# Patient Record
Sex: Female | Born: 1955
Health system: Southern US, Community
[De-identification: ages and names within clinical notes are randomized; demographics above are authoritative.]

## PROBLEM LIST (undated history)

## (undated) DIAGNOSIS — I10 Essential (primary) hypertension: Secondary | ICD-10-CM

## (undated) DIAGNOSIS — F419 Anxiety disorder, unspecified: Secondary | ICD-10-CM

## (undated) DIAGNOSIS — E785 Hyperlipidemia, unspecified: Secondary | ICD-10-CM

## (undated) DIAGNOSIS — R1310 Dysphagia, unspecified: Secondary | ICD-10-CM

## (undated) DIAGNOSIS — E781 Pure hyperglyceridemia: Secondary | ICD-10-CM

## (undated) DIAGNOSIS — T7840XA Allergy, unspecified, initial encounter: Secondary | ICD-10-CM

## (undated) DIAGNOSIS — I2510 Atherosclerotic heart disease of native coronary artery without angina pectoris: Secondary | ICD-10-CM

## (undated) DIAGNOSIS — R490 Dysphonia: Secondary | ICD-10-CM

## (undated) DIAGNOSIS — R9439 Abnormal result of other cardiovascular function study: Secondary | ICD-10-CM

## (undated) DIAGNOSIS — C801 Malignant (primary) neoplasm, unspecified: Secondary | ICD-10-CM

## (undated) HISTORY — PX: OTHER SURGICAL HISTORY: SHX169

## (undated) HISTORY — DX: Malignant (primary) neoplasm, unspecified: C80.1

## (undated) HISTORY — DX: Atherosclerotic heart disease of native coronary artery without angina pectoris: I25.10

## (undated) HISTORY — DX: Hyperlipidemia, unspecified: E78.5

## (undated) HISTORY — DX: Pure hyperglyceridemia: E78.1

## (undated) HISTORY — PX: COLON RESECTION: SHX5231

## (undated) HISTORY — PX: TONSILLECTOMY: SUR1361

## (undated) HISTORY — PX: COLONOSCOPY: SHX174

## (undated) HISTORY — DX: Dysphagia, unspecified: R13.10

## (undated) HISTORY — PX: TOTAL ABDOMINAL HYSTERECTOMY: SHX209

## (undated) HISTORY — DX: Dysphonia: R49.0

## (undated) HISTORY — DX: Anxiety disorder, unspecified: F41.9

## (undated) HISTORY — PX: KNEE SURGERY: SHX244

## (undated) HISTORY — DX: Allergy, unspecified, initial encounter: T78.40XA

## (undated) HISTORY — DX: Abnormal result of other cardiovascular function study: R94.39

## (undated) HISTORY — PX: VAGINAL HYSTERECTOMY: SUR661

---

## 1997-09-20 ENCOUNTER — Other Ambulatory Visit: Admission: RE | Admit: 1997-09-20 | Discharge: 1997-09-20 | Payer: Self-pay | Admitting: *Deleted

## 1998-07-25 ENCOUNTER — Other Ambulatory Visit: Admission: RE | Admit: 1998-07-25 | Discharge: 1998-07-25 | Payer: Self-pay | Admitting: *Deleted

## 2001-05-17 ENCOUNTER — Encounter: Admission: RE | Admit: 2001-05-17 | Discharge: 2001-06-13 | Payer: Self-pay | Admitting: Orthopedic Surgery

## 2001-10-26 ENCOUNTER — Other Ambulatory Visit: Admission: RE | Admit: 2001-10-26 | Discharge: 2001-10-26 | Payer: Self-pay | Admitting: *Deleted

## 2002-04-07 ENCOUNTER — Encounter: Payer: Self-pay | Admitting: *Deleted

## 2002-04-13 ENCOUNTER — Encounter (INDEPENDENT_AMBULATORY_CARE_PROVIDER_SITE_OTHER): Payer: Self-pay | Admitting: Specialist

## 2002-04-13 ENCOUNTER — Observation Stay (HOSPITAL_COMMUNITY): Admission: RE | Admit: 2002-04-13 | Discharge: 2002-04-14 | Payer: Self-pay | Admitting: *Deleted

## 2002-11-06 ENCOUNTER — Other Ambulatory Visit: Admission: RE | Admit: 2002-11-06 | Discharge: 2002-11-06 | Payer: Self-pay | Admitting: *Deleted

## 2003-11-07 ENCOUNTER — Other Ambulatory Visit: Admission: RE | Admit: 2003-11-07 | Discharge: 2003-11-07 | Payer: Self-pay | Admitting: *Deleted

## 2004-12-10 ENCOUNTER — Other Ambulatory Visit: Admission: RE | Admit: 2004-12-10 | Discharge: 2004-12-10 | Payer: Self-pay | Admitting: *Deleted

## 2008-02-24 ENCOUNTER — Ambulatory Visit (HOSPITAL_COMMUNITY): Admission: RE | Admit: 2008-02-24 | Discharge: 2008-02-24 | Payer: Self-pay | Admitting: Family Medicine

## 2010-06-22 ENCOUNTER — Encounter: Payer: Self-pay | Admitting: Family Medicine

## 2010-10-17 NOTE — Discharge Summary (Signed)
   NAMEDEDRA, [name]                           ACCOUNT NO.:  [id number]   MEDICAL RECORD NO.:  [id number]                   PATIENT TYPE:  OBV   LOCATION:  0452                                 FACILITY:  [facility]   PHYSICIAN:  Almedia Balls. Fore, M.D.                DATE OF BIRTH:  1955-07-25   DATE OF ADMISSION:  04/13/2002  DATE OF DISCHARGE:  04/14/2002                                 DISCHARGE SUMMARY   HISTORY:  The patient is a 55 year old with abnormal uterine bleeding,  pelvic adhesions, history of endometriosis, left tubal cyst for hysterectomy  and indicated procedures on April 13, 2002.  The remainder of her history  and physical are as previously dictated.   LABORATORY DATA:  Include preoperative hemoglobin 14.1, 10,200 white blood  cells with normal differential.  Chest x-ray showed chronic bronchitis  changes.   HOSPITAL COURSE:  The patient was taken to the operating room on November 13  at which time abdominal supracervical hysterectomy, extensive lysis of  adhesions, left salpingectomy, and destruction of right ovarian cysts were  performed.  The patient did well postoperatively.  Diet and ambulation were  progressed over the evening of November 13 and early morning of November 14.  On the morning of November 14 she was afebrile, having had a slight  temperature elevation in the range of 100 degrees in the early a.m., and  experiencing no problems except for pain which was controlled with  analgesics and it was felt that she could be discharged at this time.   FINAL DIAGNOSES:  1. Abnormal uterine bleeding.  2. Uterine enlargement.  3. Pelvic adhesions.  4. Ovarian cysts.  5. Left hydrosalpinx.  6. History of endometriosis.   OPERATION:  1. Abdominal supracervical hysterectomy.  2. Left salpingectomy.  3. Extensive lysis of adhesions.  4. Destruction of right ovarian cysts.   PATHOLOGY:  Report unavailable at time of dictation.   DISPOSITION:  Discharged  home to return to the office in two weeks for  follow-up.  She was instructed to gradually progress her activities over  several weeks at home and to limit lifting and driving for two weeks.  She  was given prescriptions for [facility] number 30 to be taken one or two  q.4h. p.r.n. pain and doxycycline 100 mg number 12 to use one b.i.d.  She  will call for any problems.                                               Almedia Balls. Randell Patient, M.D.    SRF/MEDQ  D:  04/14/2002  T:  04/14/2002  Job:  [id number]

## 2010-10-17 NOTE — H&P (Signed)
[name], [name]                           ACCOUNT NO.:  [id number]   MEDICAL RECORD NO.:  [id number]                   PATIENT TYPE:  INP   LOCATION:  NA                                   FACILITY:  [facility]   PHYSICIAN:  Almedia Balls. Fore, M.D.                DATE OF BIRTH:  07/05/1955   DATE OF ADMISSION:  04/13/2002  DATE OF DISCHARGE:                                HISTORY & PHYSICAL   CHIEF COMPLAINT:  Abnormal bleeding, pain and uterine enlargement.   HISTORY OF PRESENT ILLNESS:  The patient is a 55 year old with the above-  noted problems who has been counseled as to the need for surgery to treat  these problems.  She has undergone hysteroscopy, D&C, laparoscopy in  September 2003, with ovarian cysts, uterine fibroids and endometriosis.  Endometrial curettings were benign.  Pap smear was normal in May 2003.  The  patient has been counseled fully as to the nature of the procedure and the  risks involved included risks of anesthesia, injury to bowel, bladder, blood  vessels, ureters, postoperative hemorrhage, infection, recuperation and  possible hormone therapies should her ovaries be removed.  She fully  understands all these considerations and wishes to proceed on April 13, 2002.   PAST MEDICAL HISTORY:  Includes the above-noted problems with tubal ligation  in the past.   MEDICATIONS:  She has taken only analgesics and occasional progestational  agents to improve her bleeding patterns.   ALLERGIES:  No known drug allergies.   SOCIAL HISTORY:  She smokes approximately one pack of cigarettes per day.  She drinks occasional alcohol.   FAMILY HISTORY:  Noncontributory.   REVIEW OF SYMPTOMS:  HEENT:  Wears glasses.  CARDIORESPIRATORY:  Negative.  GASTROINTESTINAL:  Negative.  GENITOURINARY:  As in history of present  illness.  NEUROMUSCULAR:  Negative.   PHYSICAL EXAMINATION:  VITAL SIGNS:  Height 5 feet 3 inches, weight 154  pounds.  Blood pressure 120/76, pulse  76, respirations 18.  GENERAL:  Well-developed, white female in no acute distress.  HEENT:  Within normal limits.  NECK:  Supple without masses, adenopathy or bruits.  HEART:  Regular rate and rhythm without murmurs.  LUNGS:  Slightly coarse, but no definite rales or rhonchi noted.  BREASTS:  Sitting and lying without mass.  Axilla negative.  ABDOMEN:  Soft with no mass or tenderness.  PELVIC:  EG/BUS within normal limits.  Cervix is slightly inflamed.  Uterus  is enlarged to approximately 10 weeks' gestational size and irregular.  There are no palpable adnexal masses.  Anterior and posterior cul-de-sac  exam was confirmatory.  EXTREMITIES:  Within normal limits.  NEUROLOGIC:  Central nervous system grossly intact.  SKIN:  Without suspicious lesions.   IMPRESSION:  1. Abnormal uterine bleeding.  2. Uterine enlargement, questionable secondary to myomata.  3. Pelvic pain.  4. Endometriosis.   PLAN:  As  noted above.                                                Almedia Balls. Randell Patient, M.D.    SRF/MEDQ  D:  04/10/2002  T:  04/10/2002  Job:  [id number]

## 2010-10-17 NOTE — Op Note (Signed)
[name], [name]                           ACCOUNT NO.:  [id number]   MEDICAL RECORD NO.:  [id number]                   PATIENT TYPE:  INP   LOCATION:  NA                                   FACILITY:  [facility]   PHYSICIAN:  Almedia Balls. Fore, M.D.                DATE OF BIRTH:  07-18-1955   DATE OF PROCEDURE:  04/13/2002  DATE OF DISCHARGE:                                 OPERATIVE REPORT   PREOPERATIVE DIAGNOSES:  1. Abnormal uterine bleeding.  2. Pelvic pain.  3. Uterine enlargement.  4. Left hydrosalpinx.  5. Pelvic adhesions.  6. Question endometriosis.   POSTOPERATIVE DIAGNOSES:  1. Abnormal uterine bleeding.  2. Pelvic pain.  3. Uterine enlargement.  4. Left hydrosalpinx.  5. Pelvic adhesions.  6. Question endometriosis.   Pending pathology.   OPERATION:  1. Abdominal supracervical hysterectomy  2. Left salpingectomy.  3. Lysis of adhesions.   ANESTHESIA:  General oral tracheal.   SURGEON:  Almedia Balls. Randell Patient, M.D.   FIRST ASSISTANT:  Leatha Gilding. Mezer, M.D.   INDICATIONS FOR PROCEDURE:  The patient is a 55 year old with the above  noted problems who was counselled as to the need for surgery to treat these  problems. She was fully counselled as to the type of surgery to be performed  and risks involved to include risks of anesthesia, injury to bowel, bladder,  blood vessels, ureters, postoperative hemorrhage, infection, and  recuperation and possible use of hormone replacement should her ovaries be  removed. She fully understands all these considerations and wishes to  proceed on April 13, 2002.   OPERATIVE FINDINGS:  On entry into the abdomen, the uterus was enlarged to  approximately 8-[redacted] weeks gestational size with multiple myomata present.  There was a large hydrosalpinx involving the left tube.  There were  adhesions involving the left adnexal structures in the descending  rectosigmoid. The right adnexal structures were likewise adherent to the  posterolateral peritoneal surface.   DESCRIPTION OF PROCEDURE:  With the patient under general anesthesia,  prepared and draped in the usual sterile fashion, with a Foley catheter in  the bladder, a lower abdominal transverse incision was made and carried into  the peritoneal cavity. A self retaining retractor was placed. Exploration of  the upper abdomen revealed all of the upper abdominal viscera to be normal  to palpation. The bowel was then packed off, and Kelly clamps were used to  clamp the uterine ovarian anastomoses, tubes and round ligaments  bilaterally. Both round ligaments were transected using Bovie  electrocoagulation for the development of a bladder flap anteriorly. The  bladder flap was quite adherent to the lower uterine segment and cervix felt  to be secondary to probable adenomyosis. The uterine ovarian anastomosis  bilaterally was then clamped, cut, and doubly ligated with #1 chromic  catgut. The left tube was excised using Bovie electrocoagulation. Adhesions  were  lysed using Bovie electrocoagulation as well with good hemostasis. Care  was taken to avoid other viscera to include bowel and ureter in the area of  the adhesions. The uterine vessels were then skeletonized, and Heaney clamps  were used to clamp these structures. They were then cut and suture ligated  with #1 chromic catgut. The bladder was advanced over the lower uterine  segment and cervix, and Heaney clamps were used to clamp the cardinal  ligaments bilaterally which were then cut and suture ligated with #1 chromic  catgut. It was then possible to excise the uterine fundus using  Bovie  electrocoagulation to do a reverse conization of the cervix to remove the  majority of the endocervical canal. The remaining portion of the  endocervical canal was likewise rendered hemostatic with Bovie  electrocoagulation. Interrupted figure-of-eight sutures were used to  reapproximate the cervical stump with good  hemostasis. The area was then  lavaged with copious amounts of lactated Ringer's solution, and after noting  that hemostasis was maintained in the operative area, the area was  reperitonealized with an interrupted suture of #1 chromic catgut. The  ovaries were suspended from the round ligaments bilaterally. Again after  noting that hemostasis was maintained and that sponge and instrument counts  were correct, the peritoneum was closed with a continuous suture of #0  Vicryl. The fascia was closed with two sutures of #0 Vicryl which were  brought from the lateral aspects of the incision and tied separately in the  midline. The subcutaneous fat was reapproximated with interrupted sutures of  #0 Vicryl. The skin was closed with a subcuticular suture of 3-0 plain  catgut. Estimated blood loss 150 ml. The patient was taken to the recovery  room in good condition with clear urine and a Foley catheter tubing. She  will be placed on 23 hour observation following surgery.                                                Almedia Balls. Randell Patient, M.D.    SRF/MEDQ  D:  04/13/2002  T:  04/13/2002  Job:  [id number]   cc:   Leatha Gilding. Mezer, M.D.  [address]  [place]  [place] [address]  Fax: [phone]

## 2011-06-02 DIAGNOSIS — C801 Malignant (primary) neoplasm, unspecified: Secondary | ICD-10-CM

## 2011-06-02 HISTORY — DX: Malignant (primary) neoplasm, unspecified: C80.1

## 2012-02-23 ENCOUNTER — Other Ambulatory Visit: Payer: Self-pay | Admitting: Otolaryngology

## 2012-02-23 DIAGNOSIS — D380 Neoplasm of uncertain behavior of larynx: Secondary | ICD-10-CM

## 2012-02-24 ENCOUNTER — Other Ambulatory Visit: Payer: Self-pay | Admitting: Oncology

## 2012-02-24 ENCOUNTER — Ambulatory Visit
Admission: RE | Admit: 2012-02-24 | Discharge: 2012-02-24 | Disposition: A | Discharge status: Home / Self Care | Payer: Managed Care, Other (non HMO) | Source: Ambulatory Visit | Attending: Otolaryngology | Admitting: Otolaryngology

## 2012-02-24 DIAGNOSIS — D380 Neoplasm of uncertain behavior of larynx: Secondary | ICD-10-CM

## 2012-02-24 MED ORDER — IOHEXOL 300 MG/ML  SOLN
75.0000 mL | Freq: Once | INTRAMUSCULAR | Status: AC | PRN
  Administered 2012-02-24: 75 mL via INTRAVENOUS

## 2012-02-25 ENCOUNTER — Other Ambulatory Visit: Payer: Self-pay

## 2012-02-25 DIAGNOSIS — C329 Malignant neoplasm of larynx, unspecified: Secondary | ICD-10-CM | POA: Insufficient documentation

## 2012-02-25 DIAGNOSIS — C801 Malignant (primary) neoplasm, unspecified: Secondary | ICD-10-CM

## 2012-02-25 HISTORY — DX: Malignant (primary) neoplasm, unspecified: C80.1

## 2012-03-02 NOTE — Progress Notes (Signed)
Received office notes from Dr. Karol Wolicki @ [facility]; forwarded to Dr. Ha. 

## 2012-03-04 ENCOUNTER — Other Ambulatory Visit (HOSPITAL_COMMUNITY): Payer: Self-pay | Admitting: Otolaryngology

## 2012-03-04 DIAGNOSIS — C801 Malignant (primary) neoplasm, unspecified: Secondary | ICD-10-CM

## 2012-03-07 ENCOUNTER — Telehealth: Payer: Self-pay | Admitting: Oncology

## 2012-03-07 NOTE — Telephone Encounter (Signed)
S/W pt husband in re NP appt 10/09 @ 3 w/Dr. Gaylyn Rong Referring Dr. Lazarus Salines NP packet will be given on appt date.

## 2012-03-08 ENCOUNTER — Encounter: Payer: Self-pay | Admitting: Oncology

## 2012-03-08 ENCOUNTER — Encounter: Payer: Self-pay | Admitting: *Deleted

## 2012-03-08 DIAGNOSIS — R1310 Dysphagia, unspecified: Secondary | ICD-10-CM | POA: Insufficient documentation

## 2012-03-08 DIAGNOSIS — C760 Malignant neoplasm of head, face and neck: Secondary | ICD-10-CM | POA: Insufficient documentation

## 2012-03-09 ENCOUNTER — Telehealth: Payer: Self-pay | Admitting: Oncology

## 2012-03-09 ENCOUNTER — Ambulatory Visit: Payer: Managed Care, Other (non HMO)

## 2012-03-09 ENCOUNTER — Ambulatory Visit (HOSPITAL_BASED_OUTPATIENT_CLINIC_OR_DEPARTMENT_OTHER): Payer: Managed Care, Other (non HMO) | Admitting: Oncology

## 2012-03-09 ENCOUNTER — Ambulatory Visit: Payer: Managed Care, Other (non HMO) | Admitting: Radiation Oncology

## 2012-03-09 ENCOUNTER — Encounter: Payer: Self-pay | Admitting: *Deleted

## 2012-03-09 ENCOUNTER — Other Ambulatory Visit (HOSPITAL_BASED_OUTPATIENT_CLINIC_OR_DEPARTMENT_OTHER): Payer: Managed Care, Other (non HMO) | Admitting: Lab

## 2012-03-09 ENCOUNTER — Encounter: Payer: Self-pay | Admitting: Oncology

## 2012-03-09 VITALS — BP 149/94 | HR 84 | Temp 98.4°F | Resp 20 | Ht 62.0 in | Wt 171.1 lb

## 2012-03-09 DIAGNOSIS — F411 Generalized anxiety disorder: Secondary | ICD-10-CM

## 2012-03-09 DIAGNOSIS — F17200 Nicotine dependence, unspecified, uncomplicated: Secondary | ICD-10-CM

## 2012-03-09 DIAGNOSIS — C321 Malignant neoplasm of supraglottis: Secondary | ICD-10-CM

## 2012-03-09 DIAGNOSIS — R1310 Dysphagia, unspecified: Secondary | ICD-10-CM

## 2012-03-09 DIAGNOSIS — C760 Malignant neoplasm of head, face and neck: Secondary | ICD-10-CM

## 2012-03-09 DIAGNOSIS — F419 Anxiety disorder, unspecified: Secondary | ICD-10-CM

## 2012-03-09 HISTORY — DX: Anxiety disorder, unspecified: F41.9

## 2012-03-09 LAB — COMPREHENSIVE METABOLIC PANEL (CC13)
AST: 9 U/L (ref 5–34)
Albumin: 3.9 g/dL (ref 3.5–5.0)
Alkaline Phosphatase: 85 U/L (ref 40–150)
BUN: 8 mg/dL (ref 7.0–26.0)
Calcium: 9.4 mg/dL (ref 8.4–10.4)
Creatinine: 0.8 mg/dL (ref 0.6–1.1)
Glucose: 105 mg/dl — ABNORMAL HIGH (ref 70–99)

## 2012-03-09 LAB — CBC WITH DIFFERENTIAL/PLATELET
Basophils Absolute: 0 10*3/uL (ref 0.0–0.1)
EOS%: 0.1 % (ref 0.0–7.0)
Eosinophils Absolute: 0 10*3/uL (ref 0.0–0.5)
HCT: 39.4 % (ref 34.8–46.6)
HGB: 13.3 g/dL (ref 11.6–15.9)
LYMPH%: 20.2 % (ref 14.0–49.7)
MCH: 29.6 pg (ref 25.1–34.0)
MCV: 87.7 fL (ref 79.5–101.0)
MONO%: 5 % (ref 0.0–14.0)
NEUT#: 12.7 10*3/uL — ABNORMAL HIGH (ref 1.5–6.5)
NEUT%: 74.5 % (ref 38.4–76.8)
Platelets: 299 10*3/uL (ref 145–400)

## 2012-03-09 MED ORDER — ALPRAZOLAM 0.25 MG PO TABS: 0.2500 mg | ORAL_TABLET | Freq: Every evening | ORAL | Status: DC | PRN

## 2012-03-09 NOTE — Patient Instructions (Addendum)
1.  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 3.  Treatment: (assuming no metastatic disease)  *   BEST way to achieve cure is upfront laryngectomy (with Dr. Lazarus Salines) followed by radiation afterward with possible chemo as well.  *   Alternatively, upfront chemoradiation and reserve surgery if there is still cancer afterward.  Note that the chance of chemoradiation curing this type of cancer without surgery is very low.

## 2012-03-09 NOTE — Progress Notes (Signed)
[facility]  Telephone:([phone] Fax:([phone]   MEDICAL ONCOLOGY - INITIAL CONSULATION    Referral MD:  Dr. Flo Shanks, M.D.   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   HPI:  [name]. [name] is a 56 year-old woman with history of smoking.  She had had hoarse voice for many year from laryngeal polyps.  For the past 5 months, she has had abnormal sensation in her throat when she eats solid foods.  She was referred to Dr. Lazarus Salines who performed direct laryngoscopy on 02/25/2012.  There was an exophytic tumor of the laryngeal surface of the epiglottis very close to the anterior commissure; some involvement of the left AE fold (more than the right); vocal cords with polypoid changes; airway was intact.  Biopsy of the mass (path case # [id number]) showed high grade mocoepidermoid carcinoma.  She was kindly referred to the [facility] for evaluation.  [name] presented to the clinic for the first time today with her husband, 3 sisters, and a brother-in-law.  She has hoarse voice and solid foods dysphagia as mentioned.  She denied aspiration.  She has bilateral neck palpable nodes (right larger than left).  She has anxiety with this diagnosis; and has not been able to sleep at all.  She did not like Trazadone or Ambien in the past since they made her very sleepy the next day.   She had been having productive cough.  Her PCP gave her a course of Levoquin and steroid with improvement of her symptoms.   Patient denies fever, anorexia, weight loss, fatigue, headache, visual changes, confusion, drenching night sweats, mucositis, nausea vomiting, jaundice, chest pain, palpitation, gum bleeding, epistaxis, hematemesis, hemoptysis, abdominal pain, abdominal swelling, early satiety, melena, hematochezia, hematuria, skin rash, spontaneous bleeding, joint swelling, joint pain, heat or cold intolerance, bowel bladder incontinence,  back pain, focal motor weakness, paresthesia, depression, suicidal or homicidal ideation, feeling hopelessness.    Past Medical History  Diagnosis Date  . 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 Chronic hoarseness     years  . Allergy     sulfa  . Dysphagia   . Hypertriglyceridemia   . Anxiety 03/09/2012  :  Past Surgical History  Procedure Date  . Knee surgery   . Oral surgery tooth extraction   . Tonsillectomy   . Vaginal hysterectomy   :  Current Outpatient Prescriptions  Medication Sig Dispense Refill  . HYDROcodone-homatropine (HYCODAN) 5-1.5 MG/5ML syrup Take by mouth At bedtime as needed. 1 to 2 teaspoons at bedtime for cough      . levofloxacin (LEVAQUIN) 500 MG tablet Take 500 mg by mouth Daily. For one week      . predniSONE (DELTASONE) 10 MG tablet Take 10 mg by mouth as directed. Taper as directed.      . VENTOLIN HFA 108 (90 BASE) MCG/ACT inhaler Inhale 2 puffs into the lungs Every 4 hours as needed.      . ALPRAZolam (XANAX) 0.25 MG tablet Take 1 tablet (0.25 mg total) by mouth at bedtime as needed for sleep or anxiety.  30 tablet  0     Allergies  Allergen Reactions  . Sulfa Antibiotics     Unable to recall  :  Family History  Problem Relation Age of Onset  . Cancer Mother 3    breast   . Heart attack Father   . Heart  failure Father   . Cancer Maternal Aunt 40    pancreas  . Cancer Maternal Grandfather     lung  :  History   Social History  . Marital Status: Married    Spouse Name: N/A    Number of Children: 1  . Years of Education: N/A   Occupational History  .  [employer]   Social History Main Topics  . Smoking status: Current Every Day Smoker -- 0.2 packs/day for 40 years    Types: Cigarettes  . Smokeless tobacco: Not on file  . Alcohol Use: Yes  . Drug Use: No  . Sexually Active:    Other Topics Concern  . Not on file   Social History Narrative  .  No narrative on file  :  Pertinent items are noted in HPI.  Exam: ECOG 0.   General:  well-nourished woman, in no acute distress.  Eyes:  no scleral icterus.  ENT:  There were no oropharyngeal lesions.  Neck was without thyromegaly.  Lymphatics:  Positive for palpable right level II node about 3cm. There was no clear supraclavicular or axillary adenopathy.  Respiratory: lungs were clear bilaterally without wheezing or crackles.  Cardiovascular:  Regular rate and rhythm, S1/S2, without murmur, rub or gallop.  There was no pedal edema.  GI:  abdomen was soft, flat, nontender, nondistended, without organomegaly.  Muscoloskeletal:  no spinal tenderness of palpation of vertebral spine.  Skin exam was without echymosis, petichae.  Neuro exam was nonfocal.  Patient was able to get on and off exam table without assistance.  Gait was normal.  Patient was alerted and oriented.  Attention was good.   Language was appropriate.  Mood was normal without depression.  Speech was not pressured.  Thought content was not tangential.     Lab Results  Component Value Date   WBC 17.0* 03/09/2012   HGB 13.3 03/09/2012   HCT 39.4 03/09/2012   PLT 299 03/09/2012   GLUCOSE 105* 03/09/2012   ALT 16 03/09/2012   AST 9 03/09/2012   NA 139 03/09/2012   K 3.7 03/09/2012   CL 105 03/09/2012   CREATININE 0.8 03/09/2012   BUN 8.0 03/09/2012   CO2 21* 03/09/2012   IMAGING:  I personally reviewed the following CT and showed the images to the patient and his relatives.   Ct Soft Tissue Neck W Contrast  02/24/2012  *RADIOLOGY REPORT*  Clinical Data: 56 year old female with hoarseness and sore throat. Dysphagia.  Larynx neoplasm of uncertain behavior.  CT NECK WITH CONTRAST  Technique:  Multidetector CT imaging of the neck was performed with intravenous contrast.  Contrast: 75mL OMNIPAQUE IOHEXOL 300 MG/ML  SOLN In conjunction with CT(s) of the chest which is(are) reported separately.  Comparison: Thyroid ultrasound 02/24/2008.   Findings: There is a lobulated hyperenhancing mass with a fungating growth pattern extending from the tongue base in the midline caudally through the vallecula, and involving the epiglottis to the level of the aryepiglottic folds.  No normal epiglottic contour is identified.  It is possible that the appearance represent a mass within the epiglottis with lingual tonsil hypertrophy, but back consideration is secondary.  Overall, the abnormal area encompasses the 16 x 33 x 45 mm and extends across midline fairly equally.  At least a portion of both aryepiglottic folds appear abnormal.  The piriform sinuses do remain patent.  No glottic or subglottic tumor is identified.  There is loss of the pre-epiglottic fat on the left as  such that the mass is inseparable from the left strap muscles (series 2 image 108).  No definite laryngeal cartilage involvement.  The bilateral abnormal level IIA lymph nodes, on the right to nodes measure 15-16 mm short axis of PE.  On the left there is a 13 mm short axis node.  No level I or level III lymphadenopathy.  Parapharyngeal, retropharyngeal (retropharyngeal right carotid incidentally noted), and sublingual spaces are within normal limits.  Submandibular and parotid glands are within normal limits. Negative visualized orbit soft tissues and brain parenchyma.  Major vascular structures in the neck are patent.  Negative thyroid. No acute osseous abnormality identified.  Visualized paranasal sinuses and mastoids are clear.  Chest reported separately.  IMPRESSION: 1.  Lobulated, hyperenhancing, midline mass which seems to involve both the tongue base at the level of the lingual tonsil and the entire epiglottis with extension to the aryepiglottic folds.  This might be an epiglottic (supraglottic laryngeal) carcinoma with secondary extension into the vallecula.  There is loss of the pre- epiglottic fat on the left with likely left strap muscle involvement. 2.  Bilateral pathologic level IIA  lymph nodes, more numerous on the right and measuring up to 16 mm apiece. 3.  No glottic or subglottic involvement.   Original Report Authenticated By: Harley Hallmark, M.D.    Ct Chest W Contrast  02/24/2012  *RADIOLOGY REPORT*  Clinical Data: Laryngeal carcinoma.  CT CHEST WITH CONTRAST  Technique:  Multidetector CT imaging of the chest was performed following the standard protocol during bolus administration of intravenous contrast.  Contrast: 75mL OMNIPAQUE IOHEXOL 300 MG/ML  SOLN  Comparison: None.  Findings: No pathologically enlarged mediastinal, hilar or axillary lymph nodes.  Heart size normal.  No pericardial effusion.  A subpleural nodular density in the posterior segment right upper lobe measures 3 mm (image 18), nonspecific.  3 mm subpleural density in the apical right upper lobe (image 7) is also nonspecific.  Lungs are otherwise clear.  No pleural fluid.  Airway is unremarkable.  Incidental imaging of the upper abdomen shows an ill-defined low attenuation lesion in the right hepatic lobe, measuring 1.5 cm.  A small low attenuation lesion is seen in the left hepatic lobe, measuring 11 mm.  No worrisome lytic or sclerotic lesions.  IMPRESSION:  1.  Tiny subpleural right upper lobe nodules are nonspecific. Metastatic disease is considered very unlikely. Follow-up could be performed in 3-6 months to ensure stability, as clinically indicated. 2.  Ill-defined low attenuation lesions in the liver are indeterminate.  Further evaluation with MR abdomen without and with contrast could be performed, as clinically indicated.   Original Report Authenticated By: Reyes Ivan, M.D.     Assessment and Plan:   1.  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  A PET scan is scheduled for 03/15/2012 to rule out distant met.  3.  Treatment: (assuming no metastatic disease)  *   BEST way to achieve cure is upfront laryngectomy (with Dr. Lazarus Salines) followed by  radiation afterward with possible chemo as well.  *   Alternatively, upfront chemoradiation and reserve surgery if there is still cancer afterward.  Note that the chance of chemoradiation curing this type of cancer without surgery is very low.    4.  If there is distal met, I may refer her to [facility] for a clinical trial with Dr. Elaina Pattee.   5.  She was very  hesitating at first about trach/PEG tubes.  I advised her that despite a trach, patients will have very normal quality of life.  She would like to think this over and talk with Dr. Lazarus Salines again.   6.  Smoking:  I advised her on electronic cigarettes so that she can stop smoking for at least 2 weeks prior to laryngectomy.   7.  Anxiety:  I gave her a Rx for Xanax 0.25mg  PO qhs prn.  She did not want Trazodone or Ambien.   8.  Follow up:  After laryngectomy.     Thank you for this referral.     The length of time of the face-to-face encounter was 60 minutes. More than 50% of time was spent counseling and coordination of care.

## 2012-03-09 NOTE — Telephone Encounter (Signed)
C/D 03/09/12 for appt 03/09/12

## 2012-03-09 NOTE — Progress Notes (Signed)
02/25/12: Epiglottis biopsy,laryngeal surface=poorly differentiated carcinoma, consistent with high grade mucoepidermoid carcinoma, bilateral neck adenopathy  Hoarseness chronic years, dysphasia   Allergies: Sulfa

## 2012-03-10 ENCOUNTER — Encounter: Payer: Self-pay | Admitting: *Deleted

## 2012-03-15 ENCOUNTER — Ambulatory Visit (HOSPITAL_COMMUNITY)
Admission: RE | Admit: 2012-03-15 | Discharge: 2012-03-15 | Disposition: A | Discharge status: Home / Self Care | Payer: Managed Care, Other (non HMO) | Source: Ambulatory Visit | Attending: Otolaryngology | Admitting: Otolaryngology

## 2012-03-15 DIAGNOSIS — C760 Malignant neoplasm of head, face and neck: Secondary | ICD-10-CM | POA: Insufficient documentation

## 2012-03-15 DIAGNOSIS — C801 Malignant (primary) neoplasm, unspecified: Secondary | ICD-10-CM

## 2012-03-15 MED ORDER — FLUDEOXYGLUCOSE F - 18 (FDG) INJECTION
17.1000 | Freq: Once | INTRAVENOUS | Status: AC | PRN
  Administered 2012-03-15: 17.1 via INTRAVENOUS

## 2012-03-16 ENCOUNTER — Encounter: Payer: Self-pay | Admitting: Oncology

## 2012-03-16 ENCOUNTER — Telehealth: Payer: Self-pay | Admitting: *Deleted

## 2012-03-16 NOTE — Progress Notes (Signed)
I called and talked with [name] today.  Her PET scan did not show metastatic disease.  I strongly recommended upfront laryngectomy, neck dissection; followed by adjuvant radiation (with possible chemo depending on path finding).  This ensures the best chance of survival.  Upfront chemoradiation without resection does not give as high a chance of survival compared to upfront resection followed by adjuvant therapy.  She is still not sure about this.  She wanted time to think this over.  I strongly encouraged her to contact Dr. Lottie Dawson office to an appointment to go over resection (pros and cons).  I personally talked with Dr. Lazarus Salines today and asked him to see patient back in clinic.   I will see patient after resection.

## 2012-03-16 NOTE — Telephone Encounter (Signed)
Pt called to clarify Dr. Lodema Pilot message yesterday.  States he spoke to her daughter thinking it was pt herself.  States she believes her daughter misrepresented herself at her.  I explained to pt that Dr. Gaylyn Rong would never have knowingly spoke w/ her daughter about his recommendations.  He wanted to speak w/ her, the patient, and he was mislead into thinking he was speaking w/ pt.   Pt verbalized understanding.  I reiterated Dr. Gaylyn Rong recommends surgery asap for her cancer.  Pt states she will f/u w/ Dr. Lazarus Salines and is also looking into studies at [facility].  She verbalized understanding that Dr. Gaylyn Rong recommends Surgery first and asap.

## 2012-03-31 DIAGNOSIS — Z8589 Personal history of malignant neoplasm of other organs and systems: Secondary | ICD-10-CM | POA: Insufficient documentation

## 2012-03-31 DIAGNOSIS — C321 Malignant neoplasm of supraglottis: Secondary | ICD-10-CM | POA: Insufficient documentation

## 2012-06-01 DIAGNOSIS — C189 Malignant neoplasm of colon, unspecified: Secondary | ICD-10-CM

## 2012-06-01 HISTORY — DX: Malignant neoplasm of colon, unspecified: C18.9

## 2013-03-24 DIAGNOSIS — Z87891 Personal history of nicotine dependence: Secondary | ICD-10-CM | POA: Insufficient documentation

## 2013-05-01 ENCOUNTER — Encounter (HOSPITAL_COMMUNITY): Payer: Self-pay | Admitting: Emergency Medicine

## 2013-05-01 ENCOUNTER — Emergency Department (HOSPITAL_COMMUNITY)
Admission: EM | Admit: 2013-05-01 | Discharge: 2013-05-01 | Disposition: A | Discharge status: Home / Self Care | Payer: Managed Care, Other (non HMO) | Attending: Emergency Medicine | Admitting: Emergency Medicine

## 2013-05-01 DIAGNOSIS — F411 Generalized anxiety disorder: Secondary | ICD-10-CM | POA: Insufficient documentation

## 2013-05-01 DIAGNOSIS — R490 Dysphonia: Secondary | ICD-10-CM | POA: Insufficient documentation

## 2013-05-01 DIAGNOSIS — T8131XA Disruption of external operation (surgical) wound, not elsewhere classified, initial encounter: Secondary | ICD-10-CM

## 2013-05-01 DIAGNOSIS — F17200 Nicotine dependence, unspecified, uncomplicated: Secondary | ICD-10-CM | POA: Insufficient documentation

## 2013-05-01 DIAGNOSIS — Z8639 Personal history of other endocrine, nutritional and metabolic disease: Secondary | ICD-10-CM | POA: Insufficient documentation

## 2013-05-01 DIAGNOSIS — Z862 Personal history of diseases of the blood and blood-forming organs and certain disorders involving the immune mechanism: Secondary | ICD-10-CM | POA: Insufficient documentation

## 2013-05-01 DIAGNOSIS — G8911 Acute pain due to trauma: Secondary | ICD-10-CM | POA: Insufficient documentation

## 2013-05-01 DIAGNOSIS — Z85819 Personal history of malignant neoplasm of unspecified site of lip, oral cavity, and pharynx: Secondary | ICD-10-CM | POA: Insufficient documentation

## 2013-05-01 DIAGNOSIS — Y838 Other surgical procedures as the cause of abnormal reaction of the patient, or of later complication, without mention of misadventure at the time of the procedure: Secondary | ICD-10-CM | POA: Insufficient documentation

## 2013-05-01 DIAGNOSIS — Z8521 Personal history of malignant neoplasm of larynx: Secondary | ICD-10-CM | POA: Insufficient documentation

## 2013-05-01 DIAGNOSIS — Z79899 Other long term (current) drug therapy: Secondary | ICD-10-CM | POA: Insufficient documentation

## 2013-05-01 NOTE — ED Notes (Signed)
Dressing applied to lower abdomen for wound dehiscence.

## 2013-05-01 NOTE — ED Notes (Signed)
Pt had colon resection 11/5 and staples removed Tuesday the bottom half of the incision is open. Pt states her surgery was at [facility].

## 2013-05-01 NOTE — ED Provider Notes (Signed)
CSN: [id number]     Arrival date & time 05/01/13  2039 History  This chart was scribed for Dione Booze, MD by Bennett Scrape, ED Scribe. This patient was seen in room APA12/APA12 and the patient's care was started at 11:06 PM.   Chief Complaint  Patient presents with  . Abdominal Pain    The history is provided by the patient. No language interpreter was used.    HPI Comments: LINNETTE PANELLA is a 57 y.o. female who presents to the Emergency Department complaining of opening of an incision that occurred 5 minutes PTA. She reports that she had a colon resection surgery done on Nov. 5th at [facility]. The staples came out on the Nov. 18th and the butterfly bandages came off last week. She denies having any pain or other symptoms. She states that she was wearing tighter pants than she has been and noted some blood on her underwear. She denies any further drainage. She denies any other symptoms.    Past Medical History  Diagnosis Date  . 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 Chronic hoarseness     years  . Allergy     sulfa  . Dysphagia   . Hypertriglyceridemia   . Anxiety 03/09/2012   Past Surgical History  Procedure Laterality Date  . Knee surgery    . Oral surgery tooth extraction    . Tonsillectomy    . Vaginal hysterectomy    . Colon resection     Family History  Problem Relation Age of Onset  . Cancer Mother 98    breast   . Heart attack Father   . Heart failure Father   . Cancer Maternal Aunt 40    pancreas  . Cancer Maternal Grandfather     lung   History  Substance Use Topics  . Smoking status: Current Every Day Smoker -- 0.25 packs/day for 40 years    Types: Cigarettes  . Smokeless tobacco: Not on file  . Alcohol Use: Yes   No OB history provided.  Review of Systems  Skin: Positive for wound.  All other systems reviewed and are negative.    Allergies  Sulfa antibiotics  Home  Medications   Current Outpatient Rx  Name  Route  Sig  Dispense  Refill  . ALPRAZolam (XANAX) 0.25 MG tablet   Oral   Take 1 tablet (0.25 mg total) by mouth at bedtime as needed for sleep or anxiety.   30 tablet   0   . ascorbic acid (VITAMIN C) 1000 MG tablet   Oral   Take 2,000-4,000 mg by mouth every morning.          . Echinacea 450 MG CAPS   Oral   Take 1-2 capsules by mouth daily.          Marland Kitchen gabapentin (NEURONTIN) 100 MG capsule   Oral   Take 100 mg by mouth 3 (three) times daily.         Marland Kitchen HAWTHORNE PO   Oral   Take 1 tablet by mouth daily.          Marland Kitchen HYDROcodone-homatropine (HYCODAN) 5-1.5 MG/5ML syrup   Oral   Take 5-10 mLs by mouth daily as needed for cough. 1 to 2 teaspoons at bedtime for cough         . INOSITOL PO   Oral   Take 1 tablet by mouth daily. Also contains IP-6         .  milk thistle 175 MG tablet   Oral   Take 175 mg by mouth daily.         . Multiple Vitamin (MULTIVITAMIN) capsule   Oral   Take 1 capsule by mouth daily.         Marland Kitchen OVER THE COUNTER MEDICATION   Oral   Take 1 tablet by mouth daily. Elderberry 450 mg         . oxyCODONE (OXY IR/ROXICODONE) 5 MG immediate release tablet   Oral   Take 5-10 mg by mouth every 4 (four) hours as needed. For pain         . Phenyleph-CPM-DM-Aspirin (ALKA-SELTZER PLUS COLD & COUGH) 7.12-31-08-325 MG TBEF   Oral   Take 1 tablet by mouth daily as needed (for cold symptoms).         . pseudoephedrine-acetaminophen (TYLENOL SINUS) 30-500 MG TABS   Oral   Take 1 tablet by mouth as needed (for sinus headache).          Triage vitals: BP 143/89  Pulse 112  Temp(Src) 98.3 F (36.8 C) (Oral)  Resp 20  Ht 5' 2.5" (1.588 m)  Wt 150 lb (68.04 kg)  BMI 26.98 kg/m2  SpO2 100%  Physical Exam  Nursing note and vitals reviewed. Constitutional: She is oriented to person, place, and time. She appears well-developed and well-nourished. No distress.  HENT:  Head: Normocephalic and  atraumatic.  Eyes: EOM are normal.  Neck: Neck supple. No tracheal deviation present.  Cardiovascular: Normal rate.   Pulmonary/Chest: Effort normal. No respiratory distress.  Abdominal:  Surgical wound to midline with dehiscence of lower half. Underlying tissue appears viable without underlying infection or drainage.  Musculoskeletal: Normal range of motion.  Neurological: She is alert and oriented to person, place, and time.  Skin: Skin is warm and dry.  Psychiatric: She has a normal mood and affect. Her behavior is normal.    ED Course  Procedures (including critical care time)  DIAGNOSTIC STUDIES: Oxygen Saturation is 100% on room air, normal by my interpretation.    COORDINATION OF CARE: 11:10 PM-Informed pt that the wound can't be repaired using sutures and butterfly bandages. Discussed treatment plan which includes keeping clean and dry guaze with pt at bedside and pt stated that she wanted skin closure. Advised pt to call [facility] tomorrow to f/u or to go to [place] after discharge for a second opinion.    MDM   1. Wound dehiscence, initial encounter    Wound dehiscence without evidence of infection. Patient is advised that this will require local care with daily dressing changes. She is somewhat insistent that this issue with Steri-Strips a bite or sutures is stable since he is advised that this cannot be done until the wound has adequately healed and the only reopened. She is urged to followup with her surgeon and weak force to [facility]. She is told repeatedly that there is no treatment in the ED other than daily dressing changes and also advised that healing process is likely to take several months.   I personally performed the services described in this documentation, which was scribed in my presence. The recorded information has been reviewed and is accurate.      Dione Booze, MD 05/02/13 [phone]

## 2013-05-19 ENCOUNTER — Ambulatory Visit (INDEPENDENT_AMBULATORY_CARE_PROVIDER_SITE_OTHER): Payer: 59 | Admitting: Nurse Practitioner

## 2013-05-19 ENCOUNTER — Encounter: Payer: Self-pay | Admitting: Nurse Practitioner

## 2013-05-19 VITALS — BP 153/95 | HR 112 | Temp 97.3°F | Ht 60.0 in | Wt 144.0 lb

## 2013-05-19 DIAGNOSIS — R42 Dizziness and giddiness: Secondary | ICD-10-CM

## 2013-05-19 MED ORDER — MECLIZINE HCL 25 MG PO TABS: 25.0000 mg | ORAL_TABLET | Freq: Three times a day (TID) | ORAL | Status: DC | PRN

## 2013-05-19 NOTE — Patient Instructions (Signed)
Vertigo Vertigo means you feel like you or your surroundings are moving when they are not. Vertigo can be dangerous if it occurs when you are at work, driving, or performing difficult activities.  CAUSES  Vertigo occurs when there is a conflict of signals sent to your brain from the visual and sensory systems in your body. There are many different causes of vertigo, including:  Infections, especially in the inner ear.  A bad reaction to a drug or misuse of alcohol and medicines.  Withdrawal from drugs or alcohol.  Rapidly changing positions, such as lying down or rolling over in bed.  A migraine headache.  Decreased blood flow to the brain.  Increased pressure in the brain from a head injury, infection, tumor, or bleeding. SYMPTOMS  You may feel as though the world is spinning around or you are falling to the ground. Because your balance is upset, vertigo can cause nausea and vomiting. You may have involuntary eye movements (nystagmus). DIAGNOSIS  Vertigo is usually diagnosed by physical exam. If the cause of your vertigo is unknown, your caregiver may perform imaging tests, such as an MRI scan (magnetic resonance imaging). TREATMENT  Most cases of vertigo resolve on their own, without treatment. Depending on the cause, your caregiver may prescribe certain medicines. If your vertigo is related to body position issues, your caregiver may recommend movements or procedures to correct the problem. In rare cases, if your vertigo is caused by certain inner ear problems, you may need surgery. HOME CARE INSTRUCTIONS   Follow your caregiver's instructions.  Avoid driving.  Avoid operating heavy machinery.  Avoid performing any tasks that would be dangerous to you or others during a vertigo episode.  Tell your caregiver if you notice that certain medicines seem to be causing your vertigo. Some of the medicines used to treat vertigo episodes can actually make them worse in some people. SEEK  IMMEDIATE MEDICAL CARE IF:   Your medicines do not relieve your vertigo or are making it worse.  You develop problems with talking, walking, weakness, or using your arms, hands, or legs.  You develop severe headaches.  Your nausea or vomiting continues or gets worse.  You develop visual changes.  A family member notices behavioral changes.  Your condition gets worse. MAKE SURE YOU:  Understand these instructions.  Will watch your condition.  Will get help right away if you are not doing well or get worse. Document Released: 02/25/2005 Document Revised: 08/10/2011 Document Reviewed: 12/04/2010 Exit[employer] Patient Information 2014 [employer].  

## 2013-05-19 NOTE — Progress Notes (Signed)
   Subjective:    Patient ID: [name], female    DOB: 1955/06/23, 57 y.o.   MRN: [id number]  HPI  Patient woke up this morning with dizziness and headache- nausea since getting up this morning. Patient just had colon cancer surgery in November after having chemo for 2 months. Her incision eviserated and had to heal by secondary intentions. Otherwise she is okay. Patient Active Problem List   Diagnosis Date Noted  . Anxiety 03/09/2012  . Head and neck cancer 03/08/2012  . Dysphagia   . Cancer 02/25/2012   Outpatient Encounter Prescriptions as of 05/19/2013  Medication Sig  . ALPRAZolam (XANAX) 0.25 MG tablet Take 1 tablet (0.25 mg total) by mouth at bedtime as needed for sleep or anxiety.  Marland Kitchen ascorbic acid (VITAMIN C) 1000 MG tablet Take 2,000-4,000 mg by mouth every morning.   . Echinacea 450 MG CAPS Take 1-2 capsules by mouth daily.   Marland Kitchen gabapentin (NEURONTIN) 100 MG capsule Take 100 mg by mouth 3 (three) times daily.  Marland Kitchen HAWTHORNE PO Take 1 tablet by mouth daily.   Marland Kitchen HYDROcodone-homatropine (HYCODAN) 5-1.5 MG/5ML syrup Take 5-10 mLs by mouth daily as needed for cough. 1 to 2 teaspoons at bedtime for cough  . INOSITOL PO Take 1 tablet by mouth daily. Also contains IP-6  . milk thistle 175 MG tablet Take 175 mg by mouth daily.  . Multiple Vitamin (MULTIVITAMIN) capsule Take 1 capsule by mouth daily.  Marland Kitchen OVER THE COUNTER MEDICATION Take 1 tablet by mouth daily. Elderberry 450 mg  . oxyCODONE (OXY IR/ROXICODONE) 5 MG immediate release tablet Take 5-10 mg by mouth every 4 (four) hours as needed. For pain  . Phenyleph-CPM-DM-Aspirin (ALKA-SELTZER PLUS COLD & COUGH) 7.12-31-08-325 MG TBEF Take 1 tablet by mouth daily as needed (for cold symptoms).  . pseudoephedrine-acetaminophen (TYLENOL SINUS) 30-500 MG TABS Take 1 tablet by mouth as needed (for sinus headache).       Review of Systems  Constitutional: Positive for fatigue.  Respiratory: Negative.   Cardiovascular: Negative.     Gastrointestinal: Positive for nausea.       Belching   Genitourinary: Negative.   Neurological: Positive for dizziness and weakness.       Objective:   Physical Exam  Constitutional: She is oriented to person, place, and time. She appears well-developed and well-nourished.  Neurological: She is alert and oriented to person, place, and time. She has normal reflexes. No cranial nerve deficit.  Skin:  Abdominal incision healing by secondary intentions- no sign of infection    BP 153/95  Pulse 112  Temp(Src) 97.3 F (36.3 C) (Oral)  Ht 5' (1.524 m)  Wt 144 lb (65.318 kg)  BMI 28.12 kg/m2       Assessment & Plan:   1. Vertigo    Meds ordered this encounter  Medications  . meclizine (ANTIVERT) 25 MG tablet    Sig: Take 1 tablet (25 mg total) by mouth 3 (three) times daily as needed for dizziness.    Dispense:  30 tablet    Refill:  0    Order Specific Question:  Supervising Provider    Answer:  Deborra Medina   Force fluids Rise slowly from sitting or laying  Mary-Margaret Daphine Deutscher, [place]

## 2013-05-23 ENCOUNTER — Telehealth: Payer: Self-pay | Admitting: Nurse Practitioner

## 2013-05-23 NOTE — Telephone Encounter (Signed)
Spoke with patient.

## 2013-08-21 IMAGING — CT NM PET TUM IMG INITIAL (PI) SKULL BASE T - THIGH
1 of 3 series · 2 of 25 positions shown · non-contrast
Comparison: 02/24/2012

CLINICAL DATA: Initial treatment strategy for head and neck cancer.

NUCLEAR MEDICINE PET SKULL BASE TO THIGH
Fasting Blood Glucose:  115
TECHNIQUE: 17.1 mCi F-18 FDG was injected intravenously. CT data
was obtained and used for attenuation correction and anatomic
localization only.  (This was not acquired as a diagnostic CT
examination.) Additional exam technical data entered on
technologist worksheet. Dedicated PET imaging of the neck region
was also performed.  Any standard uptake value measurements in the
neck region are taken from these dedicated neck images.

[Series 2: ct neck · axial · 3.8mm · 0.98mm/px · z∈[-29,+0]mm · 2 of 91 slices shown]
[im 82/91  brain]
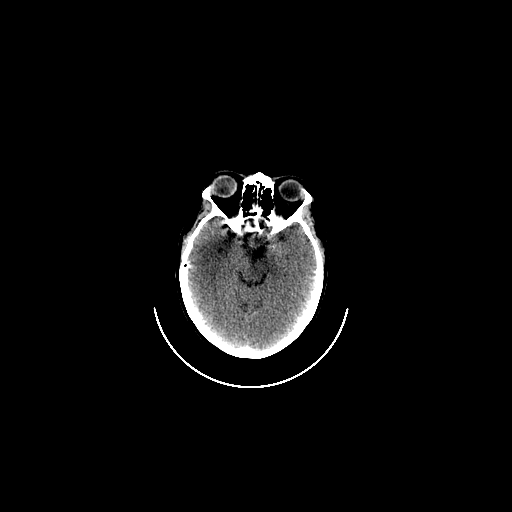
[im 91/91  brain]
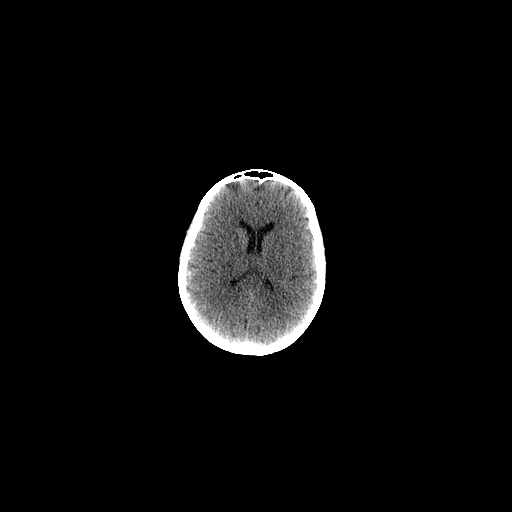

[2 of 25 positions shown; findings below may reference images not displayed]

FINDINGS: Neck: Tongue base and supraglottic mass shown on CT has a maximum
standard uptake value of 15.9, compatible with malignancy.

Right parotid gland lymph node with short axis measurement of
cm has a maximum standard uptake value of 5.3, suspicious for
malignant involvement.

A right station IIA lymph node with short axis diameter of 1.5 cm
has a maximum standard uptake value of 12.6, compatible with
malignancy.

Left station IIA lymph node with short axis diameter 0.9 cm has a
maximum standard uptake value of 4.7, borderline for malignancy.

Chronic bilateral maxillary sinusitis noted.

Chest:  6 mm right upper paratracheal lymph node is not
significantly hypermetabolic.  No abnormal hypermetabolic activity
is observed in the chest.  The previously referenced minuscule
subpleural nodules along the right upper lobe are not readily
apparent on the CT data and are too small to characterize by PET.

Abdomen/Pelvis:  1.5 cm cyst, right hepatic lobe.  No
hypermetabolic activity in the abdomen or pelvis to favor
malignancy in this location.

Skeleton:  Diffuse uptake of FDG throughout the skeleton may be
from marrow stimulation or may be incidental, and is not thought to
represent metastatic disease to the skeleton.
IMPRESSION: 1.  Hypermetabolic tongue base and supraglottic mass compatible
with malignancy, with a station to a hypermetabolic adenopathy on
the right and likely on the left, and with a hypermetabolic lymph
node in the infraauricular portion of the parotid gland.
2.  Chronic bilateral maxillary sinusitis.

## 2013-09-18 ENCOUNTER — Encounter: Payer: Self-pay | Admitting: Nurse Practitioner

## 2013-09-18 ENCOUNTER — Ambulatory Visit (INDEPENDENT_AMBULATORY_CARE_PROVIDER_SITE_OTHER): Payer: 59 | Admitting: Nurse Practitioner

## 2013-09-18 VITALS — BP 160/104 | HR 98 | Temp 97.7°F | Ht 64.0 in | Wt 166.6 lb

## 2013-09-18 DIAGNOSIS — F419 Anxiety disorder, unspecified: Secondary | ICD-10-CM

## 2013-09-18 DIAGNOSIS — F410 Panic disorder [episodic paroxysmal anxiety] without agoraphobia: Secondary | ICD-10-CM | POA: Insufficient documentation

## 2013-09-18 DIAGNOSIS — F329 Major depressive disorder, single episode, unspecified: Secondary | ICD-10-CM | POA: Insufficient documentation

## 2013-09-18 DIAGNOSIS — R5381 Other malaise: Secondary | ICD-10-CM

## 2013-09-18 DIAGNOSIS — C760 Malignant neoplasm of head, face and neck: Secondary | ICD-10-CM

## 2013-09-18 DIAGNOSIS — F411 Generalized anxiety disorder: Secondary | ICD-10-CM

## 2013-09-18 DIAGNOSIS — F3289 Other specified depressive episodes: Secondary | ICD-10-CM

## 2013-09-18 DIAGNOSIS — R5383 Other fatigue: Secondary | ICD-10-CM

## 2013-09-18 DIAGNOSIS — F32A Depression, unspecified: Secondary | ICD-10-CM

## 2013-09-18 DIAGNOSIS — G4700 Insomnia, unspecified: Secondary | ICD-10-CM

## 2013-09-18 MED ORDER — CYCLOBENZAPRINE HCL 5 MG PO TABS: 5.0000 mg | ORAL_TABLET | Freq: Three times a day (TID) | ORAL | Status: DC | PRN

## 2013-09-18 MED ORDER — CITALOPRAM HYDROBROMIDE 20 MG PO TABS: 20.0000 mg | ORAL_TABLET | Freq: Every day | ORAL | Status: DC

## 2013-09-18 MED ORDER — ALPRAZOLAM 0.25 MG PO TABS: 0.2500 mg | ORAL_TABLET | Freq: Every evening | ORAL | Status: DC | PRN

## 2013-09-18 NOTE — Patient Instructions (Signed)

## 2013-09-18 NOTE — Progress Notes (Signed)
Subjective:    Patient ID: [name], female    DOB: 11/12/1955, 58 y.o.   MRN: [id number]  HPI Patient in today c/o anxiety- she had laryngeal cancer in the 2013 and since then she has been very emotional- SHe says that she is cancer free right now but she still worries all the time. SHe says that she cannot sleep at night and she is emotionally and physically exhausted. SHe has tried tylenol pM with no results. She is on xanax and has been out of them over a month. SHe says when she has xanax she doesn't take everyday but helps her relax. She is very anxious. SHe is having trouble at work  Because she is scared to work.  Review of Systems  Constitutional: Positive for activity change, appetite change and fatigue.  HENT: Negative.   Respiratory: Negative.   Cardiovascular: Negative.   Gastrointestinal: Negative.   Genitourinary: Negative.   Neurological: Negative.   Hematological: Negative.   Psychiatric/Behavioral: Positive for sleep disturbance. The patient is nervous/anxious.   All other systems reviewed and are negative.      Objective:   Physical Exam  Constitutional: She is oriented to person, place, and time. She appears well-developed and well-nourished.  HENT:  Nose: Nose normal.  Mouth/Throat: Oropharynx is clear and moist.  Eyes: EOM are normal.  Neck: Trachea normal, normal range of motion and full passive range of motion without pain. Neck supple. No JVD present. Carotid bruit is not present. No thyromegaly present.  Cardiovascular: Normal rate, regular rhythm, normal heart sounds and intact distal pulses.  Exam reveals no gallop and no friction rub.   No murmur heard. Pulmonary/Chest: Effort normal and breath sounds normal.  Abdominal: Soft. Bowel sounds are normal. She exhibits no distension and no mass. There is no tenderness.  Musculoskeletal: Normal range of motion.  Lymphadenopathy:    She has no cervical adenopathy.  Neurological: She is alert and  oriented to person, place, and time. She has normal reflexes.  Skin: Skin is warm and dry.  Psychiatric: She has a normal mood and affect. Her behavior is normal. Judgment and thought content normal.    BP 160/104  Pulse 98  Temp(Src) 97.7 F (36.5 C) (Oral)  Ht 5\' 4"  (1.626 m)  Wt 166 lb 9.6 oz (75.569 kg)  BMI 28.58 kg/m2       Assessment & Plan:   1. Depression   2. Panic attacks   3. Insomnia   4. Head and neck cancer   5. Anxiety    No orders of the defined types were placed in this encounter.   Meds ordered this encounter  Medications  . cyclobenzaprine (FLEXERIL) 5 MG tablet    Sig: Take 1 tablet (5 mg total) by mouth 3 (three) times daily as needed for muscle spasms.    Dispense:  30 tablet    Refill:  3    Order Specific Question:  Supervising Provider    Answer:  Chipper Herb [[id number]]  . ALPRAZolam (XANAX) 0.25 MG tablet    Sig: Take 1 tablet (0.25 mg total) by mouth at bedtime as needed for sleep or anxiety.    Dispense:  30 tablet    Refill:  0    Order Specific Question:  Supervising Provider    Answer:  Chipper Herb [[id number]]  . citalopram (CELEXA) 20 MG tablet    Sig: Take 1 tablet (20 mg total) by mouth daily.    Dispense:  30 tablet    Refill:  3    Order Specific Question:  Supervising Provider    Answer:  Chipper Herb [[id number]]    Labs pending Health maintenance reviewed Diet and exercise encouraged Continue all meds Follow up  In 3 months    [place], FNP

## 2013-09-19 LAB — THYROID PANEL WITH TSH
Free Thyroxine Index: 1.6 (ref 1.2–4.9)
T3 UPTAKE RATIO: 31 % (ref 24–39)
T4, Total: 5 ug/dL (ref 4.5–12.0)
TSH: 3.74 u[IU]/mL (ref 0.450–4.500)

## 2013-09-19 LAB — BRAIN NATRIURETIC PEPTIDE: BNP: 4 pg/mL (ref 0.0–100.0)

## 2013-09-21 LAB — BMP8+EGFR
BUN / CREAT RATIO: 17 (ref 9–23)
BUN: 18 mg/dL (ref 6–24)
CALCIUM: 9.1 mg/dL (ref 8.7–10.2)
CO2: 21 mmol/L (ref 18–29)
CREATININE: 1.08 mg/dL — AB (ref 0.57–1.00)
Chloride: 101 mmol/L (ref 97–108)
GFR calc Af Amer: 65 mL/min/{1.73_m2} (ref >59)
GFR calc non Af Amer: 57 mL/min/{1.73_m2} — ABNORMAL LOW (ref >59)
Glucose: 107 mg/dL — ABNORMAL HIGH (ref 65–99)
Potassium: 4.2 mmol/L (ref 3.5–5.2)
Sodium: 140 mmol/L (ref 134–144)

## 2013-09-21 LAB — SPECIMEN STATUS REPORT

## 2013-09-22 ENCOUNTER — Telehealth: Payer: Self-pay | Admitting: Nurse Practitioner

## 2013-09-22 MED ORDER — TRAZODONE HCL 50 MG PO TABS: 25.0000 mg | ORAL_TABLET | Freq: Every evening | ORAL | Status: DC | PRN

## 2013-09-22 NOTE — Telephone Encounter (Signed)
Patient said she is not taking the xanax to sleep... You gave her the flexeril for her to sleep

## 2013-09-22 NOTE — Telephone Encounter (Signed)
According to chart has been usin xanax for sleep- is she still doing this?

## 2013-09-22 NOTE — Telephone Encounter (Signed)
Patient aware.

## 2013-09-22 NOTE — Telephone Encounter (Signed)
trazadone rx sent to pharmacy °

## 2013-09-25 ENCOUNTER — Telehealth: Payer: Self-pay | Admitting: Family Medicine

## 2013-09-25 NOTE — Telephone Encounter (Signed)
Message copied by Waverly Ferrari on Mon Sep 25, 2013  3:52 PM ------      Message from: Chevis Pretty      Created: Fri Sep 22, 2013  8:49 AM       Kidney and liver function stable       ------

## 2014-02-19 ENCOUNTER — Ambulatory Visit (INDEPENDENT_AMBULATORY_CARE_PROVIDER_SITE_OTHER): Payer: 59 | Admitting: Family

## 2014-02-19 ENCOUNTER — Encounter: Payer: Self-pay | Admitting: Family

## 2014-02-19 ENCOUNTER — Telehealth: Payer: Self-pay | Admitting: Family

## 2014-02-19 VITALS — BP 179/95 | HR 106 | Temp 98.0°F | Ht 64.0 in | Wt 174.6 lb

## 2014-02-19 DIAGNOSIS — R21 Rash and other nonspecific skin eruption: Secondary | ICD-10-CM

## 2014-02-19 DIAGNOSIS — B369 Superficial mycosis, unspecified: Secondary | ICD-10-CM

## 2014-02-19 DIAGNOSIS — B370 Candidal stomatitis: Secondary | ICD-10-CM

## 2014-02-19 DIAGNOSIS — J069 Acute upper respiratory infection, unspecified: Secondary | ICD-10-CM

## 2014-02-19 MED ORDER — MAGIC MOUTHWASH: 5.0000 mL | Freq: Three times a day (TID) | ORAL | Status: DC | PRN

## 2014-02-19 MED ORDER — NYSTATIN 100000 UNIT/GM EX POWD: CUTANEOUS | Status: DC

## 2014-02-19 MED ORDER — DOXYCYCLINE HYCLATE 100 MG PO TABS: 100.0000 mg | ORAL_TABLET | Freq: Two times a day (BID) | ORAL | Status: DC

## 2014-02-19 MED ORDER — MUPIROCIN 2 % EX OINT
1.0000 | TOPICAL_OINTMENT | Freq: Two times a day (BID) | CUTANEOUS | Status: DC
Start: 2014-02-19 — End: 2014-12-12

## 2014-02-19 NOTE — Progress Notes (Signed)
Subjective:    Patient ID: [name], female    DOB: 06/28/1955, 58 y.o.   MRN: [id number]  Rash This is a new problem. The current episode started 1 to 4 weeks ago ("three weeks ago"). The problem has been waxing and waning since onset. The affected locations include the abdomen, groin and genitalia. The rash is characterized by redness and itchiness. Associated symptoms include congestion, coughing and a sore throat. Pertinent negatives include no diarrhea, eye pain, facial edema, fever, joint pain or shortness of breath. Past treatments include anti-itch cream and antihistamine (Antifungal). The treatment provided mild relief.      Review of Systems  Constitutional: Negative.  Negative for fever.  HENT: Positive for congestion and sore throat.   Eyes: Negative.  Negative for pain.  Respiratory: Positive for cough. Negative for shortness of breath.   Cardiovascular: Negative.  Negative for palpitations.  Gastrointestinal: Negative.  Negative for diarrhea.  Endocrine: Negative.   Genitourinary: Negative.   Musculoskeletal: Negative.  Negative for joint pain.  Skin: Positive for rash.  Neurological: Negative.  Negative for headaches.  Hematological: Negative.   Psychiatric/Behavioral: Negative.   All other systems reviewed and are negative.      Objective:   Physical Exam  Vitals reviewed. Constitutional: She is oriented to person, place, and time. She appears well-developed and well-nourished. No distress.  HENT:  Head: Normocephalic and atraumatic.  Right Ear: External ear normal.  Left Ear: External ear normal.  Mouth/Throat: Oropharyngeal exudate present.  Nasal passage erythema with mild swelling  Eyes: Pupils are equal, round, and reactive to light.  Neck: Normal range of motion. Neck supple. No thyromegaly present.  Cardiovascular: Normal rate, regular rhythm, normal heart sounds and intact distal pulses.   No murmur heard. Pulmonary/Chest: Effort normal and  breath sounds normal. No respiratory distress. She has no wheezes.  Abdominal: Soft. Bowel sounds are normal. She exhibits no distension. There is no tenderness.  Musculoskeletal: Normal range of motion. She exhibits no edema and no tenderness.  Neurological: She is alert and oriented to person, place, and time. She has normal reflexes. No cranial nerve deficit.  Skin: Skin is warm and dry. Rash (Generalized pustular rash in groin and going up abd) noted. Rash is pustular. There is erythema.  Psychiatric: She has a normal mood and affect. Her behavior is normal. Judgment and thought content normal.    BP 179/95  Pulse 106  Temp(Src) 98 F (36.7 C) (Oral)  Ht 5\' 4"  (1.626 m)  Wt 174 lb 9.6 oz (79.198 kg)  BMI 29.96 kg/m2       Assessment & Plan:  1. Generalized rash -Do not pick or scratch  -Keep clean and dry - doxycycline (VIBRA-TABS) 100 MG tablet; Take 1 tablet (100 mg total) by mouth 2 (two) times daily.  Dispense: 20 tablet; Refill: 0 - mupirocin ointment (BACTROBAN) 2 %; Place 1 application into the nose 2 (two) times daily.  Dispense: 22 g; Refill: 0  2. URI (upper respiratory infection) -- Take meds as prescribed - Use a cool mist humidifier  -Use saline nose sprays frequently -Saline irrigations of the nose can be very helpful if done frequently.  * 4X daily for 1 week*  * Use of a nettie pot can be helpful with this. Follow directions with this* -Force fluids -For any cough or congestion  Use plain Mucinex- regular strength or max strength is fine   * Children- consult with Pharmacist for dosing -For fever or aces  or pains- take tylenol or ibuprofen appropriate for age and weight.  * for fevers greater than 101 orally you may alternate ibuprofen and tylenol every  3 hours. -Throat lozenges if help - doxycycline (VIBRA-TABS) 100 MG tablet; Take 1 tablet (100 mg total) by mouth 2 (two) times daily.  Dispense: 20 tablet; Refill: 0  3. Oral thrush - Alum & Mag  Hydroxide-Simeth (MAGIC MOUTHWASH) SOLN; Take 5 mLs by mouth 3 (three) times daily as needed for mouth pain.  Dispense: 100 mL; Refill: 1  4. Fungal infection of skin -Keep area clean and dry - nystatin (MYCOSTATIN/NYSTOP) 100000 UNIT/GM POWD; Apply under breast BID  Dispense: 60 g; Refill: [place], FNP

## 2014-02-19 NOTE — Telephone Encounter (Signed)
Unable to speak with patient but left a detailed message with directions on how to use each medication that she was given today.  Asked her to call back if she still has questions.

## 2014-02-19 NOTE — Patient Instructions (Signed)
Rash A rash is a change in the color or texture of your skin. There are many different types of rashes. You may have other problems that accompany your rash. CAUSES   Infections.  Allergic reactions. This can include allergies to pets or foods.  Certain medicines.  Exposure to certain chemicals, soaps, or cosmetics.  Heat.  Exposure to poisonous plants.  Tumors, both cancerous and noncancerous. SYMPTOMS   Redness.  Scaly skin.  Itchy skin.  Dry or cracked skin.  Bumps.  Blisters.  Pain. DIAGNOSIS  Your caregiver may do a physical exam to determine what type of rash you have. A skin sample (biopsy) may be taken and examined under a microscope. TREATMENT  Treatment depends on the type of rash you have. Your caregiver may prescribe certain medicines. For serious conditions, you may need to see a skin doctor (dermatologist). HOME CARE INSTRUCTIONS   Avoid the substance that caused your rash.  Do not scratch your rash. This can cause infection.  You may take cool baths to help stop itching.  Only take over-the-counter or prescription medicines as directed by your caregiver.  Keep all follow-up appointments as directed by your caregiver. SEEK IMMEDIATE MEDICAL CARE IF:  You have increasing pain, swelling, or redness.  You have a fever.  You have new or severe symptoms.  You have body aches, diarrhea, or vomiting.  Your rash is not better after 3 days. MAKE SURE YOU:  Understand these instructions.  Will watch your condition.  Will get help right away if you are not doing well or get worse. Document Released: 05/08/2002 Document Revised: 08/10/2011 Document Reviewed: 03/02/2011 [facility] Patient Information 2015 [place], [place]. This information is not intended to replace advice given to you by your health care provider. Make sure you discuss any questions you have with your health care provider. Upper Respiratory Infection, Adult An upper respiratory  infection (URI) is also known as the common cold. It is often caused by a type of germ (virus). Colds are easily spread (contagious). You can pass it to others by kissing, coughing, sneezing, or drinking out of the same glass. Usually, you get better in 1 or 2 weeks.  HOME CARE   Only take medicine as told by your doctor.  Use a warm mist humidifier or breathe in steam from a hot shower.  Drink enough water and fluids to keep your pee (urine) clear or pale yellow.  Get plenty of rest.  Return to work when your temperature is back to normal or as told by your doctor. You may use a face mask and wash your hands to stop your cold from spreading. GET HELP RIGHT AWAY IF:   After the first few days, you feel you are getting worse.  You have questions about your medicine.  You have chills, shortness of breath, or brown or red spit (mucus).  You have yellow or brown snot (nasal discharge) or pain in the face, especially when you bend forward.  You have a fever, puffy (swollen) neck, pain when you swallow, or white spots in the back of your throat.  You have a bad headache, ear pain, sinus pain, or chest pain.  You have a high-pitched whistling sound when you breathe in and out (wheezing).  You have a lasting cough or cough up blood.  You have sore muscles or a stiff neck. MAKE SURE YOU:   Understand these instructions.  Will watch your condition.  Will get help right away if you are not  doing well or get worse. Document Released: 11/04/2007 Document Revised: 08/10/2011 Document Reviewed: 08/23/2013 [facility] Patient Information 2015 [place], [place]. This information is not intended to replace advice given to you by your health care provider. Make sure you discuss any questions you have with your health care provider.

## 2014-03-24 ENCOUNTER — Ambulatory Visit (INDEPENDENT_AMBULATORY_CARE_PROVIDER_SITE_OTHER): Payer: 59

## 2014-03-24 DIAGNOSIS — Z23 Encounter for immunization: Secondary | ICD-10-CM

## 2014-05-28 ENCOUNTER — Ambulatory Visit (INDEPENDENT_AMBULATORY_CARE_PROVIDER_SITE_OTHER): Payer: 59 | Admitting: Nurse Practitioner

## 2014-05-28 ENCOUNTER — Encounter: Payer: Self-pay | Admitting: Nurse Practitioner

## 2014-05-28 VITALS — BP 187/110 | HR 116 | Temp 97.7°F | Ht 64.0 in | Wt 178.0 lb

## 2014-05-28 DIAGNOSIS — C760 Malignant neoplasm of head, face and neck: Secondary | ICD-10-CM

## 2014-05-28 DIAGNOSIS — I10 Essential (primary) hypertension: Secondary | ICD-10-CM

## 2014-05-28 DIAGNOSIS — F410 Panic disorder [episodic paroxysmal anxiety] without agoraphobia: Secondary | ICD-10-CM

## 2014-05-28 DIAGNOSIS — R609 Edema, unspecified: Secondary | ICD-10-CM

## 2014-05-28 DIAGNOSIS — R140 Abdominal distension (gaseous): Secondary | ICD-10-CM

## 2014-05-28 DIAGNOSIS — F419 Anxiety disorder, unspecified: Secondary | ICD-10-CM

## 2014-05-28 MED ORDER — LISINOPRIL 40 MG PO TABS: 40.0000 mg | ORAL_TABLET | Freq: Every day | ORAL | Status: DC

## 2014-05-28 MED ORDER — ALPRAZOLAM 0.25 MG PO TABS: 0.2500 mg | ORAL_TABLET | Freq: Every evening | ORAL | Status: DC | PRN

## 2014-05-28 NOTE — Progress Notes (Signed)
   Subjective:    Patient ID: [name], female    DOB: March 24, 1956, 58 y.o.   MRN: [id number]  HPI Patient in c/o retaining fluids- Only happened on Cristmas EVE- Has never had this issue before. She elevated her legs and it improved. Says that her belly feels full and is enlarging and wonders if it has fluid in it. Weight up 12lbs since September. * Scheduled for Ct scan January 19,2015      Review of Systems  Constitutional: Negative.   HENT: Negative.   Respiratory: Negative.   Cardiovascular: Positive for leg swelling.  Gastrointestinal: Positive for abdominal distention.  Genitourinary: Negative.   Neurological: Negative.   Psychiatric/Behavioral: Negative.   All other systems reviewed and are negative.      Objective:   Physical Exam  Constitutional: She is oriented to person, place, and time. She appears well-developed and well-nourished. No distress.  Cardiovascular: Normal rate, regular rhythm and normal heart sounds.   Pulmonary/Chest: Effort normal and breath sounds normal.  Abdominal: Soft. Bowel sounds are normal. She exhibits no distension and no mass. There is no tenderness. There is no rebound and no guarding.  Musculoskeletal: She exhibits no edema.  Neurological: She is alert and oriented to person, place, and time.  Skin: Skin is warm and dry.  Psychiatric: She has a normal mood and affect. Her behavior is normal. Judgment and thought content normal.   BP 187/110 mmHg  Pulse 116  Temp(Src) 97.7 F (36.5 C) (Oral)  Ht $R'5\' 4"'DW$  (1.626 m)  Wt 178 lb (80.74 kg)  BMI 30.54 kg/m2        Assessment & Plan:  1. Peripheral edema Elevate legs when sitting  2. Abdominal distention Keep appointment for Abd CT scan in January  3. Essential hypertension Do not add salt to diet - lisinopril (PRINIVIL,ZESTRIL) 40 MG tablet; Take 1 tablet (40 mg total) by mouth daily.  Dispense: 90 tablet; Refill: 3 - CMP14+EGFR - NMR, lipoprofile Follow up in 2  weeks  [name] Done, FNP

## 2014-05-28 NOTE — Patient Instructions (Signed)

## 2014-05-29 LAB — CMP14+EGFR
A/G RATIO: 2.2 (ref 1.1–2.5)
ALBUMIN: 5 g/dL (ref 3.5–5.5)
ALT: 14 IU/L (ref 0–32)
AST: 16 IU/L (ref 0–40)
Alkaline Phosphatase: 77 IU/L (ref 39–117)
BUN/Creatinine Ratio: 15 (ref 9–23)
BUN: 16 mg/dL (ref 6–24)
CO2: 27 mmol/L (ref 18–29)
CREATININE: 1.06 mg/dL — AB (ref 0.57–1.00)
Calcium: 10.7 mg/dL — ABNORMAL HIGH (ref 8.7–10.2)
Chloride: 100 mmol/L (ref 97–108)
GFR calc Af Amer: 67 mL/min/{1.73_m2} (ref >59)
GFR, EST NON AFRICAN AMERICAN: 58 mL/min/{1.73_m2} — AB (ref >59)
GLOBULIN, TOTAL: 2.3 g/dL (ref 1.5–4.5)
Glucose: 102 mg/dL — ABNORMAL HIGH (ref 65–99)
Potassium: 4.5 mmol/L (ref 3.5–5.2)
SODIUM: 141 mmol/L (ref 134–144)
Total Bilirubin: 0.3 mg/dL (ref 0.0–1.2)
Total Protein: 7.3 g/dL (ref 6.0–8.5)

## 2014-05-29 LAB — NMR, LIPOPROFILE
CHOLESTEROL: 280 mg/dL — AB (ref 100–199)
HDL CHOLESTEROL BY NMR: 42 mg/dL (ref >39)
HDL Particle Number: 32.5 umol/L (ref >=30.5)
LDL PARTICLE NUMBER: 2344 nmol/L — AB (ref <1000)
LDL Size: 20.2 nm (ref >20.5)
LDL-C: 169 mg/dL — AB (ref 0–99)
LP-IR Score: 77 — ABNORMAL HIGH (ref <=45)
Small LDL Particle Number: 1221 nmol/L — ABNORMAL HIGH (ref <=527)
Triglycerides by NMR: 344 mg/dL — ABNORMAL HIGH (ref 0–149)

## 2014-06-12 ENCOUNTER — Ambulatory Visit: Payer: 59 | Admitting: Nurse Practitioner

## 2014-06-18 ENCOUNTER — Ambulatory Visit (INDEPENDENT_AMBULATORY_CARE_PROVIDER_SITE_OTHER): Payer: 59 | Admitting: Nurse Practitioner

## 2014-06-18 ENCOUNTER — Encounter: Payer: Self-pay | Admitting: Nurse Practitioner

## 2014-06-18 VITALS — BP 139/82 | HR 97 | Temp 97.1°F | Ht 64.0 in | Wt 178.0 lb

## 2014-06-18 DIAGNOSIS — G4700 Insomnia, unspecified: Secondary | ICD-10-CM

## 2014-06-18 DIAGNOSIS — I10 Essential (primary) hypertension: Secondary | ICD-10-CM

## 2014-06-18 MED ORDER — HYDROXYZINE PAMOATE 50 MG PO CAPS: 50.0000 mg | ORAL_CAPSULE | Freq: Every day | ORAL | Status: DC

## 2014-06-18 MED ORDER — LISINOPRIL 40 MG PO TABS: 40.0000 mg | ORAL_TABLET | Freq: Every day | ORAL | Status: DC

## 2014-06-18 NOTE — Patient Instructions (Signed)

## 2014-06-18 NOTE — Progress Notes (Addendum)
   Subjective:    Patient ID: [name], female    DOB: 1955-12-20, 59 y.o.   MRN: [id number]   Patient here today for follow up of chronic medical problems.  Hypertension This is a chronic problem. The current episode started more than 1 year ago. The problem is unchanged. The problem is controlled. Risk factors for coronary artery disease include obesity. Past treatments include ACE inhibitors (Patient was started on ace 2 weeks ago. ). Compliance problems include diet and exercise.   Gad Xanax as needed. Some days she only takes 1 tablet instead of two.   Review of Systems  Constitutional: Negative.   HENT: Negative.   Respiratory: Negative.   Cardiovascular: Negative.   Gastrointestinal: Negative.   Genitourinary: Negative.   Neurological: Negative.   Psychiatric/Behavioral: Negative.   All other systems reviewed and are negative.      Objective:   Physical Exam  Constitutional: She is oriented to person, place, and time. She appears well-developed and well-nourished.  HENT:  Nose: Nose normal.  Mouth/Throat: Oropharynx is clear and moist.  Eyes: EOM are normal.  Neck: Trachea normal, normal range of motion and full passive range of motion without pain. Neck supple. No JVD present. Carotid bruit is not present. No thyromegaly present.  Cardiovascular: Normal rate, regular rhythm, normal heart sounds and intact distal pulses.  Exam reveals no gallop and no friction rub.   No murmur heard. Pulmonary/Chest: Effort normal and breath sounds normal.  Abdominal: Soft. Bowel sounds are normal. She exhibits no distension and no mass. There is no tenderness.  Musculoskeletal: Normal range of motion.  Lymphadenopathy:    She has no cervical adenopathy.  Neurological: She is alert and oriented to person, place, and time. She has normal reflexes.  Skin: Skin is warm and dry.  Psychiatric: She has a normal mood and affect. Her behavior is normal. Judgment and thought content  normal.   BP 139/82 mmHg  Pulse 97  Temp(Src) 97.1 F (36.2 C) (Oral)  Ht 5\' 4"  (1.626 m)  Wt 178 lb (80.74 kg)  BMI 30.54 kg/m2        Assessment & Plan:  1. Essential hypertension Do not add salt to diet - lisinopril (PRINIVIL,ZESTRIL) 40 MG tablet; Take 1 tablet (40 mg total) by mouth daily.  Dispense: 90 tablet; Refill: 3  2. Insomnia **bedtime ritual Do not take xanax with vistaril - hydrOXYzine (VISTARIL) 50 MG capsule; Take 1 capsule (50 mg total) by mouth at bedtime.  Dispense: 90 capsule; Refill: 1    Labs discussed at appointment Health maintenance reviewed Diet and exercise encouraged Continue all meds Follow up  In 6 months   [place], FNP

## 2014-07-03 ENCOUNTER — Ambulatory Visit (INDEPENDENT_AMBULATORY_CARE_PROVIDER_SITE_OTHER): Payer: 59 | Admitting: Family Medicine

## 2014-07-03 ENCOUNTER — Encounter: Payer: Self-pay | Admitting: Family Medicine

## 2014-07-03 VITALS — BP 153/95 | HR 103 | Temp 96.9°F | Ht 64.0 in | Wt 180.0 lb

## 2014-07-03 DIAGNOSIS — L0292 Furuncle, unspecified: Secondary | ICD-10-CM

## 2014-07-03 MED ORDER — MUPIROCIN CALCIUM 2 % EX CREA: 1.0000 "application " | TOPICAL_CREAM | Freq: Two times a day (BID) | CUTANEOUS | Status: DC

## 2014-07-03 MED ORDER — DOXYCYCLINE HYCLATE 100 MG PO TABS: 100.0000 mg | ORAL_TABLET | Freq: Two times a day (BID) | ORAL | Status: DC

## 2014-07-03 NOTE — Progress Notes (Signed)
   Subjective:    Patient ID: [name], female    DOB: 12/31/1955, 59 y.o.   MRN: [id number]  HPI C/o right breast cyst that opened up and popped and drained today  Review of Systems  Constitutional: Negative for fever.  HENT: Negative for ear pain.   Eyes: Negative for discharge.  Respiratory: Negative for cough.   Cardiovascular: Negative for chest pain.  Gastrointestinal: Negative for abdominal distention.  Endocrine: Negative for polyuria.  Genitourinary: Negative for difficulty urinating.  Musculoskeletal: Negative for gait problem and neck pain.  Skin: Negative for color change and rash.  Neurological: Negative for speech difficulty and headaches.  Psychiatric/Behavioral: Negative for agitation.       Objective:    BP 153/95 mmHg  Pulse 103  Temp(Src) 96.9 F (36.1 C) (Oral)  Ht 5\' 4"  (1.626 m)  Wt 180 lb (81.647 kg)  BMI 30.88 kg/m2 Physical Exam  Constitutional: She is oriented to person, place, and time. She appears well-developed and well-nourished.  HENT:  Head: Normocephalic and atraumatic.  Mouth/Throat: Oropharynx is clear and moist.  Eyes: Pupils are equal, round, and reactive to light.  Neck: Normal range of motion. Neck supple.  Cardiovascular: Normal rate and regular rhythm.   No murmur heard. Pulmonary/Chest: Effort normal and breath sounds normal.  Abdominal: Soft. Bowel sounds are normal. There is no tenderness.  Neurological: She is alert and oriented to person, place, and time.  Skin: Skin is warm and dry.  Psychiatric: She has a normal mood and affect.    Right breast with furuncle w/o fluctuance      Assessment & Plan:     ICD-9-CM ICD-10-CM   1. Furunculosis 680.9 L02.92 doxycycline (VIBRA-TABS) 100 MG tablet     mupirocin cream (BACTROBAN) 2 %     Return if symptoms worsen or fail to improve.  Lysbeth Penner FNP

## 2014-07-10 ENCOUNTER — Encounter: Payer: Self-pay | Admitting: Nurse Practitioner

## 2014-08-21 ENCOUNTER — Other Ambulatory Visit: Payer: Self-pay | Admitting: Nurse Practitioner

## 2014-08-21 ENCOUNTER — Encounter: Payer: Self-pay | Admitting: Nurse Practitioner

## 2014-08-21 MED ORDER — ZOLPIDEM TARTRATE 5 MG PO TABS: 5.0000 mg | ORAL_TABLET | Freq: Every evening | ORAL | Status: DC | PRN

## 2014-12-12 ENCOUNTER — Ambulatory Visit (INDEPENDENT_AMBULATORY_CARE_PROVIDER_SITE_OTHER): Payer: 59 | Admitting: Nurse Practitioner

## 2014-12-12 ENCOUNTER — Encounter: Payer: Self-pay | Admitting: Nurse Practitioner

## 2014-12-12 ENCOUNTER — Encounter: Payer: Self-pay | Admitting: *Deleted

## 2014-12-12 VITALS — BP 137/89 | HR 114 | Temp 97.4°F | Ht 64.0 in | Wt 179.6 lb

## 2014-12-12 DIAGNOSIS — I10 Essential (primary) hypertension: Secondary | ICD-10-CM | POA: Diagnosis not present

## 2014-12-12 DIAGNOSIS — F419 Anxiety disorder, unspecified: Secondary | ICD-10-CM | POA: Diagnosis not present

## 2014-12-12 DIAGNOSIS — G4700 Insomnia, unspecified: Secondary | ICD-10-CM

## 2014-12-12 DIAGNOSIS — F329 Major depressive disorder, single episode, unspecified: Secondary | ICD-10-CM | POA: Diagnosis not present

## 2014-12-12 DIAGNOSIS — J0100 Acute maxillary sinusitis, unspecified: Secondary | ICD-10-CM | POA: Diagnosis not present

## 2014-12-12 DIAGNOSIS — F410 Panic disorder [episodic paroxysmal anxiety] without agoraphobia: Secondary | ICD-10-CM | POA: Diagnosis not present

## 2014-12-12 DIAGNOSIS — F32A Depression, unspecified: Secondary | ICD-10-CM

## 2014-12-12 MED ORDER — AMOXICILLIN 875 MG PO TABS: 875.0000 mg | ORAL_TABLET | Freq: Two times a day (BID) | ORAL | Status: DC

## 2014-12-12 MED ORDER — AMOXICILLIN 875 MG PO TABS
875.0000 mg | ORAL_TABLET | Freq: Two times a day (BID) | ORAL | Status: DC
Start: 2014-12-12 — End: 2014-12-12

## 2014-12-12 MED ORDER — ALPRAZOLAM 0.5 MG PO TABS: 0.5000 mg | ORAL_TABLET | Freq: Two times a day (BID) | ORAL | Status: DC | PRN

## 2014-12-12 NOTE — Progress Notes (Addendum)
Subjective:    Patient ID: [name], female    DOB: 1956-02-14, 59 y.o.   MRN: [id number]   Patient here today for follow up of chronic medical problems.  Hypertension This is a chronic problem. The current episode started more than 1 year ago. The problem is unchanged. The problem is controlled. Risk factors for coronary artery disease include obesity. Past treatments include ACE inhibitors (Patient was started on ace 2 weeks ago. ). Compliance problems include diet and exercise.   Gad/panic attacks Xanax as needed. Has not needed as much lately- but recently got custody of 4 grandkids under the age of 64 and she is having a hard time. insomnia Patient takes ambien nightly- helps her sleep good at night- feels rested  *C/O sinus congestion and cough- stated about 1 week ago- no fever    Review of Systems  Constitutional: Positive for appetite change and fatigue. Negative for fever and chills.  HENT: Positive for congestion, rhinorrhea and sinus pressure.   Respiratory: Positive for cough (productive at times).   Cardiovascular: Negative.   Gastrointestinal: Negative.   Genitourinary: Negative.   Neurological: Negative.   Psychiatric/Behavioral: Negative.   All other systems reviewed and are negative.      Objective:   Physical Exam  Constitutional: She is oriented to person, place, and time. She appears well-developed and well-nourished.  HENT:  Nose: Nose normal.  Mouth/Throat: Oropharynx is clear and moist.  Eyes: EOM are normal.  Neck: Trachea normal, normal range of motion and full passive range of motion without pain. Neck supple. No JVD present. Carotid bruit is not present. No thyromegaly present.  Cardiovascular: Normal rate, regular rhythm, normal heart sounds and intact distal pulses.  Exam reveals no gallop and no friction rub.   No murmur heard. Pulmonary/Chest: Effort normal and breath sounds normal.  Abdominal: Soft. Bowel sounds are normal. She exhibits  no distension and no mass. There is no tenderness.  Very large abdominal wall hernia  Musculoskeletal: Normal range of motion.  Lymphadenopathy:    She has no cervical adenopathy.  Neurological: She is alert and oriented to person, place, and time. She has normal reflexes.  Skin: Skin is warm and dry.  Psychiatric: She has a normal mood and affect. Her behavior is normal. Judgment and thought content normal.   BP 137/89 mmHg  Pulse 114  Temp(Src) 97.4 F (36.3 C) (Oral)  Ht _0  (1.626 m)  Wt 179 lb 9.6 oz (81.466 kg)  BMI 30.81 kg/m2        Assessment & Plan:  1. Essential hypertension  do not dd saltt o diet - CMP14+EGFR - Lipid panel  2. Panic attacks Stress management  3. Insomnia Bedtime ritual Can try taking 2 ambien at bedtime- if works well will increase dose to 35m nightly  4. Depression  5. Anxiety Increased xanax from 0.25 t 0.542mno more then BID - ALPRAZolam (XANAX) 0.5 MG tablet; Take 1 tablet (0.5 mg total) by mouth 2 (two) times daily as needed for anxiety.  Dispense: 60 tablet; Refill: 1  6. Acute maxillary sinusitis, recurrence not specified 1. Take meds as prescribed 2. Use a cool mist humidifier especially during the winter months and when heat has been humid. 3. Use saline nose sprays frequently 4. Saline irrigations of the nose can be very helpful if done frequently.  * 4X daily for 1 week*  * Use of a nettie pot can be helpful with this. Follow directions with this*  5. Drink plenty of fluids 6. Keep thermostat turn down low 7.For any cough or congestion  Use plain Mucinex- regular strength or max strength is fine   * Children- consult with Pharmacist for dosing 8. For fever or aces or pains- take tylenol or ibuprofen appropriate for age and weight.  * for fevers greater than 101 orally you may alternate ibuprofen and tylenol every  3 hours.   - amoxicillin (AMOXIL) 875 MG tablet; Take 1 tablet (875 mg total) by mouth 2 (two) times daily.   Dispense: 20 tablet; Refill: 0    Labs pending Health maintenance reviewed Diet and exercise encouraged Continue all meds Follow up  In 3 month   [place], FNP

## 2014-12-12 NOTE — Patient Instructions (Signed)
Insomnia Insomnia is frequent trouble falling and/or staying asleep. Insomnia can be a long term problem or a short term problem. Both are common. Insomnia can be a short term problem when the wakefulness is related to a certain stress or worry. Long term insomnia is often related to ongoing stress during waking hours and/or poor sleeping habits. Overtime, sleep deprivation itself can make the problem worse. Every little thing feels more severe because you are overtired and your ability to cope is decreased. CAUSES   Stress, anxiety, and depression.  Poor sleeping habits.  Distractions such as TV in the bedroom.  Naps close to bedtime.  Engaging in emotionally charged conversations before bed.  Technical reading before sleep.  Alcohol and other sedatives. They may make the problem worse. They can hurt normal sleep patterns and normal dream activity.  Stimulants such as caffeine for several hours prior to bedtime.  Pain syndromes and shortness of breath can cause insomnia.  Exercise late at night.  Changing time zones may cause sleeping problems (jet lag). It is sometimes helpful to have someone observe your sleeping patterns. They should look for periods of not breathing during the night (sleep apnea). They should also look to see how long those periods last. If you live alone or observers are uncertain, you can also be observed at a sleep clinic where your sleep patterns will be professionally monitored. Sleep apnea requires a checkup and treatment. Give your caregivers your medical history. Give your caregivers observations your family has made about your sleep.  SYMPTOMS   Not feeling rested in the morning.  Anxiety and restlessness at bedtime.  Difficulty falling and staying asleep. TREATMENT   Your caregiver may prescribe treatment for an underlying medical disorders. Your caregiver can give advice or help if you are using alcohol or other drugs for self-medication. Treatment  of underlying problems will usually eliminate insomnia problems.  Medications can be prescribed for short time use. They are generally not recommended for lengthy use.  Over-the-counter sleep medicines are not recommended for lengthy use. They can be habit forming.  You can promote easier sleeping by making lifestyle changes such as:  Using relaxation techniques that help with breathing and reduce muscle tension.  Exercising earlier in the day.  Changing your diet and the time of your last meal. No night time snacks.  Establish a regular time to go to bed.  Counseling can help with stressful problems and worry.  Soothing music and white noise may be helpful if there are background noises you cannot remove.  Stop tedious detailed work at least one hour before bedtime. HOME CARE INSTRUCTIONS   Keep a diary. Inform your caregiver about your progress. This includes any medication side effects. See your caregiver regularly. Take note of:  Times when you are asleep.  Times when you are awake during the night.  The quality of your sleep.  How you feel the next day. This information will help your caregiver care for you.  Get out of bed if you are still awake after 15 minutes. Read or do some quiet activity. Keep the lights down. Wait until you feel sleepy and go back to bed.  Keep regular sleeping and waking hours. Avoid naps.  Exercise regularly.  Avoid distractions at bedtime. Distractions include watching television or engaging in any intense or detailed activity like attempting to balance the household checkbook.  Develop a bedtime ritual. Keep a familiar routine of bathing, brushing your teeth, climbing into bed at the same   time each night, listening to soothing music. Routines increase the success of falling to sleep faster.  Use relaxation techniques. This can be using breathing and muscle tension release routines. It can also include visualizing peaceful scenes. You can  also help control troubling or intruding thoughts by keeping your mind occupied with boring or repetitive thoughts like the old concept of counting sheep. You can make it more creative like imagining planting one beautiful flower after another in your backyard garden.  During your day, work to eliminate stress. When this is not possible use some of the previous suggestions to help reduce the anxiety that accompanies stressful situations. MAKE SURE YOU:   Understand these instructions.  Will watch your condition.  Will get help right away if you are not doing well or get worse. Document Released: 05/15/2000 Document Revised: 08/10/2011 Document Reviewed: 06/15/2007 [employer] Patient Information 2015 [employer]. This information is not intended to replace advice given to you by your health care provider. Make sure you discuss any questions you have with your health care provider.  

## 2014-12-12 NOTE — Addendum Note (Signed)
Addended by: Chevis Pretty on: 12/12/2014 02:38 PM   Modules accepted: Orders

## 2014-12-13 ENCOUNTER — Other Ambulatory Visit: Payer: Self-pay | Admitting: Nurse Practitioner

## 2014-12-13 ENCOUNTER — Telehealth: Payer: Self-pay | Admitting: *Deleted

## 2014-12-13 DIAGNOSIS — E785 Hyperlipidemia, unspecified: Secondary | ICD-10-CM | POA: Insufficient documentation

## 2014-12-13 LAB — CMP14+EGFR
ALBUMIN: 4.8 g/dL (ref 3.5–5.5)
ALT: 19 IU/L (ref 0–32)
AST: 13 IU/L (ref 0–40)
Albumin/Globulin Ratio: 1.9 (ref 1.1–2.5)
Alkaline Phosphatase: 76 IU/L (ref 39–117)
BUN / CREAT RATIO: 11 (ref 9–23)
BUN: 12 mg/dL (ref 6–24)
Bilirubin Total: 0.4 mg/dL (ref 0.0–1.2)
CHLORIDE: 104 mmol/L (ref 97–108)
CO2: 23 mmol/L (ref 18–29)
Calcium: 9.7 mg/dL (ref 8.7–10.2)
Creatinine, Ser: 1.11 mg/dL — ABNORMAL HIGH (ref 0.57–1.00)
GFR, EST AFRICAN AMERICAN: 63 mL/min/{1.73_m2} (ref >59)
GFR, EST NON AFRICAN AMERICAN: 54 mL/min/{1.73_m2} — AB (ref >59)
GLOBULIN, TOTAL: 2.5 g/dL (ref 1.5–4.5)
Glucose: 111 mg/dL — ABNORMAL HIGH (ref 65–99)
POTASSIUM: 4.3 mmol/L (ref 3.5–5.2)
SODIUM: 144 mmol/L (ref 134–144)
TOTAL PROTEIN: 7.3 g/dL (ref 6.0–8.5)

## 2014-12-13 LAB — LIPID PANEL
CHOL/HDL RATIO: 6.6 ratio — AB (ref 0.0–4.4)
CHOLESTEROL TOTAL: 256 mg/dL — AB (ref 100–199)
HDL: 39 mg/dL — ABNORMAL LOW (ref >39)
LDL Calculated: 151 mg/dL — ABNORMAL HIGH (ref 0–99)
Triglycerides: 332 mg/dL — ABNORMAL HIGH (ref 0–149)
VLDL Cholesterol Cal: 66 mg/dL — ABNORMAL HIGH (ref 5–40)

## 2014-12-13 MED ORDER — FENOFIBRATE 145 MG PO TABS: 145.0000 mg | ORAL_TABLET | Freq: Every day | ORAL | Status: DC

## 2014-12-13 MED ORDER — ATORVASTATIN CALCIUM 40 MG PO TABS: 40.0000 mg | ORAL_TABLET | Freq: Every day | ORAL | Status: DC

## 2014-12-13 NOTE — Telephone Encounter (Signed)
Pt notified of results Verbalizes understanding 

## 2014-12-13 NOTE — Telephone Encounter (Signed)
-----   Message from [facility] [place] sent at 12/13/2014 10:00 AM EDT ----- Kidney and liver function stable Trig are way to high as well as ldl- added fenofibrate and statin to meds- rx sent to pharmacy Continue current meds- low fat diet and exercise and recheck in 3 months

## 2015-02-18 ENCOUNTER — Other Ambulatory Visit: Payer: Self-pay | Admitting: Nurse Practitioner

## 2015-02-18 ENCOUNTER — Encounter: Payer: Self-pay | Admitting: Nurse Practitioner

## 2015-02-18 MED ORDER — ZOLPIDEM TARTRATE 5 MG PO TABS: ORAL_TABLET | ORAL | Status: DC

## 2015-02-19 ENCOUNTER — Other Ambulatory Visit: Payer: Self-pay | Admitting: Nurse Practitioner

## 2015-02-23 ENCOUNTER — Telehealth: Payer: Self-pay | Admitting: Nurse Practitioner

## 2015-02-25 NOTE — Telephone Encounter (Signed)
rx called into pharmacy

## 2015-02-25 NOTE — Telephone Encounter (Signed)
Was done on 19- just call it in please

## 2015-04-29 ENCOUNTER — Other Ambulatory Visit: Payer: Self-pay | Admitting: Nurse Practitioner

## 2015-04-29 NOTE — Telephone Encounter (Signed)
Please call in xanax with 0 refills 

## 2015-04-29 NOTE — Telephone Encounter (Signed)
Last seen 12/12/14  MMM If approved route to nurse to call into [facility]

## 2015-04-29 NOTE — Telephone Encounter (Signed)
rx called into pharmacy

## 2015-05-30 ENCOUNTER — Encounter: Payer: Self-pay | Admitting: Nurse Practitioner

## 2015-05-30 ENCOUNTER — Ambulatory Visit (INDEPENDENT_AMBULATORY_CARE_PROVIDER_SITE_OTHER): Payer: 59 | Admitting: Nurse Practitioner

## 2015-05-30 VITALS — BP 148/92 | HR 90 | Temp 97.2°F | Ht 64.0 in | Wt 194.0 lb

## 2015-05-30 DIAGNOSIS — I10 Essential (primary) hypertension: Secondary | ICD-10-CM

## 2015-05-30 DIAGNOSIS — F32A Depression, unspecified: Secondary | ICD-10-CM

## 2015-05-30 DIAGNOSIS — F419 Anxiety disorder, unspecified: Secondary | ICD-10-CM | POA: Diagnosis not present

## 2015-05-30 DIAGNOSIS — Z1159 Encounter for screening for other viral diseases: Secondary | ICD-10-CM

## 2015-05-30 DIAGNOSIS — E785 Hyperlipidemia, unspecified: Secondary | ICD-10-CM | POA: Diagnosis not present

## 2015-05-30 DIAGNOSIS — Z6833 Body mass index (BMI) 33.0-33.9, adult: Secondary | ICD-10-CM

## 2015-05-30 DIAGNOSIS — G4700 Insomnia, unspecified: Secondary | ICD-10-CM | POA: Diagnosis not present

## 2015-05-30 DIAGNOSIS — F329 Major depressive disorder, single episode, unspecified: Secondary | ICD-10-CM | POA: Diagnosis not present

## 2015-05-30 DIAGNOSIS — Z1212 Encounter for screening for malignant neoplasm of rectum: Secondary | ICD-10-CM

## 2015-05-30 MED ORDER — ATORVASTATIN CALCIUM 40 MG PO TABS: 40.0000 mg | ORAL_TABLET | Freq: Every day | ORAL | Status: DC

## 2015-05-30 MED ORDER — ALPRAZOLAM 0.5 MG PO TABS: 0.5000 mg | ORAL_TABLET | Freq: Two times a day (BID) | ORAL | Status: DC | PRN

## 2015-05-30 MED ORDER — LISINOPRIL 40 MG PO TABS: 40.0000 mg | ORAL_TABLET | Freq: Every day | ORAL | Status: DC

## 2015-05-30 MED ORDER — FENOFIBRATE 145 MG PO TABS: 145.0000 mg | ORAL_TABLET | Freq: Every day | ORAL | Status: DC

## 2015-05-30 MED ORDER — ZOLPIDEM TARTRATE 5 MG PO TABS: ORAL_TABLET | ORAL | Status: DC

## 2015-05-30 NOTE — Progress Notes (Signed)
Subjective:    Patient ID: [name], female    DOB: 02-03-1956, 59 y.o.   MRN: [id number]   Patient here today for follow up of chronic medical problems.  Hypertension This is a chronic problem. The current episode started more than 1 year ago. The problem is unchanged. The problem is controlled. Risk factors for coronary artery disease include obesity. Past treatments include ACE inhibitors (Patient was started on ace 2 weeks ago. ). Compliance problems include diet and exercise.   Gad/depression/panic attacks Xanax as needed. Some days she only takes 1 tablet instead of two. Has not had in recent panic attacks insomnia ambien nightly- cannot sleep without it.      Review of Systems  Constitutional: Negative.   HENT: Negative.   Respiratory: Negative.   Cardiovascular: Negative.   Gastrointestinal: Negative.   Genitourinary: Negative.   Neurological: Negative.   Psychiatric/Behavioral: Negative.   All other systems reviewed and are negative.      Objective:   Physical Exam  Constitutional: She is oriented to person, place, and time. She appears well-developed and well-nourished.  HENT:  Nose: Nose normal.  Mouth/Throat: Oropharynx is clear and moist.  Eyes: EOM are normal.  Neck: Trachea normal, normal range of motion and full passive range of motion without pain. Neck supple. No JVD present. Carotid bruit is not present. No thyromegaly present.  Cardiovascular: Normal rate, regular rhythm, normal heart sounds and intact distal pulses.  Exam reveals no gallop and no friction rub.   No murmur heard. Pulmonary/Chest: Effort normal and breath sounds normal.  Abdominal: Soft. Bowel sounds are normal. She exhibits no distension and no mass. There is no tenderness.  Musculoskeletal: Normal range of motion.  Lymphadenopathy:    She has no cervical adenopathy.  Neurological: She is alert and oriented to person, place, and time. She has normal reflexes.  Skin: Skin is  warm and dry.  Psychiatric: She has a normal mood and affect. Her behavior is normal. Judgment and thought content normal.    BP 148/92 mmHg  Pulse 90  Temp(Src) 97.2 F (36.2 C) (Oral)  Ht _0  (1.626 m)  Wt 194 lb (87.998 kg)  BMI 33.28 kg/m2        Assessment & Plan:  1. Essential hypertension Do not add salt to diet - CMP14+EGFR - lisinopril (PRINIVIL,ZESTRIL) 40 MG tablet; Take 1 tablet (40 mg total) by mouth daily.  Dispense: 90 tablet; Refill: 3  2. Anxiety Stress management - ALPRAZolam (XANAX) 0.5 MG tablet; Take 1 tablet (0.5 mg total) by mouth 2 (two) times daily as needed.  Dispense: 60 tablet; Refill: 2  3. Depression Refuses antidepressant  4. Hyperlipidemia with target LDL less than 100 Low fat diet - Lipid panel - fenofibrate (TRICOR) 145 MG tablet; Take 1 tablet (145 mg total) by mouth daily.  Dispense: 30 tablet; Refill: 5 - atorvastatin (LIPITOR) 40 MG tablet; Take 1 tablet (40 mg total) by mouth daily.  Dispense: 30 tablet; Refill: 5  5. Insomnia Bedtime ritual - zolpidem (AMBIEN) 5 MG tablet; 1-2 po Qhs  Dispense: 60 tablet; Refill: 1  6. BMI 33.0-33.9,adult Discussed diet and exercise for person with BMI >25 Will recheck weight in 3-6 months  7. Screening for malignant neoplasm of the rectum - Fecal occult blood, imunochemical; Future  8. Need for hepatitis C screening test - HCV Antibody RFX to Quant PCR    Labs pending Health maintenance reviewed Diet and exercise encouraged Continue all meds Follow  up  In 3 months   [place], FNP

## 2015-05-30 NOTE — Patient Instructions (Signed)
Stress and Stress Management Stress is a normal reaction to life events. It is what you feel when life demands more than you are used to or more than you can handle. Some stress can be useful. For example, the stress reaction can help you catch the last bus of the day, study for a test, or meet a deadline at work. But stress that occurs too often or for too long can cause problems. It can affect your emotional health and interfere with relationships and normal daily activities. Too much stress can weaken your immune system and increase your risk for physical illness. If you already have a medical problem, stress can make it worse. CAUSES  All sorts of life events may cause stress. An event that causes stress for one person may not be stressful for another person. Major life events commonly cause stress. These may be positive or negative. Examples include losing your job, moving into a new home, getting married, having a baby, or losing a loved one. Less obvious life events may also cause stress, especially if they occur day after day or in combination. Examples include working long hours, driving in traffic, caring for children, being in debt, or being in a difficult relationship. SIGNS AND SYMPTOMS Stress may cause emotional symptoms including, the following:  Anxiety. This is feeling worried, afraid, on edge, overwhelmed, or out of control.  Anger. This is feeling irritated or impatient.  Depression. This is feeling sad, down, helpless, or guilty.  Difficulty focusing, remembering, or making decisions. Stress may cause physical symptoms, including the following:   Aches and pains. These may affect your head, neck, back, stomach, or other areas of your body.  Tight muscles or clenched jaw.  Low energy or trouble sleeping. Stress may cause unhealthy behaviors, including the following:   Eating to feel better (overeating) or skipping meals.  Sleeping too little, too much, or both.  Working  too much or putting off tasks (procrastination).  Smoking, drinking alcohol, or using drugs to feel better. DIAGNOSIS  Stress is diagnosed through an assessment by your health care provider. Your health care provider will ask questions about your symptoms and any stressful life events.Your health care provider will also ask about your medical history and may order blood tests or other tests. Certain medical conditions and medicine can cause physical symptoms similar to stress. Mental illness can cause emotional symptoms and unhealthy behaviors similar to stress. Your health care provider may refer you to a mental health professional for further evaluation.  TREATMENT  Stress management is the recommended treatment for stress.The goals of stress management are reducing stressful life events and coping with stress in healthy ways.  Techniques for reducing stressful life events include the following:  Stress identification. Self-monitor for stress and identify what causes stress for you. These skills may help you to avoid some stressful events.  Time management. Set your priorities, keep a calendar of events, and learn to say "no." These tools can help you avoid making too many commitments. Techniques for coping with stress include the following:  Rethinking the problem. Try to think realistically about stressful events rather than ignoring them or overreacting. Try to find the positives in a stressful situation rather than focusing on the negatives.  Exercise. Physical exercise can release both physical and emotional tension. The key is to find a form of exercise you enjoy and do it regularly.  Relaxation techniques. These relax the body and mind. Examples include yoga, meditation, tai chi, biofeedback, deep  breathing, progressive muscle relaxation, listening to music, being out in nature, journaling, and other hobbies. Again, the key is to find one or more that you enjoy and can do  regularly.  Healthy lifestyle. Eat a balanced diet, get plenty of sleep, and do not smoke. Avoid using alcohol or drugs to relax.  Strong support network. Spend time with family, friends, or other people you enjoy being around.Express your feelings and talk things over with someone you trust. Counseling or talktherapy with a mental health professional may be helpful if you are having difficulty managing stress on your own. Medicine is typically not recommended for the treatment of stress.Talk to your health care provider if you think you need medicine for symptoms of stress. HOME CARE INSTRUCTIONS  Keep all follow-up visits as directed by your health care provider.  Take all medicines as directed by your health care provider. SEEK MEDICAL CARE IF:  Your symptoms get worse or you start having new symptoms.  You feel overwhelmed by your problems and can no longer manage them on your own. SEEK IMMEDIATE MEDICAL CARE IF:  You feel like hurting yourself or someone else.   This information is not intended to replace advice given to you by your health care provider. Make sure you discuss any questions you have with your health care provider.   Document Released: 11/11/2000 Document Revised: 06/08/2014 Document Reviewed: 01/10/2013 Elsevier Interactive Patient Education 2016 [employer] Inc[employer]  

## 2015-06-01 LAB — CMP14+EGFR
ALBUMIN: 4.7 g/dL (ref 3.5–5.5)
ALK PHOS: 45 IU/L (ref 39–117)
ALT: 23 IU/L (ref 0–32)
AST: 17 IU/L (ref 0–40)
Albumin/Globulin Ratio: 2.1 (ref 1.1–2.5)
BUN / CREAT RATIO: 15 (ref 9–23)
BUN: 16 mg/dL (ref 6–24)
Bilirubin Total: 0.2 mg/dL (ref 0.0–1.2)
CO2: 25 mmol/L (ref 18–29)
CREATININE: 1.08 mg/dL — AB (ref 0.57–1.00)
Calcium: 9.3 mg/dL (ref 8.7–10.2)
Chloride: 103 mmol/L (ref 96–106)
GFR calc Af Amer: 65 mL/min/{1.73_m2} (ref >59)
GFR calc non Af Amer: 56 mL/min/{1.73_m2} — ABNORMAL LOW (ref >59)
GLUCOSE: 99 mg/dL (ref 65–99)
Globulin, Total: 2.2 g/dL (ref 1.5–4.5)
Potassium: 4.4 mmol/L (ref 3.5–5.2)
Sodium: 141 mmol/L (ref 134–144)
TOTAL PROTEIN: 6.9 g/dL (ref 6.0–8.5)

## 2015-06-01 LAB — LIPID PANEL
CHOLESTEROL TOTAL: 175 mg/dL (ref 100–199)
Chol/HDL Ratio: 3.9 ratio units (ref 0.0–4.4)
HDL: 45 mg/dL (ref >39)
LDL CALC: 105 mg/dL — AB (ref 0–99)
Triglycerides: 125 mg/dL (ref 0–149)
VLDL CHOLESTEROL CAL: 25 mg/dL (ref 5–40)

## 2015-06-01 LAB — HCV AB W REFLEX TO QUANT PCR: HCV Ab: 0.1 s/co ratio (ref 0.0–0.9)

## 2015-06-18 ENCOUNTER — Other Ambulatory Visit: Payer: Self-pay

## 2015-06-18 DIAGNOSIS — E785 Hyperlipidemia, unspecified: Secondary | ICD-10-CM

## 2015-06-18 MED ORDER — FENOFIBRATE 145 MG PO TABS: 145.0000 mg | ORAL_TABLET | Freq: Every day | ORAL | Status: DC

## 2015-06-18 MED ORDER — ATORVASTATIN CALCIUM 40 MG PO TABS: 40.0000 mg | ORAL_TABLET | Freq: Every day | ORAL | Status: DC

## 2015-06-25 ENCOUNTER — Other Ambulatory Visit: Payer: Self-pay | Admitting: Nurse Practitioner

## 2015-10-15 ENCOUNTER — Other Ambulatory Visit: Payer: Self-pay | Admitting: Nurse Practitioner

## 2015-10-16 NOTE — Telephone Encounter (Signed)
Patient last seen in office on 05-30-15. Rx last filled on 09-16-15 for #60. Please advise

## 2015-10-17 NOTE — Telephone Encounter (Signed)
Rx called in 

## 2015-10-17 NOTE — Telephone Encounter (Signed)
Please call in ambien with 1 refills 

## 2015-11-01 ENCOUNTER — Other Ambulatory Visit: Payer: Self-pay | Admitting: Family Medicine

## 2015-11-06 ENCOUNTER — Other Ambulatory Visit: Payer: Self-pay | Admitting: Nurse Practitioner

## 2015-11-06 DIAGNOSIS — E785 Hyperlipidemia, unspecified: Secondary | ICD-10-CM

## 2015-11-06 MED ORDER — FENOFIBRATE 145 MG PO TABS: 145.0000 mg | ORAL_TABLET | Freq: Every day | ORAL | Status: DC

## 2015-11-06 NOTE — Telephone Encounter (Signed)
done

## 2015-12-02 ENCOUNTER — Encounter: Payer: Self-pay | Admitting: Nurse Practitioner

## 2015-12-02 ENCOUNTER — Ambulatory Visit (INDEPENDENT_AMBULATORY_CARE_PROVIDER_SITE_OTHER): Payer: 59 | Admitting: Nurse Practitioner

## 2015-12-02 VITALS — BP 124/84 | HR 106 | Temp 97.1°F | Ht 64.0 in | Wt 194.0 lb

## 2015-12-02 DIAGNOSIS — F410 Panic disorder [episodic paroxysmal anxiety] without agoraphobia: Secondary | ICD-10-CM | POA: Diagnosis not present

## 2015-12-02 DIAGNOSIS — T148 Other injury of unspecified body region: Secondary | ICD-10-CM | POA: Diagnosis not present

## 2015-12-02 DIAGNOSIS — W57XXXA Bitten or stung by nonvenomous insect and other nonvenomous arthropods, initial encounter: Secondary | ICD-10-CM

## 2015-12-02 DIAGNOSIS — G4700 Insomnia, unspecified: Secondary | ICD-10-CM | POA: Diagnosis not present

## 2015-12-02 DIAGNOSIS — F419 Anxiety disorder, unspecified: Secondary | ICD-10-CM | POA: Diagnosis not present

## 2015-12-02 MED ORDER — DOXYCYCLINE HYCLATE 100 MG PO TABS: 100.0000 mg | ORAL_TABLET | Freq: Two times a day (BID) | ORAL | Status: DC

## 2015-12-02 MED ORDER — ALPRAZOLAM 0.5 MG PO TABS: 0.5000 mg | ORAL_TABLET | Freq: Two times a day (BID) | ORAL | Status: DC | PRN

## 2015-12-02 NOTE — Progress Notes (Signed)
   Subjective:    Patient ID: [name], female    DOB: March 24, 1956, 60 y.o.   MRN: [id number]  HPI Patient in today still c/o insomnia, fatigue and panic attacks- she has been out of work since [place] because she cannot handle the stress of the job. Since she has been out of work she is sleeping better and much calmer. SHe says there is just no way she can go back to work and function. The thoughts of going back to work make her nauseous and nervous.  C/O tick bite 4 weeks ago and now she is achy all over with chills and fever  Review of Systems  Constitutional: Negative.   HENT: Negative.   Respiratory: Negative.   Cardiovascular: Negative.   Genitourinary: Negative.   Neurological: Negative.   Psychiatric/Behavioral: Negative.   All other systems reviewed and are negative.      Objective:   Physical Exam  Constitutional: She is oriented to person, place, and time. She appears well-developed and well-nourished.  Cardiovascular: Normal rate, regular rhythm and normal heart sounds.   Pulmonary/Chest: Effort normal and breath sounds normal.  Neurological: She is alert and oriented to person, place, and time.  Skin: Skin is warm.  Psychiatric: She has a normal mood and affect. Her behavior is normal. Judgment and thought content normal.  Patient very anxious and gets shaky when she is talking about work.   BP 124/84 mmHg  Pulse 106  Temp(Src) 97.1 F (36.2 C) (Oral)  Ht 5\' 4"  (1.626 m)  Wt 194 lb (87.998 kg)  BMI 33.28 kg/m2       Assessment & Plan:  1. Anxiety Stress management - ALPRAZolam (XANAX) 0.5 MG tablet; Take 1 tablet (0.5 mg total) by mouth 2 (two) times daily as needed.  Dispense: 60 tablet; Refill: 2  2. Panic attacks  3. Insomnia Bedtime ritual  4. Tick bite Force fluids Rest RTO prn - doxycycline (VIBRA-TABS) 100 MG tablet; Take 1 tablet (100 mg total) by mouth 2 (two) times daily. 1 po bid  Dispense: 28 tablet; Refill: 0   [name] Done,  FNP

## 2015-12-02 NOTE — Patient Instructions (Signed)
Stress and Stress Management Stress is a normal reaction to life events. It is what you feel when life demands more than you are used to or more than you can handle. Some stress can be useful. For example, the stress reaction can help you catch the last bus of the day, study for a test, or meet a deadline at work. But stress that occurs too often or for too long can cause problems. It can affect your emotional health and interfere with relationships and normal daily activities. Too much stress can weaken your immune system and increase your risk for physical illness. If you already have a medical problem, stress can make it worse. CAUSES  All sorts of life events may cause stress. An event that causes stress for one person may not be stressful for another person. Major life events commonly cause stress. These may be positive or negative. Examples include losing your job, moving into a new home, getting married, having a baby, or losing a loved one. Less obvious life events may also cause stress, especially if they occur day after day or in combination. Examples include working long hours, driving in traffic, caring for children, being in debt, or being in a difficult relationship. SIGNS AND SYMPTOMS Stress may cause emotional symptoms including, the following:  Anxiety. This is feeling worried, afraid, on edge, overwhelmed, or out of control.  Anger. This is feeling irritated or impatient.  Depression. This is feeling sad, down, helpless, or guilty.  Difficulty focusing, remembering, or making decisions. Stress may cause physical symptoms, including the following:   Aches and pains. These may affect your head, neck, back, stomach, or other areas of your body.  Tight muscles or clenched jaw.  Low energy or trouble sleeping. Stress may cause unhealthy behaviors, including the following:   Eating to feel better (overeating) or skipping meals.  Sleeping too little, too much, or both.  Working  too much or putting off tasks (procrastination).  Smoking, drinking alcohol, or using drugs to feel better. DIAGNOSIS  Stress is diagnosed through an assessment by your health care provider. Your health care provider will ask questions about your symptoms and any stressful life events.Your health care provider will also ask about your medical history and may order blood tests or other tests. Certain medical conditions and medicine can cause physical symptoms similar to stress. Mental illness can cause emotional symptoms and unhealthy behaviors similar to stress. Your health care provider may refer you to a mental health professional for further evaluation.  TREATMENT  Stress management is the recommended treatment for stress.The goals of stress management are reducing stressful life events and coping with stress in healthy ways.  Techniques for reducing stressful life events include the following:  Stress identification. Self-monitor for stress and identify what causes stress for you. These skills may help you to avoid some stressful events.  Time management. Set your priorities, keep a calendar of events, and learn to say "no." These tools can help you avoid making too many commitments. Techniques for coping with stress include the following:  Rethinking the problem. Try to think realistically about stressful events rather than ignoring them or overreacting. Try to find the positives in a stressful situation rather than focusing on the negatives.  Exercise. Physical exercise can release both physical and emotional tension. The key is to find a form of exercise you enjoy and do it regularly.  Relaxation techniques. These relax the body and mind. Examples include yoga, meditation, tai chi, biofeedback, deep  breathing, progressive muscle relaxation, listening to music, being out in nature, journaling, and other hobbies. Again, the key is to find one or more that you enjoy and can do  regularly.  Healthy lifestyle. Eat a balanced diet, get plenty of sleep, and do not smoke. Avoid using alcohol or drugs to relax.  Strong support network. Spend time with family, friends, or other people you enjoy being around.Express your feelings and talk things over with someone you trust. Counseling or talktherapy with a mental health professional may be helpful if you are having difficulty managing stress on your own. Medicine is typically not recommended for the treatment of stress.Talk to your health care provider if you think you need medicine for symptoms of stress. HOME CARE INSTRUCTIONS  Keep all follow-up visits as directed by your health care provider.  Take all medicines as directed by your health care provider. SEEK MEDICAL CARE IF:  Your symptoms get worse or you start having new symptoms.  You feel overwhelmed by your problems and can no longer manage them on your own. SEEK IMMEDIATE MEDICAL CARE IF:  You feel like hurting yourself or someone else.   This information is not intended to replace advice given to you by your health care provider. Make sure you discuss any questions you have with your health care provider.   Document Released: 11/11/2000 Document Revised: 06/08/2014 Document Reviewed: 01/10/2013 Elsevier Interactive Patient Education 2016 [employer] Inc[employer]  

## 2016-02-18 ENCOUNTER — Ambulatory Visit: Payer: 59 | Admitting: Nurse Practitioner

## 2016-02-20 ENCOUNTER — Ambulatory Visit (INDEPENDENT_AMBULATORY_CARE_PROVIDER_SITE_OTHER): Payer: 59 | Admitting: Nurse Practitioner

## 2016-02-20 ENCOUNTER — Encounter: Payer: Self-pay | Admitting: Nurse Practitioner

## 2016-02-20 VITALS — BP 146/94 | HR 91 | Temp 97.6°F | Ht 64.0 in | Wt 198.0 lb

## 2016-02-20 DIAGNOSIS — F419 Anxiety disorder, unspecified: Secondary | ICD-10-CM

## 2016-02-20 DIAGNOSIS — Z23 Encounter for immunization: Secondary | ICD-10-CM | POA: Diagnosis not present

## 2016-02-20 NOTE — Progress Notes (Signed)
   Subjective:    Patient ID: [name], female    DOB: Aug 10, 1955, 60 y.o.   MRN: [id number]  HPI Patient is actually here today to discuss her FMLA paper work - she is out of work due to stress- says that she just cannot handle the stress anymore. We filled out paper work for her and she is upset because she is having to pay for her insurance out of pocket. SHe thinks that she is having to do that due to what was writtten on FMLA papers. Patient says that she is still having problems sleeping,even though she has been out of work. SHe says that the [place] she has, does not help.     Review of Systems  Constitutional: Negative.   HENT: Negative.   Respiratory: Negative.   Cardiovascular: Negative.   Genitourinary: Negative.   Neurological: Negative.   Psychiatric/Behavioral: Negative.   All other systems reviewed and are negative.      Objective:   Physical Exam  Constitutional: She is oriented to person, place, and time. She appears well-developed and well-nourished.  Cardiovascular: Normal rate, regular rhythm and normal heart sounds.   Pulmonary/Chest: Effort normal and breath sounds normal.  Neurological: She is alert and oriented to person, place, and time.  Skin: Skin is warm.  Psychiatric: She has a normal mood and affect. Her behavior is normal. Judgment and thought content normal.  Very anxious and talkative   BP (!) 146/94 (BP Location: Right Arm, Cuff Size: Normal)   Pulse 91   Temp 97.6 F (36.4 C) (Oral)   Ht 5\' 4"  (1.626 m)   Wt 198 lb (89.8 kg)   BMI 33.99 kg/m          Assessment & Plan:   1. Anxiety    No change to condition Stress management encouraged RTO prn  mini-mental status exam

## 2016-02-20 NOTE — Patient Instructions (Signed)
Stress and Stress Management Stress is a normal reaction to life events. It is what you feel when life demands more than you are used to or more than you can handle. Some stress can be useful. For example, the stress reaction can help you catch the last bus of the day, study for a test, or meet a deadline at work. But stress that occurs too often or for too long can cause problems. It can affect your emotional health and interfere with relationships and normal daily activities. Too much stress can weaken your immune system and increase your risk for physical illness. If you already have a medical problem, stress can make it worse. CAUSES  All sorts of life events may cause stress. An event that causes stress for one person may not be stressful for another person. Major life events commonly cause stress. These may be positive or negative. Examples include losing your job, moving into a new home, getting married, having a baby, or losing a loved one. Less obvious life events may also cause stress, especially if they occur day after day or in combination. Examples include working long hours, driving in traffic, caring for children, being in debt, or being in a difficult relationship. SIGNS AND SYMPTOMS Stress may cause emotional symptoms including, the following:  Anxiety. This is feeling worried, afraid, on edge, overwhelmed, or out of control.  Anger. This is feeling irritated or impatient.  Depression. This is feeling sad, down, helpless, or guilty.  Difficulty focusing, remembering, or making decisions. Stress may cause physical symptoms, including the following:   Aches and pains. These may affect your head, neck, back, stomach, or other areas of your body.  Tight muscles or clenched jaw.  Low energy or trouble sleeping. Stress may cause unhealthy behaviors, including the following:   Eating to feel better (overeating) or skipping meals.  Sleeping too little, too much, or both.  Working  too much or putting off tasks (procrastination).  Smoking, drinking alcohol, or using drugs to feel better. DIAGNOSIS  Stress is diagnosed through an assessment by your health care provider. Your health care provider will ask questions about your symptoms and any stressful life events.Your health care provider will also ask about your medical history and may order blood tests or other tests. Certain medical conditions and medicine can cause physical symptoms similar to stress. Mental illness can cause emotional symptoms and unhealthy behaviors similar to stress. Your health care provider may refer you to a mental health professional for further evaluation.  TREATMENT  Stress management is the recommended treatment for stress.The goals of stress management are reducing stressful life events and coping with stress in healthy ways.  Techniques for reducing stressful life events include the following:  Stress identification. Self-monitor for stress and identify what causes stress for you. These skills may help you to avoid some stressful events.  Time management. Set your priorities, keep a calendar of events, and learn to say "no." These tools can help you avoid making too many commitments. Techniques for coping with stress include the following:  Rethinking the problem. Try to think realistically about stressful events rather than ignoring them or overreacting. Try to find the positives in a stressful situation rather than focusing on the negatives.  Exercise. Physical exercise can release both physical and emotional tension. The key is to find a form of exercise you enjoy and do it regularly.  Relaxation techniques. These relax the body and mind. Examples include yoga, meditation, tai chi, biofeedback, deep  breathing, progressive muscle relaxation, listening to music, being out in nature, journaling, and other hobbies. Again, the key is to find one or more that you enjoy and can do  regularly.  Healthy lifestyle. Eat a balanced diet, get plenty of sleep, and do not smoke. Avoid using alcohol or drugs to relax.  Strong support network. Spend time with family, friends, or other people you enjoy being around.Express your feelings and talk things over with someone you trust. Counseling or talktherapy with a mental health professional may be helpful if you are having difficulty managing stress on your own. Medicine is typically not recommended for the treatment of stress.Talk to your health care provider if you think you need medicine for symptoms of stress. HOME CARE INSTRUCTIONS  Keep all follow-up visits as directed by your health care provider.  Take all medicines as directed by your health care provider. SEEK MEDICAL CARE IF:  Your symptoms get worse or you start having new symptoms.  You feel overwhelmed by your problems and can no longer manage them on your own. SEEK IMMEDIATE MEDICAL CARE IF:  You feel like hurting yourself or someone else.   This information is not intended to replace advice given to you by your health care provider. Make sure you discuss any questions you have with your health care provider.   Document Released: 11/11/2000 Document Revised: 06/08/2014 Document Reviewed: 01/10/2013 Elsevier Interactive Patient Education 2016 [employer] Inc[employer]  

## 2016-02-21 ENCOUNTER — Ambulatory Visit: Payer: 59 | Admitting: Nurse Practitioner

## 2016-03-12 ENCOUNTER — Other Ambulatory Visit: Payer: Self-pay | Admitting: Nurse Practitioner

## 2016-03-12 DIAGNOSIS — E785 Hyperlipidemia, unspecified: Secondary | ICD-10-CM

## 2016-04-07 ENCOUNTER — Encounter: Payer: Self-pay | Admitting: Nurse Practitioner

## 2016-04-07 ENCOUNTER — Ambulatory Visit (INDEPENDENT_AMBULATORY_CARE_PROVIDER_SITE_OTHER): Payer: 59 | Admitting: Nurse Practitioner

## 2016-04-07 VITALS — BP 142/82 | HR 74 | Temp 98.6°F | Ht 64.0 in | Wt 203.0 lb

## 2016-04-07 DIAGNOSIS — F410 Panic disorder [episodic paroxysmal anxiety] without agoraphobia: Secondary | ICD-10-CM

## 2016-04-07 DIAGNOSIS — N909 Noninflammatory disorder of vulva and perineum, unspecified: Secondary | ICD-10-CM

## 2016-04-07 DIAGNOSIS — F419 Anxiety disorder, unspecified: Secondary | ICD-10-CM

## 2016-04-07 DIAGNOSIS — F5101 Primary insomnia: Secondary | ICD-10-CM

## 2016-04-07 DIAGNOSIS — E785 Hyperlipidemia, unspecified: Secondary | ICD-10-CM

## 2016-04-07 DIAGNOSIS — I10 Essential (primary) hypertension: Secondary | ICD-10-CM

## 2016-04-07 DIAGNOSIS — Z0000 Encounter for general adult medical examination without abnormal findings: Secondary | ICD-10-CM

## 2016-04-07 DIAGNOSIS — C329 Malignant neoplasm of larynx, unspecified: Secondary | ICD-10-CM

## 2016-04-07 DIAGNOSIS — Z6834 Body mass index (BMI) 34.0-34.9, adult: Secondary | ICD-10-CM

## 2016-04-07 DIAGNOSIS — E039 Hypothyroidism, unspecified: Secondary | ICD-10-CM

## 2016-04-07 DIAGNOSIS — F3341 Major depressive disorder, recurrent, in partial remission: Secondary | ICD-10-CM

## 2016-04-07 DIAGNOSIS — Z01419 Encounter for gynecological examination (general) (routine) without abnormal findings: Secondary | ICD-10-CM | POA: Diagnosis not present

## 2016-04-07 LAB — URINALYSIS, COMPLETE
Bilirubin, UA: NEGATIVE
Glucose, UA: NEGATIVE
Ketones, UA: NEGATIVE
NITRITE UA: NEGATIVE
PH UA: 5.5 (ref 5.0–7.5)
Protein, UA: NEGATIVE
Specific Gravity, UA: 1.02 (ref 1.005–1.030)
UUROB: 0.2 mg/dL (ref 0.2–1.0)

## 2016-04-07 LAB — MICROSCOPIC EXAMINATION

## 2016-04-07 MED ORDER — ATORVASTATIN CALCIUM 40 MG PO TABS: ORAL_TABLET | ORAL | 1 refills | Status: DC

## 2016-04-07 MED ORDER — LEVOTHYROXINE SODIUM 50 MCG PO TABS: 50.0000 ug | ORAL_TABLET | Freq: Every day | ORAL | 1 refills | Status: DC

## 2016-04-07 MED ORDER — FENOFIBRATE 145 MG PO TABS: ORAL_TABLET | ORAL | 1 refills | Status: DC

## 2016-04-07 MED ORDER — ALPRAZOLAM 0.5 MG PO TABS: 0.5000 mg | ORAL_TABLET | Freq: Two times a day (BID) | ORAL | 2 refills | Status: DC | PRN

## 2016-04-07 MED ORDER — ZOLPIDEM TARTRATE 5 MG PO TABS: ORAL_TABLET | ORAL | 2 refills | Status: DC

## 2016-04-07 MED ORDER — LISINOPRIL 40 MG PO TABS: 40.0000 mg | ORAL_TABLET | Freq: Every day | ORAL | 3 refills | Status: DC

## 2016-04-07 NOTE — Patient Instructions (Signed)
Stress and Stress Management Stress is a normal reaction to life events. It is what you feel when life demands more than you are used to or more than you can handle. Some stress can be useful. For example, the stress reaction can help you catch the last bus of the day, study for a test, or meet a deadline at work. But stress that occurs too often or for too long can cause problems. It can affect your emotional health and interfere with relationships and normal daily activities. Too much stress can weaken your immune system and increase your risk for physical illness. If you already have a medical problem, stress can make it worse. CAUSES  All sorts of life events may cause stress. An event that causes stress for one person may not be stressful for another person. Major life events commonly cause stress. These may be positive or negative. Examples include losing your job, moving into a new home, getting married, having a baby, or losing a loved one. Less obvious life events may also cause stress, especially if they occur day after day or in combination. Examples include working long hours, driving in traffic, caring for children, being in debt, or being in a difficult relationship. SIGNS AND SYMPTOMS Stress may cause emotional symptoms including, the following:  Anxiety. This is feeling worried, afraid, on edge, overwhelmed, or out of control.  Anger. This is feeling irritated or impatient.  Depression. This is feeling sad, down, helpless, or guilty.  Difficulty focusing, remembering, or making decisions. Stress may cause physical symptoms, including the following:   Aches and pains. These may affect your head, neck, back, stomach, or other areas of your body.  Tight muscles or clenched jaw.  Low energy or trouble sleeping. Stress may cause unhealthy behaviors, including the following:   Eating to feel better (overeating) or skipping meals.  Sleeping too little, too much, or both.  Working  too much or putting off tasks (procrastination).  Smoking, drinking alcohol, or using drugs to feel better. DIAGNOSIS  Stress is diagnosed through an assessment by your health care provider. Your health care provider will ask questions about your symptoms and any stressful life events.Your health care provider will also ask about your medical history and may order blood tests or other tests. Certain medical conditions and medicine can cause physical symptoms similar to stress. Mental illness can cause emotional symptoms and unhealthy behaviors similar to stress. Your health care provider may refer you to a mental health professional for further evaluation.  TREATMENT  Stress management is the recommended treatment for stress.The goals of stress management are reducing stressful life events and coping with stress in healthy ways.  Techniques for reducing stressful life events include the following:  Stress identification. Self-monitor for stress and identify what causes stress for you. These skills may help you to avoid some stressful events.  Time management. Set your priorities, keep a calendar of events, and learn to say "no." These tools can help you avoid making too many commitments. Techniques for coping with stress include the following:  Rethinking the problem. Try to think realistically about stressful events rather than ignoring them or overreacting. Try to find the positives in a stressful situation rather than focusing on the negatives.  Exercise. Physical exercise can release both physical and emotional tension. The key is to find a form of exercise you enjoy and do it regularly.  Relaxation techniques. These relax the body and mind. Examples include yoga, meditation, tai chi, biofeedback, deep  breathing, progressive muscle relaxation, listening to music, being out in nature, journaling, and other hobbies. Again, the key is to find one or more that you enjoy and can do  regularly.  Healthy lifestyle. Eat a balanced diet, get plenty of sleep, and do not smoke. Avoid using alcohol or drugs to relax.  Strong support network. Spend time with family, friends, or other people you enjoy being around.Express your feelings and talk things over with someone you trust. Counseling or talktherapy with a mental health professional may be helpful if you are having difficulty managing stress on your own. Medicine is typically not recommended for the treatment of stress.Talk to your health care provider if you think you need medicine for symptoms of stress. HOME CARE INSTRUCTIONS  Keep all follow-up visits as directed by your health care provider.  Take all medicines as directed by your health care provider. SEEK MEDICAL CARE IF:  Your symptoms get worse or you start having new symptoms.  You feel overwhelmed by your problems and can no longer manage them on your own. SEEK IMMEDIATE MEDICAL CARE IF:  You feel like hurting yourself or someone else.   This information is not intended to replace advice given to you by your health care provider. Make sure you discuss any questions you have with your health care provider.   Document Released: 11/11/2000 Document Revised: 06/08/2014 Document Reviewed: 01/10/2013 Elsevier Interactive Patient Education 2016 [employer] Inc[employer]  

## 2016-04-07 NOTE — Addendum Note (Signed)
Addended by: Rolena Infante on: 04/07/2016 10:23 AM   Modules accepted: Orders

## 2016-04-07 NOTE — Progress Notes (Signed)
Subjective:    Patient ID: [name], female    DOB: 03-26-1956, 60 y.o.   MRN: [id number]   Patient here today for annual physical exam and PAP along with follow up of chronic medical problems.  Hypertension  This is a chronic problem. The current episode started more than 1 year ago. The problem is unchanged. The problem is controlled. Risk factors for coronary artery disease include obesity. Past treatments include ACE inhibitors (Patient was started on ace 2 weeks ago. ). Compliance problems include diet and exercise.  Hypertensive end-organ damage includes a thyroid problem. There is no history of CAD/MI or CVA.  Hyperlipidemia  This is a chronic problem. The current episode started more than 1 year ago. Recent lipid tests were reviewed and are variable. Exacerbating diseases include obesity. She has no history of diabetes or hypothyroidism. Current antihyperlipidemic treatment includes statins. The current treatment provides mild improvement of lipids. Compliance problems include adherence to diet and adherence to exercise.  Risk factors for coronary artery disease include dyslipidemia, hypertension, obesity and post-menopausal.  Thyroid Problem  Presents for follow-up (Patient saw oncologist for her follow  up of laryngeal and colon cancer and he did labs which showed elevated TSH. Started on meds- needs levels rechecked.) visit. Symptoms include anxiety and depressed mood. The symptoms have been improving. Her past medical history is significant for hyperlipidemia. There is no history of diabetes.  Gad/depression/panic attacks Xanax as needed. SHe has to take 2 a day to keep her calm. SHe has been out of work for almost 6 most- says that work made her stress level worse. NO panic attacks since she quit work. insomnia ambien nightly- cannot sleep without it.    Review of Systems  Constitutional: Negative.   HENT: Negative.   Respiratory: Negative.   Cardiovascular: Negative.     Gastrointestinal: Negative.   Genitourinary: Negative.   Neurological: Negative.   Psychiatric/Behavioral: The patient is nervous/anxious.   All other systems reviewed and are negative.      Objective:   Physical Exam  Constitutional: She is oriented to person, place, and time. She appears well-developed and well-nourished.  HENT:  Head: Normocephalic.  Right Ear: Hearing, tympanic membrane, external ear and ear canal normal.  Left Ear: Hearing, tympanic membrane, external ear and ear canal normal.  Nose: Nose normal.  Mouth/Throat: Uvula is midline and oropharynx is clear and moist.  Eyes: Conjunctivae and EOM are normal. Pupils are equal, round, and reactive to light.  Neck: Trachea normal, normal range of motion and full passive range of motion without pain. Neck supple. No JVD present. Carotid bruit is not present. No thyroid mass and no thyromegaly present.  Cardiovascular: Normal rate, regular rhythm, normal heart sounds and intact distal pulses.  Exam reveals no gallop and no friction rub.   No murmur heard. Pulmonary/Chest: Effort normal and breath sounds normal. Right breast exhibits no inverted nipple, no mass, no nipple discharge, no skin change and no tenderness. Left breast exhibits no inverted nipple, no mass, no nipple discharge, no skin change and no tenderness.  Abdominal: Soft. Bowel sounds are normal. She exhibits no distension and no mass. There is no tenderness.  Genitourinary: Vagina normal and uterus normal. No breast swelling, tenderness, discharge or bleeding.  Genitourinary Comments: bimanual exam-No adnexal masses or tenderness. Vaginal cuff intact Vaginal adhesion posterior vaginal opening with small tiny white lesion surrounding posterior opening  Musculoskeletal: Normal range of motion.  Lymphadenopathy:    She has no cervical  adenopathy.  Neurological: She is alert and oriented to person, place, and time. She has normal reflexes.  Skin: Skin is warm and  dry.  Psychiatric: She has a normal mood and affect. Her behavior is normal. Judgment and thought content normal.    BP (!) 142/82 (BP Location: Left Arm, Cuff Size: Normal)   Pulse 74   Temp 98.6 F (37 C) (Oral)   Ht 5' 4"  (1.626 m)   Wt 203 lb (92.1 kg)   BMI 34.84 kg/m        Assessment & Plan:  1. Annual physical exam - CBC with Differential/Platelet  2. Encounter for gynecological examination without abnormal finding - IGP, Aptima HPV, rfx 16/18,45  3. Essential hypertension Low sodium diet - lisinopril (PRINIVIL,ZESTRIL) 40 MG tablet; Take 1 tablet (40 mg total) by mouth daily.  Dispense: 90 tablet; Refill: 3 - CMP14+EGFR  4. Laryngeal cancer ([place]) Keep follow up with oncologist  5. Anxiety Stress management discussed - ALPRAZolam (XANAX) 0.5 MG tablet; Take 1 tablet (0.5 mg total) by mouth 2 (two) times daily as needed.  Dispense: 60 tablet; Refill: 2  6. BMI 34.0-34.9,adult Discussed diet and exercise for person with BMI >25 Will recheck weight in 3-6 months  7. Recurrent major depressive disorder, in partial remission ([place]) Refuses antidepressant  8. Hyperlipidemia with target LDL less than 100 Low fat diet - fenofibrate (TRICOR) 145 MG tablet; TAKE 1 TABLET DAILY. PLEASEMAKE AN APPOINTMENT WITH   YOUR DOCTOR BEFORE NEXT    REFILL  Dispense: 90 tablet; Refill: 1 - atorvastatin (LIPITOR) 40 MG tablet; TAKE 1 TABLET DAILY        (PATIENT MUST SEEN BEFORE  NEXT REFILL)  Dispense: 90 tablet; Refill: 1 - Lipid panel  9. Primary insomnia Bedtime routine - zolpidem (AMBIEN) 5 MG tablet; TAKE 1 TO 2 TABLETS AT BEDTIME  Dispense: 60 tablet; Refill: 2  10. Panic attacks  11. Acquired hypothyroidism Labs to be redrawn - levothyroxine (SYNTHROID, LEVOTHROID) 50 MCG tablet; Take 1 tablet (50 mcg total) by mouth daily before breakfast.  Dispense: 90 tablet; Refill: 1 - Thyroid Panel With TSH  12. Lesion of female perineum Will watch and wait on pap  results After has CT will Bx areas if still there.    Labs pending Health maintenance reviewed Diet and exercise encouraged Continue all meds Follow up  In 3 months   [place], FNP

## 2016-04-08 ENCOUNTER — Other Ambulatory Visit: Payer: Self-pay | Admitting: Nurse Practitioner

## 2016-04-08 LAB — CMP14+EGFR
ALBUMIN: 4.4 g/dL (ref 3.6–4.8)
ALT: 18 IU/L (ref 0–32)
AST: 12 IU/L (ref 0–40)
Albumin/Globulin Ratio: 2.2 (ref 1.2–2.2)
Alkaline Phosphatase: 49 IU/L (ref 39–117)
BUN/Creatinine Ratio: 13 (ref 12–28)
BUN: 13 mg/dL (ref 8–27)
Bilirubin Total: <0.2 mg/dL (ref 0.0–1.2)
CALCIUM: 9 mg/dL (ref 8.7–10.3)
CHLORIDE: 107 mmol/L — AB (ref 96–106)
CO2: 22 mmol/L (ref 18–29)
CREATININE: 0.99 mg/dL (ref 0.57–1.00)
GFR, EST AFRICAN AMERICAN: 72 mL/min/{1.73_m2} (ref >59)
GFR, EST NON AFRICAN AMERICAN: 62 mL/min/{1.73_m2} (ref >59)
GLUCOSE: 126 mg/dL — AB (ref 65–99)
Globulin, Total: 2 g/dL (ref 1.5–4.5)
Potassium: 4 mmol/L (ref 3.5–5.2)
Sodium: 145 mmol/L — ABNORMAL HIGH (ref 134–144)
TOTAL PROTEIN: 6.4 g/dL (ref 6.0–8.5)

## 2016-04-08 LAB — LIPID PANEL
CHOL/HDL RATIO: 4.3 ratio (ref 0.0–4.4)
Cholesterol, Total: 147 mg/dL (ref 100–199)
HDL: 34 mg/dL — AB (ref >39)
LDL CALC: 89 mg/dL (ref 0–99)
Triglycerides: 120 mg/dL (ref 0–149)
VLDL CHOLESTEROL CAL: 24 mg/dL (ref 5–40)

## 2016-04-08 LAB — CBC WITH DIFFERENTIAL/PLATELET
Basophils Absolute: 0 10*3/uL (ref 0.0–0.2)
Basos: 0 %
EOS (ABSOLUTE): 0.2 10*3/uL (ref 0.0–0.4)
EOS: 4 %
HEMATOCRIT: 36.2 % (ref 34.0–46.6)
Hemoglobin: 11.8 g/dL (ref 11.1–15.9)
Immature Grans (Abs): 0 10*3/uL (ref 0.0–0.1)
Immature Granulocytes: 1 %
LYMPHS ABS: 0.8 10*3/uL (ref 0.7–3.1)
Lymphs: 15 %
MCH: 28.8 pg (ref 26.6–33.0)
MCHC: 32.6 g/dL (ref 31.5–35.7)
MCV: 88 fL (ref 79–97)
MONOS ABS: 0.4 10*3/uL (ref 0.1–0.9)
Monocytes: 7 %
Neutrophils Absolute: 4.1 10*3/uL (ref 1.4–7.0)
Neutrophils: 73 %
PLATELETS: 238 10*3/uL (ref 150–379)
RBC: 4.1 x10E6/uL (ref 3.77–5.28)
RDW: 15.1 % (ref 12.3–15.4)
WBC: 5.5 10*3/uL (ref 3.4–10.8)

## 2016-04-08 LAB — THYROID PANEL WITH TSH
Free Thyroxine Index: 1.6 (ref 1.2–4.9)
T3 Uptake Ratio: 27 % (ref 24–39)
T4 TOTAL: 5.8 ug/dL (ref 4.5–12.0)
TSH: 5.26 u[IU]/mL — ABNORMAL HIGH (ref 0.450–4.500)

## 2016-04-08 MED ORDER — LEVOTHYROXINE SODIUM 75 MCG PO TABS: 75.0000 ug | ORAL_TABLET | Freq: Every day | ORAL | 5 refills | Status: DC

## 2016-04-09 ENCOUNTER — Other Ambulatory Visit: Payer: Self-pay | Admitting: *Deleted

## 2016-04-09 DIAGNOSIS — R7309 Other abnormal glucose: Secondary | ICD-10-CM

## 2016-04-11 LAB — IGP, APTIMA HPV, RFX 16/18,45
HPV APTIMA: NEGATIVE
PAP Smear Comment: 0

## 2016-04-13 ENCOUNTER — Other Ambulatory Visit: Payer: Self-pay | Admitting: *Deleted

## 2016-04-13 MED ORDER — LEVOTHYROXINE SODIUM 75 MCG PO TABS: 75.0000 ug | ORAL_TABLET | Freq: Every day | ORAL | 1 refills | Status: DC

## 2016-05-14 ENCOUNTER — Other Ambulatory Visit: Payer: Self-pay | Admitting: *Deleted

## 2016-05-14 DIAGNOSIS — E785 Hyperlipidemia, unspecified: Secondary | ICD-10-CM

## 2016-05-14 MED ORDER — ATORVASTATIN CALCIUM 40 MG PO TABS: ORAL_TABLET | ORAL | 1 refills | Status: DC

## 2016-05-14 MED ORDER — FENOFIBRATE 145 MG PO TABS: ORAL_TABLET | ORAL | 1 refills | Status: DC

## 2016-05-26 ENCOUNTER — Encounter: Payer: Self-pay | Admitting: Nurse Practitioner

## 2016-05-26 ENCOUNTER — Ambulatory Visit (INDEPENDENT_AMBULATORY_CARE_PROVIDER_SITE_OTHER): Payer: 59 | Admitting: Nurse Practitioner

## 2016-05-26 VITALS — BP 140/84 | HR 94 | Temp 97.0°F | Ht 64.0 in | Wt 203.0 lb

## 2016-05-26 DIAGNOSIS — J209 Acute bronchitis, unspecified: Secondary | ICD-10-CM | POA: Diagnosis not present

## 2016-05-26 MED ORDER — AZITHROMYCIN 500 MG PO TABS
ORAL_TABLET | ORAL | 0 refills | Status: DC
Start: 2016-05-26 — End: 2016-06-02

## 2016-05-26 MED ORDER — PREDNISONE 10 MG (21) PO TBPK: ORAL_TABLET | ORAL | 0 refills | Status: DC

## 2016-05-26 MED ORDER — HYDROCODONE-HOMATROPINE 5-1.5 MG/5ML PO SYRP: 5.0000 mL | ORAL_SOLUTION | Freq: Four times a day (QID) | ORAL | 0 refills | Status: DC | PRN

## 2016-05-26 NOTE — Progress Notes (Signed)
Subjective:     [name] is a 60 y.o. female here for evaluation of a cough. Onset of symptoms was 1 month ago. Symptoms have been gradually worsening since that time. The cough is barky, dry, hoarse and productive of green sputum and is aggravated by reclining position. Associated symptoms include: change in voice and sputum production. Patient does not have a history of asthma. Patient does not have a history of environmental allergens. Patient has not traveled recently. Patient does have a history of smoking. Patient has not had a previous chest x-ray. Patient has not had a PPD done. * History of colon cancer and thrat cancer and they think it has spread to abdomne. She is currently going through testing with oncologist. The following portions of the patient's history were reviewed and updated as appropriate: allergies, current medications, past family history, past medical history, past social history, past surgical history and problem list.  Review of Systems Pertinent items noted in HPI and remainder of comprehensive ROS otherwise negative.    Objective:     BP 140/84   Pulse 94   Temp 97 F (36.1 C) (Oral)   Ht 5\' 4"  (1.626 m)   Wt 203 lb (92.1 kg)   BMI 34.84 kg/m  General appearance: alert and cooperative Eyes: conjunctivae/corneas clear. PERRL, EOM's intact. Fundi benign. Ears: normal TM's and external ear canals both ears Nose: clear discharge, moderate congestion, turbinates red, no sinus tenderness Throat: lips, mucosa, and tongue normal; teeth and gums normal Neck: no adenopathy, no carotid bruit, no JVD, supple, symmetrical, trachea midline and thyroid not enlarged, symmetric, no tenderness/mass/nodules Lungs: clear to auscultation bilaterally and dry cough Heart: regular rate and rhythm, S1, S2 normal, no murmur, click, rub or gallop    Assessment:    Acute Bronchitis    Plan:   1. Take meds as prescribed 2. Use a cool mist humidifier especially during the  winter months and when heat has been humid. 3. Use saline nose sprays frequently 4. Saline irrigations of the nose can be very helpful if done frequently.  * 4X daily for 1 week*  * Use of a nettie pot can be helpful with this. Follow directions with this* 5. Drink plenty of fluids 6. Keep thermostat turn down low 7.For any cough or congestion  Use plain Mucinex- regular strength or max strength is fine   * Children- consult with Pharmacist for dosing 8. For fever or aces or pains- take tylenol or ibuprofen appropriate for age and weight.  * for fevers greater than 101 orally you may alternate ibuprofen and tylenol every  3 hours.   Meds ordered this encounter  Medications  . azithromycin (ZITHROMAX) 500 MG tablet    Sig: As directed    Dispense:  6 tablet    Refill:  0    Order Specific Question:   Supervising Provider    Answer:   VINCENT, CAROL L [[id number]]  . HYDROcodone-homatropine (HYCODAN) 5-1.5 MG/5ML syrup    Sig: Take 5 mLs by mouth every 6 (six) hours as needed for cough.    Dispense:  240 mL    Refill:  0    Order Specific Question:   Supervising Provider    Answer:   VINCENT, CAROL L [[id number]]  . predniSONE (STERAPRED UNI-PAK 21 TAB) 10 MG (21) TBPK tablet    Sig: As directed x 6 days    Dispense:  21 tablet    Refill:  0    Order Specific  Question:   Supervising Provider    Answer:   Eustaquio Maize [place], FNP

## 2016-05-26 NOTE — Patient Instructions (Signed)

## 2016-06-02 ENCOUNTER — Encounter: Payer: Self-pay | Admitting: Nurse Practitioner

## 2016-06-02 ENCOUNTER — Ambulatory Visit (INDEPENDENT_AMBULATORY_CARE_PROVIDER_SITE_OTHER): Payer: 59 | Admitting: Nurse Practitioner

## 2016-06-02 VITALS — BP 124/82 | HR 108 | Temp 96.8°F | Ht 64.0 in | Wt 194.0 lb

## 2016-06-02 DIAGNOSIS — J0110 Acute frontal sinusitis, unspecified: Secondary | ICD-10-CM | POA: Diagnosis not present

## 2016-06-02 MED ORDER — LEVOFLOXACIN 500 MG PO TABS
500.0000 mg | ORAL_TABLET | Freq: Every day | ORAL | 0 refills | Status: DC
Start: 2016-06-02 — End: 2016-07-31

## 2016-06-02 NOTE — Patient Instructions (Signed)

## 2016-06-02 NOTE — Progress Notes (Signed)
   Subjective:    Patient ID: [name], female    DOB: 1956-01-05, 61 y.o.   MRN: [id number]  HPI  Patient come son today c/o cough and congestion- she was seen 05/26/16 and was diagnosed with bronchitis. She was given hydrocodan, z pak and prednisone dose pack. She took all of the z pak which she said gave her diarrhea. SHe says she is still having lots of facial pressure.   Review of Systems  Constitutional: Negative for fever.  HENT: Positive for congestion, sinus pain and sinus pressure.   Respiratory: Positive for cough (wet cough).   Cardiovascular: Negative.   Gastrointestinal: Positive for diarrhea (since been on z pak).  Genitourinary: Negative.   Neurological: Positive for headaches. Negative for dizziness.  Psychiatric/Behavioral: Negative.   All other systems reviewed and are negative.      Objective:   Physical Exam  Constitutional: She is oriented to person, place, and time. She appears well-developed and well-nourished. No distress.  HENT:  Right Ear: Hearing, tympanic membrane, external ear and ear canal normal.  Left Ear: Hearing, tympanic membrane, external ear and ear canal normal.  Nose: Mucosal edema and rhinorrhea present. Right sinus exhibits frontal sinus tenderness. Right sinus exhibits no maxillary sinus tenderness. Left sinus exhibits frontal sinus tenderness. Left sinus exhibits no maxillary sinus tenderness.  Mouth/Throat: Uvula is midline, oropharynx is clear and moist and mucous membranes are normal.  Neck: Normal range of motion. Neck supple.  Cardiovascular: Normal rate, regular rhythm and normal heart sounds.   Pulmonary/Chest: Effort normal. She has wheezes (ep wheezes bil bases).  Deep wet cough  Abdominal: Soft. Bowel sounds are normal.  Lymphadenopathy:    She has no cervical adenopathy.  Neurological: She is alert and oriented to person, place, and time.  Skin: Skin is warm.  Psychiatric: She has a normal mood and affect. Her behavior is  normal. Judgment and thought content normal.   BP 124/82   Pulse (!) 108   Temp (!) 96.8 F (36 C) (Oral)   Ht 5\' 4"  (1.626 m)   Wt 194 lb (88 kg)   BMI 33.30 kg/m      Assessment & Plan:   1. Acute non-recurrent frontal sinusitis    1. Take meds as prescribed 2. Use a cool mist humidifier especially during the winter months and when heat has been humid. 3. Use saline nose sprays frequently 4. Saline irrigations of the nose can be very helpful if done frequently.  * 4X daily for 1 week*  * Use of a nettie pot can be helpful with this. Follow directions with this* 5. Drink plenty of fluids 6. Keep thermostat turn down low 7.For any cough or congestion  Use plain Mucinex- regular strength or max strength is fine   * Children- consult with Pharmacist for dosing 8. For fever or aces or pains- take tylenol or ibuprofen appropriate for age and weight.  * for fevers greater than 101 orally you may alternate ibuprofen and tylenol every  3 hours.   Meds ordered this encounter  Medications  . levofloxacin (LEVAQUIN) 500 MG tablet    Sig: Take 1 tablet (500 mg total) by mouth daily.    Dispense:  7 tablet    Refill:  0    Order Specific Question:   Supervising Provider    Answer:   Eustaquio Maize [[id number]]   [name] Done, FNP

## 2016-07-31 ENCOUNTER — Ambulatory Visit (INDEPENDENT_AMBULATORY_CARE_PROVIDER_SITE_OTHER): Payer: 59 | Admitting: Pediatrics

## 2016-07-31 ENCOUNTER — Encounter: Payer: Self-pay | Admitting: Pediatrics

## 2016-07-31 ENCOUNTER — Other Ambulatory Visit: Payer: Self-pay | Admitting: Nurse Practitioner

## 2016-07-31 VITALS — BP 139/69 | HR 102 | Temp 98.4°F | Resp 22 | Ht 64.0 in | Wt 206.2 lb

## 2016-07-31 DIAGNOSIS — J441 Chronic obstructive pulmonary disease with (acute) exacerbation: Secondary | ICD-10-CM

## 2016-07-31 DIAGNOSIS — F419 Anxiety disorder, unspecified: Secondary | ICD-10-CM

## 2016-07-31 DIAGNOSIS — C329 Malignant neoplasm of larynx, unspecified: Secondary | ICD-10-CM | POA: Diagnosis not present

## 2016-07-31 DIAGNOSIS — I10 Essential (primary) hypertension: Secondary | ICD-10-CM

## 2016-07-31 DIAGNOSIS — R05 Cough: Secondary | ICD-10-CM

## 2016-07-31 DIAGNOSIS — F5101 Primary insomnia: Secondary | ICD-10-CM

## 2016-07-31 DIAGNOSIS — R059 Cough, unspecified: Secondary | ICD-10-CM

## 2016-07-31 MED ORDER — PREDNISONE 20 MG PO TABS: ORAL_TABLET | ORAL | 0 refills | Status: DC

## 2016-07-31 MED ORDER — GUAIFENESIN-CODEINE 100-10 MG/5ML PO SOLN: 5.0000 mL | Freq: Three times a day (TID) | ORAL | 0 refills | Status: DC | PRN

## 2016-07-31 MED ORDER — ALBUTEROL SULFATE HFA 108 (90 BASE) MCG/ACT IN AERS: 2.0000 | INHALATION_SPRAY | Freq: Four times a day (QID) | RESPIRATORY_TRACT | 0 refills | Status: DC | PRN

## 2016-07-31 MED ORDER — DOXYCYCLINE HYCLATE 100 MG PO TABS: 100.0000 mg | ORAL_TABLET | Freq: Two times a day (BID) | ORAL | 0 refills | Status: DC

## 2016-07-31 NOTE — Progress Notes (Signed)
  Subjective:   Patient ID: [name], female    DOB: 01-14-1956, 61 y.o.   MRN: [id number] CC: Cough  HPI: [name] is a 61 y.o. female presenting for Cough  Started getting sick 1.5 weeks ago Temp up to 99s Coughing since then Coughing more than usual Feeling more SOB More sputum production Usually doesn't have productive cough Cough keeping her awake now at night H/o emphysema   Has f/u appt with ENT next week for laryngeal cancer for surveillance Positive PET scan in dec at base of tongue and pre-sacral area Also with h/o rectal cancer Following with GI, ENT   Relevant past medical, surgical, family and social history reviewed. Allergies and medications reviewed and updated. History  Smoking Status  . Former Smoker  . Packs/day: 0.25  . Years: 40.00  . Types: Cigarettes  Smokeless Tobacco  . Not on file   ROS: Per HPI   Objective:    BP 139/69   Pulse (!) 102   Temp 98.4 F (36.9 C) (Oral)   Resp (!) 22   Ht 5\' 4"  (1.626 m)   Wt 206 lb 3.2 oz (93.5 kg)   SpO2 97%   BMI 35.39 kg/m   Wt Readings from Last 3 Encounters:  07/31/16 206 lb 3.2 oz (93.5 kg)  06/02/16 194 lb (88 kg)  05/26/16 203 lb (92.1 kg)    Gen: NAD, alert, cooperative with exam, NCAT EYES: EOMI, no conjunctival injection, or no icterus ENT:  TMs pearly gray b/l, OP without erythema CV: NRRR, normal S1/S2, no murmur, distal pulses 2+ b/l Resp: moving air fair, b/l expiratory wheezes, coughing frequently, no crackles, normal WOB Ext: No edema, warm Neuro: Alert and oriented  Assessment & Plan:  [name] was seen today for cough.  Diagnoses and all orders for this visit:  COPD exacerbation ([place]) Wheezing, O2 sats fine, coughing  Lot Increased sputum production, increased SOB, coughing more, change in sputum color Treat as below -     predniSONE (DELTASONE) 20 MG tablet; 2 po at same time daily for 3 days -     doxycycline (VIBRA-TABS) 100 MG tablet; Take 1 tablet (100 mg  total) by mouth 2 (two) times daily. -     albuterol (PROVENTIL HFA;VENTOLIN HFA) 108 (90 Base) MCG/ACT inhaler; Inhale 2 puffs into the lungs every 6 (six) hours as needed for wheezing or shortness of breath.  Cough -     guaiFENesin-codeine 100-10 MG/5ML syrup; Take 5 mLs by mouth 3 (three) times daily as needed for cough.  Laryngeal cancer [facility]) Has f/u with ENT next week, s/p treatment  Essential hypertension Slightly elevated today, on decongestants OTC   Follow up plan: As needed Assunta Found, MD [place]

## 2016-08-03 NOTE — Telephone Encounter (Signed)
Please call in alprazolam and xanax with 0 refills Last refill without being seen

## 2016-08-03 NOTE — Telephone Encounter (Signed)
Seen for chronic follow up (CPE) MMM 04/2016. If approved, have nurse phone in

## 2016-08-14 ENCOUNTER — Other Ambulatory Visit: Payer: Self-pay | Admitting: Nurse Practitioner

## 2016-08-14 DIAGNOSIS — I10 Essential (primary) hypertension: Secondary | ICD-10-CM

## 2016-09-08 ENCOUNTER — Other Ambulatory Visit: Payer: Self-pay | Admitting: Nurse Practitioner

## 2016-09-08 DIAGNOSIS — F5101 Primary insomnia: Secondary | ICD-10-CM

## 2016-09-08 DIAGNOSIS — F419 Anxiety disorder, unspecified: Secondary | ICD-10-CM

## 2016-09-08 NOTE — Telephone Encounter (Signed)
Forward to PCP/MMM 

## 2016-09-08 NOTE — Telephone Encounter (Signed)
Last filled 08/03/16, last seen 07/31/16. Route to pool for call in

## 2016-09-10 NOTE — Telephone Encounter (Signed)
Rx's called in . 

## 2016-09-10 NOTE — Telephone Encounter (Signed)
Please call in xanax and ambien with 0 refills 

## 2016-09-22 ENCOUNTER — Other Ambulatory Visit: Payer: Self-pay | Admitting: Nurse Practitioner

## 2016-10-20 ENCOUNTER — Other Ambulatory Visit: Payer: Self-pay | Admitting: Nurse Practitioner

## 2016-10-20 ENCOUNTER — Encounter: Payer: Self-pay | Admitting: Nurse Practitioner

## 2016-10-20 DIAGNOSIS — F5101 Primary insomnia: Secondary | ICD-10-CM

## 2016-10-20 DIAGNOSIS — F419 Anxiety disorder, unspecified: Secondary | ICD-10-CM

## 2016-10-23 ENCOUNTER — Other Ambulatory Visit: Payer: Self-pay | Admitting: Nurse Practitioner

## 2016-10-23 DIAGNOSIS — F5101 Primary insomnia: Secondary | ICD-10-CM

## 2016-10-23 DIAGNOSIS — F419 Anxiety disorder, unspecified: Secondary | ICD-10-CM

## 2016-10-23 NOTE — Telephone Encounter (Signed)
Last filled 09/10/16, last seen 07/31/16. Call both in

## 2016-10-30 ENCOUNTER — Telehealth: Payer: Self-pay | Admitting: Nurse Practitioner

## 2016-11-13 ENCOUNTER — Other Ambulatory Visit: Payer: Self-pay | Admitting: Nurse Practitioner

## 2016-11-13 DIAGNOSIS — I10 Essential (primary) hypertension: Secondary | ICD-10-CM

## 2016-11-13 DIAGNOSIS — E785 Hyperlipidemia, unspecified: Secondary | ICD-10-CM

## 2016-11-27 ENCOUNTER — Other Ambulatory Visit: Payer: Self-pay | Admitting: Nurse Practitioner

## 2016-11-27 DIAGNOSIS — F419 Anxiety disorder, unspecified: Secondary | ICD-10-CM

## 2016-11-27 DIAGNOSIS — F5101 Primary insomnia: Secondary | ICD-10-CM

## 2016-11-28 NOTE — Telephone Encounter (Signed)
Forward to PCP/MMMm

## 2016-11-30 NOTE — Telephone Encounter (Signed)
Please call in [place] and alprazolam with o refills

## 2016-11-30 NOTE — Telephone Encounter (Signed)
Rx called in 

## 2016-12-17 ENCOUNTER — Telehealth: Payer: Self-pay | Admitting: Nurse Practitioner

## 2016-12-28 ENCOUNTER — Other Ambulatory Visit: Payer: Self-pay | Admitting: Nurse Practitioner

## 2016-12-28 DIAGNOSIS — F419 Anxiety disorder, unspecified: Secondary | ICD-10-CM

## 2016-12-28 DIAGNOSIS — F5101 Primary insomnia: Secondary | ICD-10-CM

## 2016-12-29 NOTE — Telephone Encounter (Signed)
Was told with last refill that needs to be seen

## 2016-12-29 NOTE — Telephone Encounter (Signed)
Last seen 04/2016, both last filled 11/30/16. Route to pool

## 2016-12-30 NOTE — Telephone Encounter (Signed)
lmtcb

## 2017-01-15 ENCOUNTER — Ambulatory Visit (INDEPENDENT_AMBULATORY_CARE_PROVIDER_SITE_OTHER): Payer: 59 | Admitting: Nurse Practitioner

## 2017-01-15 ENCOUNTER — Encounter: Payer: Self-pay | Admitting: Nurse Practitioner

## 2017-01-15 VITALS — BP 141/95 | HR 93 | Temp 97.5°F | Ht 64.0 in | Wt 209.4 lb

## 2017-01-15 DIAGNOSIS — Z6833 Body mass index (BMI) 33.0-33.9, adult: Secondary | ICD-10-CM

## 2017-01-15 DIAGNOSIS — R05 Cough: Secondary | ICD-10-CM | POA: Diagnosis not present

## 2017-01-15 DIAGNOSIS — F410 Panic disorder [episodic paroxysmal anxiety] without agoraphobia: Secondary | ICD-10-CM | POA: Diagnosis not present

## 2017-01-15 DIAGNOSIS — F419 Anxiety disorder, unspecified: Secondary | ICD-10-CM

## 2017-01-15 DIAGNOSIS — E785 Hyperlipidemia, unspecified: Secondary | ICD-10-CM | POA: Diagnosis not present

## 2017-01-15 DIAGNOSIS — F5101 Primary insomnia: Secondary | ICD-10-CM | POA: Diagnosis not present

## 2017-01-15 DIAGNOSIS — E039 Hypothyroidism, unspecified: Secondary | ICD-10-CM | POA: Diagnosis not present

## 2017-01-15 DIAGNOSIS — F3341 Major depressive disorder, recurrent, in partial remission: Secondary | ICD-10-CM | POA: Diagnosis not present

## 2017-01-15 DIAGNOSIS — I10 Essential (primary) hypertension: Secondary | ICD-10-CM | POA: Diagnosis not present

## 2017-01-15 DIAGNOSIS — R059 Cough, unspecified: Secondary | ICD-10-CM

## 2017-01-15 MED ORDER — LOSARTAN POTASSIUM 100 MG PO TABS: 100.0000 mg | ORAL_TABLET | Freq: Every day | ORAL | 1 refills | Status: DC

## 2017-01-15 MED ORDER — LEVOTHYROXINE SODIUM 75 MCG PO TABS: ORAL_TABLET | ORAL | 1 refills | Status: DC

## 2017-01-15 MED ORDER — FENOFIBRATE 145 MG PO TABS: 145.0000 mg | ORAL_TABLET | Freq: Every day | ORAL | 1 refills | Status: DC

## 2017-01-15 MED ORDER — LISINOPRIL 40 MG PO TABS: 40.0000 mg | ORAL_TABLET | Freq: Every day | ORAL | 3 refills | Status: DC

## 2017-01-15 MED ORDER — ALPRAZOLAM 0.5 MG PO TABS: 0.5000 mg | ORAL_TABLET | Freq: Two times a day (BID) | ORAL | 2 refills | Status: DC

## 2017-01-15 MED ORDER — ZOLPIDEM TARTRATE 5 MG PO TABS: 5.0000 mg | ORAL_TABLET | Freq: Every evening | ORAL | 2 refills | Status: DC | PRN

## 2017-01-15 NOTE — Patient Instructions (Signed)
Cancer Survivorship and Stress Management Being diagnosed with cancer is very stressful. As a cancer survivor, you are likely facing a lot of health changes as well as challenges in many other areas of your life. How you handle the stress that comes with these challenges can make a big difference in your health and well-being. What types of stress are common for a cancer survivor? Cancer survivors may experience many types of stress:  Emotional stress. You may feel a wide range of emotions. At times, you may feel thankful and relieved, and other times you may feel angry, afraid, guilty, or depressed. You may also feel what is called survivor guilt because you survived when others with cancer did not. Cancer survivors, caregivers, and loved ones may also develop post-traumatic stress disorder (PTSD), an anxiety disorder that can affect people who have survived an event that has threatened their life.  Financial stress. Cancer can cause financial hardship in many ways, such as: ? Lost income from time off work. ? Treatment costs, especially for people who lack health insurance coverage. ? The cost of nursing care or child care if family support is lacking.  Interpersonal stress. When you have cancer, it affects your whole family. Family and friends may feel helpless. Spouses or others often take on more duties, and children may worry about you and the family's future. Your own stress may change the way you interact with others.  Work-related stress. Going back to work takes planning and can be tiring. Co-workers may not know what to say, or they may wonder how your return will affect them. Answering questions about your cancer and treatment may make you feel uncomfortable.  Why is it important to manage stress? When you experience high levels of stress over a long period, it can affect your mind and body. It may affect your body's ability to heal properly and to fight off illness. It can also affect  how your body responds to cancer treatments. Using strategies to cope with stress during and after cancer treatment can lead to lower levels of depression, anxiety, and other symptoms. How can I effectively manage stress? Finding ways to manage stress is an important part of living with cancer. Try these steps to help manage the various types of stress you may be experiencing: Emotional stress management Talk with your health care provider about your stress level and how you are managing it. Be honest. If you need help, your health care provider may refer you to another professional, such as a psychologist, social worker, chaplain, or psychiatrist. With help from your health care provider, you can also try:  Relaxation techniques such as meditation or deep breathing.  Counseling or talk therapy.  Medicines for depression or anxiety.  Writing about your feelings. Keeping a journal can help you let go of worries and fears.  Exercise or other physical activities. Talk with your health care provider about what activities are safe for you. Options may include: ? Walking for a few minutes each day. ? Dancing, gardening, or other enjoyable activities. ? Strength training. ? Yoga or tai chi.  Financial stress management  Talk with the hospital's social worker, business office, or financial manager. They may be able to help you set up a payment plan or may know of resources that can help with transportation, home care, and other expenses.  Talk with your insurance company about what kinds of treatment and care options are covered each step of the way. Ask if a case manager   can be assigned to you so you can talk with the same person each time that you have questions.  Talk with your health care provider about generic drugs, patient assistance programs, and other measures that can help reduce costs in your treatment plan.  Stay on track with your follow-up plan of care to help avoid more health  problems and hospital stays.  Reach out to your bank and the companies you owe. They may be able to set up payment plans and other options during financial hardship.  Look for support services that can help you cope financially. A good place to find resources is the National Cancer Institute website: https://supportorgs.cancer.gov  Organize your hospital bills, insurance statements, and other financial information in order to track and find information easily. For instance, you can have a separate folder for different kinds of paperwork. You can also organize folders electronically on your computer. Interpersonal stress management  Accept help when it is offered. Make a list of things you might need help with, and let others choose what they can do.  Ask for help if you need it. Friends and family may want to help but may feel uncomfortable offering. To give your caregivers a break, you could also look for paid help or check with organizations in your community, such as charities, religious organizations, or cancer support groups.  Do as much as you can for yourself, with approval from your health care provider. Staying active, taking care of yourself, being social, and doing things you enjoy can help you feel better sooner.  Let your caregivers, family, and friends know that you are thankful for them and their help. Work-related stress management  Do not take on too much too soon. Talk with your employer about options such as part-time hours, job sharing, unpaid time off, or frequent breaks to take medicine or to see a health care provider. You may be protected by laws for disabled employees.  Think about how much you want to tell your co-workers about your illness. Keeping your explanation simple and positive can help you and your co-workers feel more at ease.  If you are not yet comfortable with certain questions or comments, politely say so.  Try to think of any comments that might bother  you, and decide how you will respond. Contact a health care provider if:  Stress is interfering with your life and relationships.  You would like a referral to a mental health specialist or other resources.  You need help with transportation, home care, or other needs. Your health care team may know of resources in the community. Get help right away if:  You have a sense of panic or are overwhelmed with stress, sadness, or anxiety.  You have thoughts of wanting to die or harm yourself or others in some way. If you ever feel like you may hurt yourself or others, or have thoughts about taking your own life, get help right away. You can go to your nearest emergency department or call:  Your local emergency services (911 in the [place]  A suicide crisis helpline, such as the National Suicide Prevention Lifeline at 1-800-273-8255. This is open 24 hours a day.  Summary  Recovering from cancer is stressful. Using coping strategies may make a difference.  Your finances, relationships, and work life will change, and you may need help and time to adjust.  Seek and accept help from your health care provider, mental health professionals, loved ones, and community resources if needed.    Do what you can for yourself, and let others know that you are thankful for their help.  If you feel overwhelmed by stress, sadness, or anxiety, get professional help right away. This information is not intended to replace advice given to you by your health care provider. Make sure you discuss any questions you have with your health care provider. Document Released: 01/24/2016 Document Revised: 01/24/2016 Document Reviewed: 01/24/2016 Elsevier Interactive Patient Education  2018 Elsevier Inc.  

## 2017-01-15 NOTE — Addendum Note (Signed)
Addended by: Chevis Pretty on: 01/15/2017 02:49 PM   Modules accepted: Orders

## 2017-01-15 NOTE — Addendum Note (Signed)
Addended by: Chevis Pretty on: 01/15/2017 02:47 PM   Modules accepted: Orders

## 2017-01-15 NOTE — Progress Notes (Signed)
Subjective:    Patient ID: [name], female    DOB: 02-09-1956, 61 y.o.   MRN: [id number]  HPI  [name] is here today for follow up of chronic medical problem.  Outpatient Encounter Prescriptions as of 01/15/2017  Medication Sig  . albuterol (PROVENTIL HFA;VENTOLIN HFA) 108 (90 Base) MCG/ACT inhaler Inhale 2 puffs into the lungs every 6 (six) hours as needed for wheezing or shortness of breath.  . ALPRAZolam (XANAX) 0.5 MG tablet TAKE 1 TABLET BY MOUTH TWICE A DAY  . atorvastatin (LIPITOR) 40 MG tablet TAKE 1 TABLET DAILY  . doxycycline (VIBRA-TABS) 100 MG tablet Take 1 tablet (100 mg total) by mouth 2 (two) times daily.  . fenofibrate (TRICOR) 145 MG tablet TAKE 1 TABLET DAILY  . guaiFENesin-codeine 100-10 MG/5ML syrup Take 5 mLs by mouth 3 (three) times daily as needed for cough.  Marland Kitchen lisinopril (PRINIVIL,ZESTRIL) 40 MG tablet TAKE 1 TABLET DAILY  . predniSONE (DELTASONE) 20 MG tablet 2 po at same time daily for 3 days  . SYNTHROID 75 MCG tablet TAKE 1 TABLET DAILY BEFORE BREAKFAST DOSE CHANGE  . zolpidem (AMBIEN) 5 MG tablet TAKE 1 TO 2 TABLETS AT BEDTIME AS NEEDED   No facility-administered encounter medications on file as of 01/15/2017.     1. Essential hypertension  No c/o chest pain,SOB or headache. Does not check blood pressures at home. She stays so anxious that blood pressure would probably be high anyway  2. Panic attacks  Doing better since she retired from work.   3. Primary insomnia  Never sleeps well, despite taking ambien at night  4. Hyperlipidemia with target LDL less than 100  Does not watch diet at all-   5. Recurrent major depressive disorder, in partial remission ([place])   6. Anxiety  Stays anxious despite xanax  7. BMI 33.0-33.9,adult     New complaints: Still is unable  To sleep  Social history:  Lives with husband and grandchildren   Review of Systems  Constitutional: Negative for activity change and appetite change.  HENT: Negative.     Eyes: Negative for pain.  Respiratory: Negative for shortness of breath.   Cardiovascular: Negative for chest pain, palpitations and leg swelling.  Gastrointestinal: Negative for abdominal pain.  Endocrine: Negative for polydipsia.  Genitourinary: Negative.   Skin: Negative for rash.  Neurological: Negative for dizziness, weakness and headaches.  Hematological: Does not bruise/bleed easily.  Psychiatric/Behavioral: Negative.   All other systems reviewed and are negative.      Objective:   Physical Exam  Constitutional: She is oriented to person, place, and time. She appears well-developed and well-nourished.  HENT:  Nose: Nose normal.  Mouth/Throat: Oropharynx is clear and moist.  hoarseness   Eyes: EOM are normal.  Neck: Trachea normal, normal range of motion and full passive range of motion without pain. No JVD present. Carotid bruit is not present. No thyromegaly present.  Neck feels thick- due to history of radiation  Cardiovascular: Normal rate, regular rhythm, normal heart sounds and intact distal pulses.  Exam reveals no gallop and no friction rub.   No murmur heard. Pulmonary/Chest: Effort normal and breath sounds normal.  Abdominal: Soft. Bowel sounds are normal. She exhibits no distension and no mass. There is no tenderness.  Musculoskeletal: Normal range of motion.  Lymphadenopathy:    She has no cervical adenopathy.  Neurological: She is alert and oriented to person, place, and time. She has normal reflexes.  Skin: Skin is warm  and dry.  Psychiatric: She has a normal mood and affect. Her behavior is normal. Judgment and thought content normal.    BP (!) 141/95   Pulse 93   Temp (!) 97.5 F (36.4 C) (Oral)   Ht 5' 4"  (1.626 m)   Wt 209 lb 6.4 oz (95 kg)   BMI 35.94 kg/m      Assessment & Plan:  1. Essential hypertension Low sodium diet Stopped lisinopril due to cough yo see if will resolve - CMP14+EGFR - losartan (COZAAR) 100 MG tablet; Take 1 tablet  (100 mg total) by mouth daily.  Dispense: 90 tablet; Refill: 1  2. Panic attacks Stress management  3. Primary insomnia Bedtime routine - zolpidem (AMBIEN) 5 MG tablet; Take 1-2 tablets (5-10 mg total) by mouth at bedtime as needed.  Dispense: 60 tablet; Refill: 2  4. Hyperlipidemia with target LDL less than 100 Low fta diet - fenofibrate (TRICOR) 145 MG tablet; Take 1 tablet (145 mg total) by mouth daily.  Dispense: 90 tablet; Refill: 1 - Lipid panel  5. Recurrent major depressive disorder, in partial remission ([place]) Need to exercise nad get out of house more  6. Anxiety Stress management - ALPRAZolam (XANAX) 0.5 MG tablet; Take 1 tablet (0.5 mg total) by mouth 2 (two) times daily.  Dispense: 60 tablet; Refill: 2  7. BMI 33.0-33.9,adult Discussed diet and exercise for person with BMI >25 Will recheck weight in 3-6 months  8. Cough See if  D/C of lisinorpril will stop cough  9. Acquired hypothyroidism - levothyroxine (SYNTHROID) 75 MCG tablet; TAKE 1 TABLET DAILY BEFORE BREAKFAST DOSE CHANGE  Dispense: 90 tablet; Refill: 1 - TSH    Labs pending Health maintenance reviewed Diet and exercise encouraged Continue all meds Follow up  In 3 months   [place], FNP

## 2017-01-16 LAB — CMP14+EGFR
ALBUMIN: 4.8 g/dL (ref 3.6–4.8)
ALT: 22 IU/L (ref 0–32)
AST: 20 IU/L (ref 0–40)
Albumin/Globulin Ratio: 2 (ref 1.2–2.2)
Alkaline Phosphatase: 44 IU/L (ref 39–117)
BUN / CREAT RATIO: 15 (ref 12–28)
BUN: 17 mg/dL (ref 8–27)
Bilirubin Total: 0.2 mg/dL (ref 0.0–1.2)
CALCIUM: 9.8 mg/dL (ref 8.7–10.3)
CO2: 24 mmol/L (ref 20–29)
CREATININE: 1.16 mg/dL — AB (ref 0.57–1.00)
Chloride: 102 mmol/L (ref 96–106)
GFR calc non Af Amer: 51 mL/min/{1.73_m2} — ABNORMAL LOW (ref >59)
GFR, EST AFRICAN AMERICAN: 59 mL/min/{1.73_m2} — AB (ref >59)
GLUCOSE: 106 mg/dL — AB (ref 65–99)
Globulin, Total: 2.4 g/dL (ref 1.5–4.5)
Potassium: 4.2 mmol/L (ref 3.5–5.2)
Sodium: 139 mmol/L (ref 134–144)
TOTAL PROTEIN: 7.2 g/dL (ref 6.0–8.5)

## 2017-01-16 LAB — LIPID PANEL
CHOL/HDL RATIO: 5.6 ratio — AB (ref 0.0–4.4)
CHOLESTEROL TOTAL: 209 mg/dL — AB (ref 100–199)
HDL: 37 mg/dL — AB (ref >39)
LDL CALC: 124 mg/dL — AB (ref 0–99)
TRIGLYCERIDES: 238 mg/dL — AB (ref 0–149)
VLDL CHOLESTEROL CAL: 48 mg/dL — AB (ref 5–40)

## 2017-01-16 LAB — TSH: TSH: 2.31 u[IU]/mL (ref 0.450–4.500)

## 2017-04-07 ENCOUNTER — Ambulatory Visit (INDEPENDENT_AMBULATORY_CARE_PROVIDER_SITE_OTHER): Payer: 59

## 2017-04-07 DIAGNOSIS — Z23 Encounter for immunization: Secondary | ICD-10-CM

## 2017-04-19 ENCOUNTER — Ambulatory Visit (INDEPENDENT_AMBULATORY_CARE_PROVIDER_SITE_OTHER): Payer: 59 | Admitting: Nurse Practitioner

## 2017-04-19 ENCOUNTER — Encounter: Payer: Self-pay | Admitting: Nurse Practitioner

## 2017-04-19 VITALS — BP 134/82 | HR 81 | Temp 97.2°F | Ht 62.0 in | Wt 212.0 lb

## 2017-04-19 DIAGNOSIS — Z6833 Body mass index (BMI) 33.0-33.9, adult: Secondary | ICD-10-CM | POA: Diagnosis not present

## 2017-04-19 DIAGNOSIS — E785 Hyperlipidemia, unspecified: Secondary | ICD-10-CM

## 2017-04-19 DIAGNOSIS — F410 Panic disorder [episodic paroxysmal anxiety] without agoraphobia: Secondary | ICD-10-CM

## 2017-04-19 DIAGNOSIS — F3341 Major depressive disorder, recurrent, in partial remission: Secondary | ICD-10-CM

## 2017-04-19 DIAGNOSIS — F419 Anxiety disorder, unspecified: Secondary | ICD-10-CM

## 2017-04-19 DIAGNOSIS — I10 Essential (primary) hypertension: Secondary | ICD-10-CM

## 2017-04-19 DIAGNOSIS — E039 Hypothyroidism, unspecified: Secondary | ICD-10-CM

## 2017-04-19 DIAGNOSIS — F5101 Primary insomnia: Secondary | ICD-10-CM

## 2017-04-19 DIAGNOSIS — Z0000 Encounter for general adult medical examination without abnormal findings: Secondary | ICD-10-CM | POA: Diagnosis not present

## 2017-04-19 MED ORDER — ALPRAZOLAM 0.5 MG PO TABS: 0.5000 mg | ORAL_TABLET | Freq: Two times a day (BID) | ORAL | 2 refills | Status: DC

## 2017-04-19 MED ORDER — ZOLPIDEM TARTRATE 5 MG PO TABS: 5.0000 mg | ORAL_TABLET | Freq: Every evening | ORAL | 2 refills | Status: DC | PRN

## 2017-04-19 NOTE — Patient Instructions (Signed)
Bone Health Bones protect organs, store calcium, and anchor muscles. Good health habits, such as eating nutritious foods and exercising regularly, are important for maintaining healthy bones. They can also help to prevent a condition that causes bones to lose density and become weak and brittle (osteoporosis). Why is bone mass important? Bone mass refers to the amount of bone tissue that you have. The higher your bone mass, the stronger your bones. An important step toward having healthy bones throughout life is to have strong and dense bones during childhood. A young adult who has a high bone mass is more likely to have a high bone mass later in life. Bone mass at its greatest it is called peak bone mass. A large decline in bone mass occurs in older adults. In women, it occurs about the time of menopause. During this time, it is important to practice good health habits, because if more bone is lost than what is replaced, the bones will become less healthy and more likely to break (fracture). If you find that you have a low bone mass, you may be able to prevent osteoporosis or further bone loss by changing your diet and lifestyle. How can I find out if my bone mass is low? Bone mass can be measured with an X-ray test that is called a bone mineral density (BMD) test. This test is recommended for all women who are age 65 or older. It may also be recommended for men who are age 70 or older, or for people who are more likely to develop osteoporosis due to:  Having bones that break easily.  Having a long-term disease that weakens bones, such as kidney disease or rheumatoid arthritis.  Having menopause earlier than normal.  Taking medicine that weakens bones, such as steroids, thyroid hormones, or hormone treatment for breast cancer or prostate cancer.  Smoking.  Drinking three or more alcoholic drinks each day.  What are the nutritional recommendations for healthy bones? To have healthy bones, you  need to get enough of the right minerals and vitamins. Most nutrition experts recommend getting these nutrients from the foods that you eat. Nutritional recommendations vary from person to person. Ask your health care provider what is healthy for you. Here are some general guidelines. Calcium Recommendations Calcium is the most important (essential) mineral for bone health. Most people can get enough calcium from their diet, but supplements may be recommended for people who are at risk for osteoporosis. Good sources of calcium include:  Dairy products, such as low-fat or nonfat milk, cheese, and yogurt.  Dark green leafy vegetables, such as bok choy and broccoli.  Calcium-fortified foods, such as orange juice, cereal, bread, soy beverages, and tofu products.  Nuts, such as almonds.  Follow these recommended amounts for daily calcium intake:  Children, age 1?3: 700 mg.  Children, age 4?8: 1,000 mg.  Children, age 9?13: 1,300 mg.  Teens, age 14?18: 1,300 mg.  Adults, age 19?50: 1,000 mg.  Adults, age 51?70: ? Men: 1,000 mg. ? Women: 1,200 mg.  Adults, age 71 or older: 1,200 mg.  Pregnant and breastfeeding females: ? Teens: 1,300 mg. ? Adults: 1,000 mg.  Vitamin D Recommendations Vitamin D is the most essential vitamin for bone health. It helps the body to absorb calcium. Sunlight stimulates the skin to make vitamin D, so be sure to get enough sunlight. If you live in a cold climate or you do not get outside often, your health care provider may recommend that you take vitamin   D supplements. Good sources of vitamin D in your diet include:  Egg yolks.  Saltwater fish.  Milk and cereal fortified with vitamin D.  Follow these recommended amounts for daily vitamin D intake:  Children and teens, age 1?18: 600 international units.  Adults, age 50 or younger: 400-800 international units.  Adults, age 51 or older: 800-1,000 international units.  Other Nutrients Other nutrients  for bone health include:  Phosphorus. This mineral is found in meat, poultry, dairy foods, nuts, and legumes. The recommended daily intake for adult men and adult women is 700 mg.  Magnesium. This mineral is found in seeds, nuts, dark green vegetables, and legumes. The recommended daily intake for adult men is 400?420 mg. For adult women, it is 310?320 mg.  Vitamin K. This vitamin is found in green leafy vegetables. The recommended daily intake is 120 mg for adult men and 90 mg for adult women.  What type of physical activity is best for building and maintaining healthy bones? Weight-bearing and strength-building activities are important for building and maintaining peak bone mass. Weight-bearing activities cause muscles and bones to work against gravity. Strength-building activities increases muscle strength that supports bones. Weight-bearing and muscle-building activities include:  Walking and hiking.  Jogging and running.  Dancing.  Gym exercises.  Lifting weights.  Tennis and racquetball.  Climbing stairs.  Aerobics.  Adults should get at least 30 minutes of moderate physical activity on most days. Children should get at least 60 minutes of moderate physical activity on most days. Ask your health care provide what type of exercise is best for you. Where can I find more information? For more information, check out the following websites:  National Osteoporosis Foundation: http://nof.org/learn/basics  National Institutes of Health: http://www.niams.nih.gov/Health_Info/Bone/Bone_Health/bone_health_for_life.asp  This information is not intended to replace advice given to you by your health care provider. Make sure you discuss any questions you have with your health care provider. Document Released: 08/08/2003 Document Revised: 12/06/2015 Document Reviewed: 05/23/2014 Elsevier Interactive Patient Education  2018 [employer]vier Inc.  

## 2017-04-19 NOTE — Progress Notes (Addendum)
Subjective:    Patient ID: [name], female    DOB: 1956/01/12, 61 y.o.   MRN: [id number]  HPI   [name] is here today for annual physical and  follow up of chronic medical problem.  Outpatient Encounter Medications as of 04/19/2017  Medication Sig  . albuterol (PROVENTIL HFA;VENTOLIN HFA) 108 (90 Base) MCG/ACT inhaler Inhale 2 puffs into the lungs every 6 (six) hours as needed for wheezing or shortness of breath.  . ALPRAZolam (XANAX) 0.5 MG tablet Take 1 tablet (0.5 mg total) by mouth 2 (two) times daily.  Marland Kitchen co-enzyme Q-10 30 MG capsule Take 30 mg by mouth 3 (three) times daily.  . fenofibrate (TRICOR) 145 MG tablet Take 1 tablet (145 mg total) by mouth daily.  . Flaxseed, Linseed, (FLAXSEED OIL) 1000 MG CAPS Take 1 capsule by mouth daily.  Nyoka Cowden Tea 315 MG CAPS Take by mouth.  Marland Kitchen HAWTHORNE BERRY PO Take by mouth.  . levothyroxine (SYNTHROID) 75 MCG tablet TAKE 1 TABLET DAILY BEFORE BREAKFAST DOSE CHANGE  . losartan (COZAAR) 100 MG tablet Take 1 tablet (100 mg total) by mouth daily.  Marland Kitchen Lysine 500 MG CAPS Take 1 capsule by mouth daily.  . Milk Thistle 250 MG CAPS Take 1 capsule by mouth daily.  . Omega-3 Fatty Acids (FISH OIL) 1000 MG CAPS Take by mouth.  . SUPER B COMPLEX/C PO Take 1 capsule by mouth daily.  Marland Kitchen zolpidem (AMBIEN) 5 MG tablet Take 1-2 tablets (5-10 mg total) by mouth at bedtime as needed.  Marland Kitchen atorvastatin (LIPITOR) 40 MG tablet TAKE 1 TABLET DAILY  . guaiFENesin-codeine 100-10 MG/5ML syrup Take 5 mLs by mouth 3 (three) times daily as needed for cough.   No facility-administered encounter medications on file as of 04/19/2017.     1. Essential hypertension  No c/o chest pain, sob or headache. Does not check blood pressures at home. BP Readings from Last 3 Encounters:  04/19/17 134/82  01/15/17 (!) 141/95  07/31/16 139/69     2. Acquired hypothyroidism  No problems that sheis aware of  3. Anxiety  Stays anxious all the time. On xanax 0.5 bid- some  days only takes 1x a day  4. Recurrent major depressive disorder, in partial remission ([place])  Currently is not on antidepressant. She has been doing better since she has been out of work.  5. Panic attacks  Has rarely and is able to calm herself down. They usually occur in the  Morning.  6. Primary insomnia  She takes ambien nightly- Cannot sleep without meds  7. Hyperlipidemia with target LDL less than 100  Does not watch diet. She is no longer taking lipitor- causing "throat spasms".  8. BMI 33.0-33.9,adult  No recent weight changes    New complaints:  none today  Social history: Lives with husband who has significant mental problems. Her 6 great grandkids and their mom live with her. She has ben out of work since may of 2017 with anxiety problems. Qualified for [place].   Review of Systems  Constitutional: Negative for activity change and appetite change.  HENT: Negative.   Eyes: Negative for pain.  Respiratory: Negative for shortness of breath.   Cardiovascular: Negative for chest pain, palpitations and leg swelling.  Gastrointestinal: Negative for abdominal pain.  Endocrine: Negative for polydipsia.  Genitourinary: Negative.   Skin: Negative for rash.  Neurological: Negative for dizziness, weakness and headaches.  Hematological: Does not bruise/bleed easily.  Psychiatric/Behavioral: Negative.   All  other systems reviewed and are negative.      Objective:   Physical Exam  Constitutional: She is oriented to person, place, and time. She appears well-developed and well-nourished.  HENT:  Nose: Nose normal.  Mouth/Throat: Oropharynx is clear and moist.  Raspy voice  Eyes: EOM are normal.  Neck: Trachea normal, normal range of motion and full passive range of motion without pain. Neck supple. No JVD present. Carotid bruit is not present. No thyromegaly present.  Cardiovascular: Normal rate, regular rhythm, normal heart sounds and intact distal pulses. Exam reveals no  gallop and no friction rub.  No murmur heard. Pulmonary/Chest: Effort normal and breath sounds normal.  Abdominal: Soft. Bowel sounds are normal. She exhibits no distension and no mass. There is no tenderness.  Musculoskeletal: Normal range of motion.  Lymphadenopathy:    She has no cervical adenopathy.  Neurological: She is alert and oriented to person, place, and time. She has normal reflexes.  Skin: Skin is warm and dry.  Psychiatric: She has a normal mood and affect. Her behavior is normal. Judgment and thought content normal.   BP 134/82   Pulse 81   Temp (!) 97.2 F (36.2 C) (Oral)   Ht 5\' 2"  (1.575 m)   Wt 212 lb (96.2 kg)   BMI 38.78 kg/m         Assessment & Plan:   Annual physical  1. Essential hypertension Low sodium diet  2. Acquired hypothyroidism  3. Anxiety Stress management - ALPRAZolam (XANAX) 0.5 MG tablet; Take 1 tablet (0.5 mg total) 2 (two) times daily by mouth.  Dispense: 60 tablet; Refill: 2  4. Recurrent major depressive disorder, in partial remission ([place])  5. Panic attacks  6. Primary insomnia Bedtime routine - zolpidem (AMBIEN) 5 MG tablet; Take 1-2 tablets (5-10 mg total) at bedtime as needed by mouth.  Dispense: 60 tablet; Refill: 2  7. Hyperlipidemia with target LDL less than 100 Low fat diet  8. BMI 33.0-33.9,adult Discussed diet and exercise for person with BMI >25 Will recheck weight in 3-6 months    Labs pending Health maintenance reviewed Diet and exercise encouraged Continue all meds Follow up  In 6 months [place], FNP

## 2017-04-19 NOTE — Addendum Note (Signed)
Addended by: Chevis Pretty on: 04/19/2017 11:50 AM   Modules accepted: Orders

## 2017-04-20 LAB — CBC WITH DIFFERENTIAL/PLATELET
Basophils Absolute: 0 10*3/uL (ref 0.0–0.2)
Basos: 0 %
EOS (ABSOLUTE): 0.2 10*3/uL (ref 0.0–0.4)
EOS: 3 %
HEMATOCRIT: 36.5 % (ref 34.0–46.6)
HEMOGLOBIN: 12.4 g/dL (ref 11.1–15.9)
IMMATURE GRANS (ABS): 0 10*3/uL (ref 0.0–0.1)
Immature Granulocytes: 0 %
LYMPHS: 15 %
Lymphocytes Absolute: 1 10*3/uL (ref 0.7–3.1)
MCH: 29.6 pg (ref 26.6–33.0)
MCHC: 34 g/dL (ref 31.5–35.7)
MCV: 87 fL (ref 79–97)
MONOCYTES: 8 %
Monocytes Absolute: 0.5 10*3/uL (ref 0.1–0.9)
NEUTROS ABS: 4.9 10*3/uL (ref 1.4–7.0)
Neutrophils: 74 %
Platelets: 233 10*3/uL (ref 150–379)
RBC: 4.19 x10E6/uL (ref 3.77–5.28)
RDW: 14.2 % (ref 12.3–15.4)
WBC: 6.7 10*3/uL (ref 3.4–10.8)

## 2017-04-20 LAB — CMP14+EGFR
ALT: 28 IU/L (ref 0–32)
AST: 17 IU/L (ref 0–40)
Albumin/Globulin Ratio: 2.2 (ref 1.2–2.2)
Albumin: 4.7 g/dL (ref 3.6–4.8)
Alkaline Phosphatase: 50 IU/L (ref 39–117)
BUN/Creatinine Ratio: 15 (ref 12–28)
BUN: 17 mg/dL (ref 8–27)
Bilirubin Total: 0.2 mg/dL (ref 0.0–1.2)
CO2: 22 mmol/L (ref 20–29)
Calcium: 10.3 mg/dL (ref 8.7–10.3)
Chloride: 103 mmol/L (ref 96–106)
Creatinine, Ser: 1.12 mg/dL — ABNORMAL HIGH (ref 0.57–1.00)
GFR calc Af Amer: 61 mL/min/1.73
GFR calc non Af Amer: 53 mL/min/1.73 — ABNORMAL LOW
Globulin, Total: 2.1 g/dL (ref 1.5–4.5)
Glucose: 124 mg/dL — ABNORMAL HIGH (ref 65–99)
Potassium: 4.3 mmol/L (ref 3.5–5.2)
Sodium: 141 mmol/L (ref 134–144)
Total Protein: 6.8 g/dL (ref 6.0–8.5)

## 2017-04-20 LAB — LIPID PANEL
CHOL/HDL RATIO: 6.5 ratio — AB (ref 0.0–4.4)
Cholesterol, Total: 220 mg/dL — ABNORMAL HIGH (ref 100–199)
HDL: 34 mg/dL — AB (ref >39)
LDL CALC: 141 mg/dL — AB (ref 0–99)
Triglycerides: 224 mg/dL — ABNORMAL HIGH (ref 0–149)
VLDL CHOLESTEROL CAL: 45 mg/dL — AB (ref 5–40)

## 2017-04-20 LAB — THYROID PANEL WITH TSH
Free Thyroxine Index: 1.6 (ref 1.2–4.9)
T3 Uptake Ratio: 27 % (ref 24–39)
T4, Total: 5.9 ug/dL (ref 4.5–12.0)
TSH: 4.57 u[IU]/mL — ABNORMAL HIGH (ref 0.450–4.500)

## 2017-06-10 ENCOUNTER — Other Ambulatory Visit: Payer: Self-pay | Admitting: Nurse Practitioner

## 2017-06-10 DIAGNOSIS — I10 Essential (primary) hypertension: Secondary | ICD-10-CM

## 2017-07-12 ENCOUNTER — Telehealth: Payer: Self-pay | Admitting: Nurse Practitioner

## 2017-07-12 DIAGNOSIS — F419 Anxiety disorder, unspecified: Secondary | ICD-10-CM

## 2017-07-12 MED ORDER — ALPRAZOLAM 0.5 MG PO TABS: 0.5000 mg | ORAL_TABLET | Freq: Two times a day (BID) | ORAL | 0 refills | Status: DC

## 2017-07-12 NOTE — Telephone Encounter (Signed)
Please review and advise.

## 2017-08-11 ENCOUNTER — Other Ambulatory Visit: Payer: Self-pay | Admitting: Nurse Practitioner

## 2017-08-11 DIAGNOSIS — F419 Anxiety disorder, unspecified: Secondary | ICD-10-CM

## 2017-09-09 ENCOUNTER — Other Ambulatory Visit: Payer: Self-pay | Admitting: Nurse Practitioner

## 2017-09-09 DIAGNOSIS — F419 Anxiety disorder, unspecified: Secondary | ICD-10-CM

## 2017-09-10 NOTE — Telephone Encounter (Signed)
Last seen 04/19/17  MMM

## 2017-09-12 ENCOUNTER — Other Ambulatory Visit: Payer: Self-pay | Admitting: Nurse Practitioner

## 2017-09-12 DIAGNOSIS — F419 Anxiety disorder, unspecified: Secondary | ICD-10-CM

## 2017-10-07 ENCOUNTER — Ambulatory Visit (INDEPENDENT_AMBULATORY_CARE_PROVIDER_SITE_OTHER): Payer: 59 | Admitting: Nurse Practitioner

## 2017-10-07 ENCOUNTER — Encounter: Payer: Self-pay | Admitting: Nurse Practitioner

## 2017-10-07 VITALS — BP 135/79 | HR 86 | Temp 97.1°F | Ht 62.0 in | Wt 212.0 lb

## 2017-10-07 DIAGNOSIS — I10 Essential (primary) hypertension: Secondary | ICD-10-CM | POA: Diagnosis not present

## 2017-10-07 DIAGNOSIS — Z6833 Body mass index (BMI) 33.0-33.9, adult: Secondary | ICD-10-CM

## 2017-10-07 DIAGNOSIS — E785 Hyperlipidemia, unspecified: Secondary | ICD-10-CM

## 2017-10-07 DIAGNOSIS — F410 Panic disorder [episodic paroxysmal anxiety] without agoraphobia: Secondary | ICD-10-CM | POA: Diagnosis not present

## 2017-10-07 DIAGNOSIS — F419 Anxiety disorder, unspecified: Secondary | ICD-10-CM

## 2017-10-07 DIAGNOSIS — E039 Hypothyroidism, unspecified: Secondary | ICD-10-CM | POA: Diagnosis not present

## 2017-10-07 DIAGNOSIS — F5101 Primary insomnia: Secondary | ICD-10-CM | POA: Diagnosis not present

## 2017-10-07 DIAGNOSIS — F3341 Major depressive disorder, recurrent, in partial remission: Secondary | ICD-10-CM

## 2017-10-07 MED ORDER — FENOFIBRATE 145 MG PO TABS: 145.0000 mg | ORAL_TABLET | Freq: Every day | ORAL | 1 refills | Status: DC

## 2017-10-07 MED ORDER — LOSARTAN POTASSIUM 100 MG PO TABS: 100.0000 mg | ORAL_TABLET | Freq: Every day | ORAL | 1 refills | Status: DC

## 2017-10-07 MED ORDER — ALPRAZOLAM 0.5 MG PO TABS: 0.5000 mg | ORAL_TABLET | Freq: Two times a day (BID) | ORAL | 2 refills | Status: DC

## 2017-10-07 MED ORDER — ZOLPIDEM TARTRATE 5 MG PO TABS: 5.0000 mg | ORAL_TABLET | Freq: Every evening | ORAL | 2 refills | Status: DC | PRN

## 2017-10-07 MED ORDER — LEVOTHYROXINE SODIUM 75 MCG PO TABS
ORAL_TABLET | ORAL | 1 refills | Status: DC
Start: 2017-10-07 — End: 2018-01-18

## 2017-10-07 NOTE — Progress Notes (Signed)
Subjective:    Patient ID: [name], female    DOB: 02-Sep-1955, 62 y.o.   MRN: [id number]   Chief Complaint: Medical Management of chronic issues  HPI:  1. Essential hypertension  No c/o chest pain, sob or headache. Does not check blood pressures at home. BP Readings from Last 3 Encounters:  04/19/17 134/82  01/15/17 (!) 141/95  07/31/16 139/69     2. Acquired hypothyroidism  No problems that she is aware  3. Panic attacks  Still on xanax- she has to take daily- panic attacks are worse in mornings  4. Primary insomnia  Takes ambien to sleep at night. Is not able to sleep without it.  5. Hyperlipidemia with target LDL less than 100  Does not watchdiet  6. Recurrent major depressive disorder, in partial remission ([place])  Is not on antidepressant right now. She says she is doing okay. She just does not want to take any medication. Depression screen [facility] 2/9 10/07/2017 04/19/2017 01/15/2017  Decreased Interest _0 Down, Depressed, Hopeless _1 PHQ - 2 Score _2 Altered sleeping _3 Tired, decreased energy _4 Change in appetite _5 Feeling bad or failure about yourself  0 0 0  Trouble concentrating _6 Moving slowly or fidgety/restless 0 0 0  Suicidal thoughts 0 0 0  PHQ-9 Score _7 Some recent data might be hidden     7. BMI 33.0-33.9,adult  No recent weight changes  8. Anxiety  stays anxious all the time    Outpatient Encounter Medications as of 10/07/2017  Medication Sig  . albuterol (PROVENTIL HFA;VENTOLIN HFA) 108 (90 Base) MCG/ACT inhaler Inhale 2 puffs into the lungs every 6 (six) hours as needed for wheezing or shortness of breath.  . ALPRAZolam (XANAX) 0.5 MG tablet TAKE 1 TABLET (0.5 MG TOTAL) BY MOUTH 2 (TWO) TIMES DAILY.  Marland Kitchen atorvastatin (LIPITOR) 40 MG tablet TAKE 1 TABLET DAILY  . co-enzyme Q-10 30 MG capsule Take 30 mg by mouth 3 (three) times daily.  . fenofibrate (TRICOR) 145 MG tablet Take 1 tablet (145 mg total) by mouth  daily.  . Flaxseed, Linseed, (FLAXSEED OIL) 1000 MG CAPS Take 1 capsule by mouth daily.  Nyoka Cowden Tea 315 MG CAPS Take by mouth.  Marland Kitchen guaiFENesin-codeine 100-10 MG/5ML syrup Take 5 mLs by mouth 3 (three) times daily as needed for cough.  Marland Kitchen HAWTHORNE BERRY PO Take by mouth.  . levothyroxine (SYNTHROID) 75 MCG tablet TAKE 1 TABLET DAILY BEFORE BREAKFAST DOSE CHANGE  . losartan (COZAAR) 100 MG tablet TAKE 1 TABLET DAILY  . Lysine 500 MG CAPS Take 1 capsule by mouth daily.  . Milk Thistle 250 MG CAPS Take 1 capsule by mouth daily.  . Omega-3 Fatty Acids (FISH OIL) 1000 MG CAPS Take by mouth.  . SUPER B COMPLEX/C PO Take 1 capsule by mouth daily.  Marland Kitchen zolpidem (AMBIEN) 5 MG tablet Take 1-2 tablets (5-10 mg total) at bedtime as needed by mouth.      New complaints: None today  Social history: husaband has some mental issues. He is not doing well because his brother just died and now he will not hardly get out of bed and do anything    Review of Systems  Constitutional: Negative for activity change and appetite change.  HENT: Negative.   Eyes: Negative for pain.  Respiratory: Negative for shortness of breath.  Cardiovascular: Negative for chest pain, palpitations and leg swelling.  Gastrointestinal: Negative for abdominal pain.  Endocrine: Negative for polydipsia.  Genitourinary: Negative.   Skin: Negative for rash.  Neurological: Negative for dizziness, weakness and headaches.  Hematological: Does not bruise/bleed easily.  Psychiatric/Behavioral: Positive for sleep disturbance. Negative for suicidal ideas. The patient is nervous/anxious.   All other systems reviewed and are negative.      Objective:   Physical Exam  Constitutional: She is oriented to person, place, and time.  HENT:  Head: Normocephalic.  Nose: Nose normal.  Mouth/Throat: Oropharynx is clear and moist.  Eyes: Pupils are equal, round, and reactive to light. EOM are normal.  Neck: Normal range of motion. Neck  supple. No JVD present. Carotid bruit is not present.  Cardiovascular: Normal rate, regular rhythm, normal heart sounds and intact distal pulses.  Pulmonary/Chest: Effort normal and breath sounds normal. No respiratory distress. She has no wheezes. She has no rales. She exhibits no tenderness.  Abdominal: Soft. Normal appearance, normal aorta and bowel sounds are normal. She exhibits no distension, no abdominal bruit, no pulsatile midline mass and no mass. There is no splenomegaly or hepatomegaly. There is no tenderness.  Musculoskeletal: Normal range of motion. She exhibits no edema.  Lymphadenopathy:    She has no cervical adenopathy.  Neurological: She is alert and oriented to person, place, and time. She has normal reflexes.  Skin: Skin is warm and dry.  Psychiatric: Her speech is normal. Judgment and thought content normal. Her mood appears anxious. She is hyperactive. Cognition and memory are normal.   BP 135/79   Pulse 86   Temp (!) 97.1 F (36.2 C) (Oral)   Ht _0  (1.575 m)   Wt 212 lb (96.2 kg)   BMI 38.78 kg/m         Assessment & Plan:  [name] comes in today with chief complaint of Medical Management of Chronic Issues   Diagnosis and orders addressed:  1. Essential hypertension Low sodium diet - losartan (COZAAR) 100 MG tablet; Take 1 tablet (100 mg total) by mouth daily.  Dispense: 90 tablet; Refill: 1 - CMP14+EGFR  2. Acquired hypothyroidism - levothyroxine (SYNTHROID) 75 MCG tablet; TAKE 1 TABLET DAILY BEFORE BREAKFAST DOSE CHANGE  Dispense: 90 tablet; Refill: 1 - Thyroid Panel With TSH  3. Panic attacks Stress management  4. Primary insomnia Bedtime routine - zolpidem (AMBIEN) 5 MG tablet; Take 1-2 tablets (5-10 mg total) by mouth at bedtime as needed.  Dispense: 60 tablet; Refill: 2  5. Hyperlipidemia with target LDL less than 100 Low fat diet - fenofibrate (TRICOR) 145 MG tablet; Take 1 tablet (145 mg total) by mouth daily.  Dispense: 90  tablet; Refill: 1 - Lipid panel  6. Recurrent major depressive disorder, in partial remission [facility]) Patient refuses medication and counselin  7. BMI 33.0-33.9,adult Discussed diet and exercise for person with BMI >25 Will recheck weight in 3-6 months  8. Anxiety Stress management - ALPRAZolam (XANAX) 0.5 MG tablet; Take 1 tablet (0.5 mg total) by mouth 2 (two) times daily.  Dispense: 60 tablet; Refill: 2   Labs pending Health Maintenance reviewed Diet and exercise encouraged  Follow up plan: 3 months   [name] Done, FNP

## 2017-10-07 NOTE — Patient Instructions (Signed)

## 2017-10-08 LAB — CMP14+EGFR
ALT: 37 IU/L — ABNORMAL HIGH (ref 0–32)
AST: 26 IU/L (ref 0–40)
Albumin/Globulin Ratio: 2 (ref 1.2–2.2)
Albumin: 4.7 g/dL (ref 3.6–4.8)
Alkaline Phosphatase: 48 IU/L (ref 39–117)
BILIRUBIN TOTAL: 0.2 mg/dL (ref 0.0–1.2)
BUN / CREAT RATIO: 15 (ref 12–28)
BUN: 16 mg/dL (ref 8–27)
CALCIUM: 9.5 mg/dL (ref 8.7–10.3)
CHLORIDE: 105 mmol/L (ref 96–106)
CO2: 19 mmol/L — ABNORMAL LOW (ref 20–29)
Creatinine, Ser: 1.07 mg/dL — ABNORMAL HIGH (ref 0.57–1.00)
GFR, EST AFRICAN AMERICAN: 64 mL/min/{1.73_m2} (ref >59)
GFR, EST NON AFRICAN AMERICAN: 56 mL/min/{1.73_m2} — AB (ref >59)
Globulin, Total: 2.4 g/dL (ref 1.5–4.5)
Glucose: 114 mg/dL — ABNORMAL HIGH (ref 65–99)
POTASSIUM: 4.3 mmol/L (ref 3.5–5.2)
SODIUM: 141 mmol/L (ref 134–144)
TOTAL PROTEIN: 7.1 g/dL (ref 6.0–8.5)

## 2017-10-08 LAB — THYROID PANEL WITH TSH
FREE THYROXINE INDEX: 1.7 (ref 1.2–4.9)
T3 Uptake Ratio: 28 % (ref 24–39)
T4 TOTAL: 5.9 ug/dL (ref 4.5–12.0)
TSH: 3.08 u[IU]/mL (ref 0.450–4.500)

## 2017-10-08 LAB — LIPID PANEL
CHOL/HDL RATIO: 5.9 ratio — AB (ref 0.0–4.4)
Cholesterol, Total: 205 mg/dL — ABNORMAL HIGH (ref 100–199)
HDL: 35 mg/dL — ABNORMAL LOW (ref >39)
LDL Calculated: 122 mg/dL — ABNORMAL HIGH (ref 0–99)
Triglycerides: 238 mg/dL — ABNORMAL HIGH (ref 0–149)
VLDL Cholesterol Cal: 48 mg/dL — ABNORMAL HIGH (ref 5–40)

## 2017-10-13 ENCOUNTER — Encounter: Payer: Self-pay | Admitting: *Deleted

## 2017-10-18 ENCOUNTER — Encounter: Payer: Self-pay | Admitting: Nurse Practitioner

## 2018-01-18 ENCOUNTER — Encounter: Payer: Self-pay | Admitting: Nurse Practitioner

## 2018-01-18 ENCOUNTER — Ambulatory Visit (INDEPENDENT_AMBULATORY_CARE_PROVIDER_SITE_OTHER): Payer: 59 | Admitting: Nurse Practitioner

## 2018-01-18 VITALS — BP 131/77 | HR 94 | Temp 98.1°F | Ht 62.0 in | Wt 214.0 lb

## 2018-01-18 DIAGNOSIS — F410 Panic disorder [episodic paroxysmal anxiety] without agoraphobia: Secondary | ICD-10-CM

## 2018-01-18 DIAGNOSIS — Z6833 Body mass index (BMI) 33.0-33.9, adult: Secondary | ICD-10-CM

## 2018-01-18 DIAGNOSIS — F3341 Major depressive disorder, recurrent, in partial remission: Secondary | ICD-10-CM

## 2018-01-18 DIAGNOSIS — I10 Essential (primary) hypertension: Secondary | ICD-10-CM | POA: Diagnosis not present

## 2018-01-18 DIAGNOSIS — F419 Anxiety disorder, unspecified: Secondary | ICD-10-CM

## 2018-01-18 DIAGNOSIS — E039 Hypothyroidism, unspecified: Secondary | ICD-10-CM | POA: Diagnosis not present

## 2018-01-18 DIAGNOSIS — F5101 Primary insomnia: Secondary | ICD-10-CM | POA: Diagnosis not present

## 2018-01-18 DIAGNOSIS — E785 Hyperlipidemia, unspecified: Secondary | ICD-10-CM

## 2018-01-18 DIAGNOSIS — L989 Disorder of the skin and subcutaneous tissue, unspecified: Secondary | ICD-10-CM

## 2018-01-18 MED ORDER — ALPRAZOLAM 0.5 MG PO TABS: 0.5000 mg | ORAL_TABLET | Freq: Two times a day (BID) | ORAL | 3 refills | Status: DC

## 2018-01-18 MED ORDER — LEVOTHYROXINE SODIUM 75 MCG PO TABS: ORAL_TABLET | ORAL | 1 refills | Status: DC

## 2018-01-18 MED ORDER — FENOFIBRATE 145 MG PO TABS: 145.0000 mg | ORAL_TABLET | Freq: Every day | ORAL | 1 refills | Status: DC

## 2018-01-18 MED ORDER — LOSARTAN POTASSIUM 100 MG PO TABS: 100.0000 mg | ORAL_TABLET | Freq: Every day | ORAL | 1 refills | Status: DC

## 2018-01-18 MED ORDER — ZOLPIDEM TARTRATE 5 MG PO TABS: 5.0000 mg | ORAL_TABLET | Freq: Every evening | ORAL | 3 refills | Status: DC | PRN

## 2018-01-18 NOTE — Progress Notes (Signed)
Subjective:    Patient ID: [name], female    DOB: 03-Mar-1956, 62 y.o.   MRN: [id number]   Chief Complaint: Medical Management of Chronic issues  HPI:  1. Essential hypertension  No c/o chest pain, sob or headache. Does not check blood pressure at home. BP Readings from Last 3 Encounters:  01/18/18 131/77  10/07/17 135/79  04/19/17 134/82     2. Acquired hypothyroidism  No problems that she is aware of  3. Panic attacks  Has frequently and take xanax BID  4. Primary insomnia  Takes ambien to sleep at night  5. Hyperlipidemia with target LDL less than 100  does not watch diet and does very little exercise.  6. Recurrent major depressive disorder, in partial remission ([place])  Stays depressed. Refuses antidepressant. Depression screen [facility] 2/9 01/18/2018 10/07/2017 04/19/2017  Decreased Interest 1 1 2   Down, Depressed, Hopeless 1 2 3   PHQ - 2 Score 2 3 5   Altered sleeping 3 3 3   Tired, decreased energy 1 2 2   Change in appetite 1 2 3   Feeling bad or failure about yourself  0 0 0  Trouble concentrating 0 1 2  Moving slowly or fidgety/restless 0 0 0  Suicidal thoughts 0 0 0  PHQ-9 Score 7 11 15   Some recent data might be hidden     7. BMI 33.0-33.9,adult  No recent weight changes  8. Anxiety  Stays anxious- takes xanax BID which helps.    Outpatient Encounter Medications as of 01/18/2018  Medication Sig  . albuterol (PROVENTIL HFA;VENTOLIN HFA) 108 (90 Base) MCG/ACT inhaler Inhale 2 puffs into the lungs every 6 (six) hours as needed for wheezing or shortness of breath.  . ALPRAZolam (XANAX) 0.5 MG tablet Take 1 tablet (0.5 mg total) by mouth 2 (two) times daily.  Marland Kitchen co-enzyme Q-10 30 MG capsule Take 30 mg by mouth 3 (three) times daily.  . fenofibrate (TRICOR) 145 MG tablet Take 1 tablet (145 mg total) by mouth daily.  . Flaxseed, Linseed, (FLAXSEED OIL) 1000 MG CAPS Take 1 capsule by mouth daily.  Nyoka Cowden Tea 315 MG CAPS Take by mouth.  Marland Kitchen HAWTHORNE BERRY PO Take  by mouth.  . levothyroxine (SYNTHROID) 75 MCG tablet TAKE 1 TABLET DAILY BEFORE BREAKFAST DOSE CHANGE  . losartan (COZAAR) 100 MG tablet Take 1 tablet (100 mg total) by mouth daily.  Marland Kitchen Lysine 500 MG CAPS Take 1 capsule by mouth daily.  . Milk Thistle 250 MG CAPS Take 1 capsule by mouth daily.  . Omega-3 Fatty Acids (FISH OIL) 1000 MG CAPS Take by mouth.  . SUPER B COMPLEX/C PO Take 1 capsule by mouth daily.  Marland Kitchen zolpidem (AMBIEN) 5 MG tablet Take 1-2 tablets (5-10 mg total) by mouth at bedtime as needed.      New complaints: None today  Social history: lives with her husband who has severe depression. Has grandchildren living with her. She is on disability for her anxiety   Review of Systems  Constitutional: Negative for activity change and appetite change.  HENT: Negative.   Eyes: Negative for pain.  Respiratory: Negative for shortness of breath.   Cardiovascular: Negative for chest pain, palpitations and leg swelling.  Gastrointestinal: Negative for abdominal pain.  Endocrine: Negative for polydipsia.  Genitourinary: Negative.   Musculoskeletal: Negative.   Skin: Negative for rash.  Neurological: Negative for dizziness, weakness and headaches.  Hematological: Does not bruise/bleed easily.  Psychiatric/Behavioral: Negative.   All other systems reviewed and  are negative.      Objective:   Physical Exam  Constitutional: She is oriented to person, place, and time. She appears well-developed and well-nourished. No distress.  HENT:  Head: Normocephalic.  Nose: Nose normal.  Mouth/Throat: Oropharynx is clear and moist.  Eyes: Pupils are equal, round, and reactive to light. EOM are normal.  Neck: Normal range of motion. Neck supple. No JVD present. Carotid bruit is not present.  Cardiovascular: Normal rate, regular rhythm, normal heart sounds and intact distal pulses.  Pulmonary/Chest: Effort normal and breath sounds normal. No respiratory distress. She has no wheezes. She has  no rales. She exhibits no tenderness.  Abdominal: Soft. Normal appearance, normal aorta and bowel sounds are normal. She exhibits no distension, no abdominal bruit, no pulsatile midline mass and no mass. There is no splenomegaly or hepatomegaly. There is no tenderness.  Musculoskeletal: Normal range of motion. She exhibits no edema.  Lymphadenopathy:    She has no cervical adenopathy.  Neurological: She is alert and oriented to person, place, and time. She has normal reflexes.  Skin: Skin is warm and dry.  1cm flesh colored lesion on left nostril  Psychiatric: She has a normal mood and affect. Her behavior is normal. Judgment and thought content normal.    BP 131/77   Pulse 94   Temp 98.1 F (36.7 C) (Oral)   Ht 5' 2"  (1.575 m)   Wt 214 lb (97.1 kg)   BMI 39.14 kg/m        Assessment & Plan:  ADREENA [name] comes in today with chief complaint of Hypertension; Hypothyroidism; Anxiety; and Insomnia   Diagnosis and orders addressed:  1. Essential hypertension Low sodium diet - losartan (COZAAR) 100 MG tablet; Take 1 tablet (100 mg total) by mouth daily.  Dispense: 90 tablet; Refill: 1 - CMP14+EGFR  2. Acquired hypothyroidism - levothyroxine (SYNTHROID) 75 MCG tablet; TAKE 1 TABLET DAILY BEFORE BREAKFAST DOSE CHANGE  Dispense: 90 tablet; Refill: 1 - Thyroid Panel With TSH  3. Panic attacks Stress management  4. Primary insomnia Bedtime routine - zolpidem (AMBIEN) 5 MG tablet; Take 1-2 tablets (5-10 mg total) by mouth at bedtime as needed.  Dispense: 60 tablet; Refill: 3  5. Hyperlipidemia with target LDL less than 100 Low fat diet - fenofibrate (TRICOR) 145 MG tablet; Take 1 tablet (145 mg total) by mouth daily.  Dispense: 90 tablet; Refill: 1 - Lipid panel  6. Recurrent major depressive disorder, in partial remission ([place]) Again enouraged stress management  7. BMI 33.0-33.9,adult Discussed diet and exercise for person with BMI >25 Will recheck weight in 3-6  months  8. Anxiety - ALPRAZolam (XANAX) 0.5 MG tablet; Take 1 tablet (0.5 mg total) by mouth 2 (two) times daily.  Dispense: 60 tablet; Refill: 3  9. External nasal lesion Do not pick or scratch at area - Ambulatory referral to Dermatology   Labs pending Health Maintenance reviewed Diet and exercise encouraged  Follow up plan: 6 months   [place], FNP

## 2018-01-18 NOTE — Patient Instructions (Signed)
Stress and Stress Management Stress is a normal reaction to life events. It is what you feel when life demands more than you are used to or more than you can handle. Some stress can be useful. For example, the stress reaction can help you catch the last bus of the day, study for a test, or meet a deadline at work. But stress that occurs too often or for too long can cause problems. It can affect your emotional health and interfere with relationships and normal daily activities. Too much stress can weaken your immune system and increase your risk for physical illness. If you already have a medical problem, stress can make it worse. What are the causes? All sorts of life events may cause stress. An event that causes stress for one person may not be stressful for another person. Major life events commonly cause stress. These may be positive or negative. Examples include losing your job, moving into a new home, getting married, having a baby, or losing a loved one. Less obvious life events may also cause stress, especially if they occur day after day or in combination. Examples include working long hours, driving in traffic, caring for children, being in debt, or being in a difficult relationship. What are the signs or symptoms? Stress may cause emotional symptoms including, the following:  Anxiety. This is feeling worried, afraid, on edge, overwhelmed, or out of control.  Anger. This is feeling irritated or impatient.  Depression. This is feeling sad, down, helpless, or guilty.  Difficulty focusing, remembering, or making decisions.  Stress may cause physical symptoms, including the following:  Aches and pains. These may affect your head, neck, back, stomach, or other areas of your body.  Tight muscles or clenched jaw.  Low energy or trouble sleeping.  Stress may cause unhealthy behaviors, including the following:  Eating to feel better (overeating) or skipping meals.  Sleeping too little,  too much, or both.  Working too much or putting off tasks (procrastination).  Smoking, drinking alcohol, or using drugs to feel better.  How is this diagnosed? Stress is diagnosed through an assessment by your health care provider. Your health care provider will ask questions about your symptoms and any stressful life events.Your health care provider will also ask about your medical history and may order blood tests or other tests. Certain medical conditions and medicine can cause physical symptoms similar to stress. Mental illness can cause emotional symptoms and unhealthy behaviors similar to stress. Your health care provider may refer you to a mental health professional for further evaluation. How is this treated? Stress management is the recommended treatment for stress.The goals of stress management are reducing stressful life events and coping with stress in healthy ways. Techniques for reducing stressful life events include the following:  Stress identification. Self-monitor for stress and identify what causes stress for you. These skills may help you to avoid some stressful events.  Time management. Set your priorities, keep a calendar of events, and learn to say "no." These tools can help you avoid making too many commitments.  Techniques for coping with stress include the following:  Rethinking the problem. Try to think realistically about stressful events rather than ignoring them or overreacting. Try to find the positives in a stressful situation rather than focusing on the negatives.  Exercise. Physical exercise can release both physical and emotional tension. The key is to find a form of exercise you enjoy and do it regularly.  Relaxation techniques. These relax the body and  mind. Examples include yoga, meditation, tai chi, biofeedback, deep breathing, progressive muscle relaxation, listening to music, being out in nature, journaling, and other hobbies. Again, the key is to find  one or more that you enjoy and can do regularly.  Healthy lifestyle. Eat a balanced diet, get plenty of sleep, and do not smoke. Avoid using alcohol or drugs to relax.  Strong support network. Spend time with family, friends, or other people you enjoy being around.Express your feelings and talk things over with someone you trust.  Counseling or talktherapy with a mental health professional may be helpful if you are having difficulty managing stress on your own. Medicine is typically not recommended for the treatment of stress.Talk to your health care provider if you think you need medicine for symptoms of stress. Follow these instructions at home:  Keep all follow-up visits as directed by your health care provider.  Take all medicines as directed by your health care provider. Contact a health care provider if:  Your symptoms get worse or you start having new symptoms.  You feel overwhelmed by your problems and can no longer manage them on your own. Get help right away if:  You feel like hurting yourself or someone else. This information is not intended to replace advice given to you by your health care provider. Make sure you discuss any questions you have with your health care provider. Document Released: 11/11/2000 Document Revised: 10/24/2015 Document Reviewed: 01/10/2013 Elsevier Interactive Patient Education  2017 [employer]  

## 2018-01-19 LAB — CMP14+EGFR
ALK PHOS: 49 IU/L (ref 39–117)
ALT: 35 IU/L — AB (ref 0–32)
AST: 20 IU/L (ref 0–40)
Albumin/Globulin Ratio: 1.8 (ref 1.2–2.2)
Albumin: 4.7 g/dL (ref 3.6–4.8)
BILIRUBIN TOTAL: 0.3 mg/dL (ref 0.0–1.2)
BUN/Creatinine Ratio: 16 (ref 12–28)
BUN: 19 mg/dL (ref 8–27)
CHLORIDE: 103 mmol/L (ref 96–106)
CO2: 21 mmol/L (ref 20–29)
Calcium: 10 mg/dL (ref 8.7–10.3)
Creatinine, Ser: 1.2 mg/dL — ABNORMAL HIGH (ref 0.57–1.00)
GFR calc Af Amer: 56 mL/min/{1.73_m2} — ABNORMAL LOW (ref >59)
GFR calc non Af Amer: 49 mL/min/{1.73_m2} — ABNORMAL LOW (ref >59)
GLUCOSE: 159 mg/dL — AB (ref 65–99)
Globulin, Total: 2.6 g/dL (ref 1.5–4.5)
Potassium: 4.5 mmol/L (ref 3.5–5.2)
Sodium: 139 mmol/L (ref 134–144)
Total Protein: 7.3 g/dL (ref 6.0–8.5)

## 2018-01-19 LAB — LIPID PANEL
CHOLESTEROL TOTAL: 218 mg/dL — AB (ref 100–199)
Chol/HDL Ratio: 6.2 ratio — ABNORMAL HIGH (ref 0.0–4.4)
HDL: 35 mg/dL — AB (ref >39)
LDL Calculated: 126 mg/dL — ABNORMAL HIGH (ref 0–99)
Triglycerides: 285 mg/dL — ABNORMAL HIGH (ref 0–149)
VLDL CHOLESTEROL CAL: 57 mg/dL — AB (ref 5–40)

## 2018-01-19 LAB — THYROID PANEL WITH TSH
Free Thyroxine Index: 1.7 (ref 1.2–4.9)
T3 Uptake Ratio: 28 % (ref 24–39)
T4, Total: 6.2 ug/dL (ref 4.5–12.0)
TSH: 3.14 u[IU]/mL (ref 0.450–4.500)

## 2018-03-14 ENCOUNTER — Ambulatory Visit (INDEPENDENT_AMBULATORY_CARE_PROVIDER_SITE_OTHER): Payer: Managed Care, Other (non HMO) | Admitting: Obstetrics & Gynecology

## 2018-03-14 ENCOUNTER — Encounter: Payer: Self-pay | Admitting: Obstetrics & Gynecology

## 2018-03-14 VITALS — BP 156/82 | HR 104 | Ht 62.5 in | Wt 213.0 lb

## 2018-03-14 DIAGNOSIS — D071 Carcinoma in situ of vulva: Secondary | ICD-10-CM

## 2018-03-14 NOTE — Progress Notes (Signed)
Chief Complaint  Patient presents with  . Gynecologic Exam    referred for [facility] vaginal area      62 y.o. No obstetric history on file. No LMP recorded. Patient has had a hysterectomy. The current method of family planning is status post hysterectomy.  Outpatient Encounter Medications as of 03/14/2018  Medication Sig  . albuterol (PROVENTIL HFA;VENTOLIN HFA) 108 (90 Base) MCG/ACT inhaler Inhale 2 puffs into the lungs every 6 (six) hours as needed for wheezing or shortness of breath.  . ALPRAZolam (XANAX) 0.5 MG tablet Take 1 tablet (0.5 mg total) by mouth 2 (two) times daily.  Marland Kitchen Apple Cid Vn-Grn Tea-Bit Or-Cr (APPLE CIDER VINEGAR PLUS) TABS Take by mouth.  . co-enzyme Q-10 30 MG capsule Take 30 mg by mouth 3 (three) times daily.  . fenofibrate (TRICOR) 145 MG tablet Take 1 tablet (145 mg total) by mouth daily.  . Flaxseed, Linseed, (FLAXSEED OIL) 1000 MG CAPS Take 1 capsule by mouth daily.  Nyoka Cowden Tea 315 MG CAPS Take by mouth.  Marland Kitchen HAWTHORNE BERRY PO Take by mouth.  . Inositol (INOSITOL-5) 324 MG TABS Take by mouth.  . levothyroxine (SYNTHROID) 75 MCG tablet TAKE 1 TABLET DAILY BEFORE BREAKFAST DOSE CHANGE  . losartan (COZAAR) 100 MG tablet Take 1 tablet (100 mg total) by mouth daily.  Marland Kitchen Lysine 500 MG CAPS Take 1 capsule by mouth daily.  . Milk Thistle 250 MG CAPS Take 1 capsule by mouth daily.  . Omega-3 Fatty Acids (FISH OIL) 1000 MG CAPS Take by mouth.  . SUPER B COMPLEX/C PO Take 1 capsule by mouth daily.  . TURMERIC CURCUMIN PO Take by mouth.  . zolpidem (AMBIEN) 5 MG tablet Take 1-2 tablets (5-10 mg total) by mouth at bedtime as needed.   No facility-administered encounter medications on file as of 03/14/2018.     Subjective Referred from Dr Juel Burrow office for "vaginal" CIS Exam reveals leukoplakia in several areas technically vulvar, not vaginal Asymptomatic completely been there for 3 years or so Past Medical History:  Diagnosis Date  . Allergy    sulfa  .  Anxiety 03/09/2012  . 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 Chronic hoarseness    years  . Dysphagia   . Hypertriglyceridemia     Past Surgical History:  Procedure Laterality Date  . COLON RESECTION    . KNEE SURGERY    . oral surgery tooth extraction    . TONSILLECTOMY    . VAGINAL HYSTERECTOMY      OB History   None     Allergies  Allergen Reactions  . Azithromycin Diarrhea  . Sulfa Antibiotics     Unable to recall    Social History   Socioeconomic History  . Marital status: Married    Spouse name: Not on file  . Number of children: 1  . Years of education: Not on file  . Highest education level: Not on file  Occupational History    Employer: [employer]  Social Needs  . Financial resource strain: Not on file  . Food insecurity:    Worry: Not on file    Inability: Not on file  . Transportation needs:    Medical: Not on file    Non-medical: Not on file  Tobacco Use  . Smoking status: Former Smoker    Packs/day: 0.25    Years: 40.00    Pack years: 10.00  Types: Cigarettes  . Smokeless tobacco: Never Used  Substance and Sexual Activity  . Alcohol use: Yes  . Drug use: No  . Sexual activity: Not Currently    Birth control/protection: None  Lifestyle  . Physical activity:    Days per week: Not on file    Minutes per session: Not on file  . Stress: Not on file  Relationships  . Social connections:    Talks on phone: Not on file    Gets together: Not on file    Attends religious service: Not on file    Active member of club or organization: Not on file    Attends meetings of clubs or organizations: Not on file    Relationship status: Not on file  Other Topics Concern  . Not on file  Social History Narrative  . Not on file    Family History  Problem Relation Age of Onset  . Cancer Mother 63       breast   . Heart attack Father   . Heart failure Father   .  Cancer Maternal Aunt 40       pancreas  . Cancer Maternal Grandfather        lung    Medications:       Current Outpatient Medications:  .  albuterol (PROVENTIL HFA;VENTOLIN HFA) 108 (90 Base) MCG/ACT inhaler, Inhale 2 puffs into the lungs every 6 (six) hours as needed for wheezing or shortness of breath., Disp: 1 Inhaler, Rfl: 0 .  ALPRAZolam (XANAX) 0.5 MG tablet, Take 1 tablet (0.5 mg total) by mouth 2 (two) times daily., Disp: 60 tablet, Rfl: 3 .  Apple Cid Vn-Grn Tea-Bit Or-Cr (APPLE CIDER VINEGAR PLUS) TABS, Take by mouth., Disp: , Rfl:  .  co-enzyme Q-10 30 MG capsule, Take 30 mg by mouth 3 (three) times daily., Disp: , Rfl:  .  fenofibrate (TRICOR) 145 MG tablet, Take 1 tablet (145 mg total) by mouth daily., Disp: 90 tablet, Rfl: 1 .  Flaxseed, Linseed, (FLAXSEED OIL) 1000 MG CAPS, Take 1 capsule by mouth daily., Disp: , Rfl:  .  Green Tea 315 MG CAPS, Take by mouth., Disp: , Rfl:  .  HAWTHORNE BERRY PO, Take by mouth., Disp: , Rfl:  .  Inositol (INOSITOL-5) 324 MG TABS, Take by mouth., Disp: , Rfl:  .  levothyroxine (SYNTHROID) 75 MCG tablet, TAKE 1 TABLET DAILY BEFORE BREAKFAST DOSE CHANGE, Disp: 90 tablet, Rfl: 1 .  losartan (COZAAR) 100 MG tablet, Take 1 tablet (100 mg total) by mouth daily., Disp: 90 tablet, Rfl: 1 .  Lysine 500 MG CAPS, Take 1 capsule by mouth daily., Disp: , Rfl:  .  Milk Thistle 250 MG CAPS, Take 1 capsule by mouth daily., Disp: , Rfl:  .  Omega-3 Fatty Acids (FISH OIL) 1000 MG CAPS, Take by mouth., Disp: , Rfl:  .  SUPER B COMPLEX/C PO, Take 1 capsule by mouth daily., Disp: , Rfl:  .  TURMERIC CURCUMIN PO, Take by mouth., Disp: , Rfl:  .  zolpidem (AMBIEN) 5 MG tablet, Take 1-2 tablets (5-10 mg total) by mouth at bedtime as needed., Disp: 60 tablet, Rfl: 3  Objective Blood pressure (!) 156/82, pulse (!) 104, height 5' 2.5" (1.588 m), weight 213 lb (96.6 kg).  General WDWN female NAD Vulva:  Several areas bilateral vulvar CIS(by biopsy), perimeal  area Vagina:  normal mucosa, no discharge Cervix:  absent Uterus:  uterus absent Adnexa: ovaries:,     Pertinent ROS  No burning with urination, frequency or urgency No nausea, vomiting or diarrhea Nor fever chills or other constitutional symptoms   Labs or studies Biopsy reveals CIS    Impression Diagnoses this Encounter::   ICD-10-CM   1. VIN III (vulvar intraepithelial neoplasia III) D07.1     Established relevant diagnosis(es):   Plan/Recommendations: No orders of the defined types were placed in this encounter.   Labs or Scans Ordered: No orders of the defined types were placed in this encounter.   Management:: Laser partial vulvectomy, bilateral, for treatment of vulvar CIS  Follow up Return in about 24 days (around 04/07/2018) for [place], with Dr Elonda Husky.         All questions were answered.

## 2018-03-15 ENCOUNTER — Other Ambulatory Visit: Payer: Self-pay

## 2018-03-15 DIAGNOSIS — R87623 High grade squamous intraepithelial lesion on cytologic smear of vagina (HGSIL): Secondary | ICD-10-CM

## 2018-03-15 DIAGNOSIS — N903 Dysplasia of vulva, unspecified: Secondary | ICD-10-CM

## 2018-03-18 NOTE — Patient Instructions (Signed)
[name]  03/18/2018     @PREFPERIOPPHARMACY @   Your procedure is scheduled on  03/30/2018   Report to [facility] at  1000   A.M.  Call this number if you have problems the morning of surgery:  [phone]   Remember:  Do not eat or drink after midnight.                        Take these medicines the morning of surgery with A SIP OF WATER  Xanax ( if needed), levothyroxine, losartan. Use your inhaler before you come.    Do not wear jewelry, make-up or nail polish.  Do not wear lotions, powders, or perfumes, or deodorant.  Do not shave 48 hours prior to surgery.  Men may shave face and neck.  Do not bring valuables to the hospital.  [facility] is not responsible for any belongings or valuables.  Contacts, dentures or bridgework may not be worn into surgery.  Leave your suitcase in the car.  After surgery it may be brought to your room.  For patients admitted to the hospital, discharge time will be determined by your treatment team.  Patients discharged the day of surgery will not be allowed to drive home.   Name and phone number of your driver:   family Special instructions:  none  Please read over the following fact sheets that you were given. Anesthesia Post-op Instructions and Care and Recovery After Surgery       Vulvectomy Vulvectomy is a surgical procedure to remove all or part of the outer female genital organs (vulva). The vulva includes the outer and inner lips of the vagina and the clitoris. You may need this surgery if you have a cancerous growth in your vulva. There are two types of vulvectomy:  A simple vulvectomy. This is the removal of the entire vulva.  A radical vulvectomy. A radical vulvectomy can be partial or complete. ? A partial radical vulvectomy is when part of the vulva and surrounding deep tissue is removed. ? A complete radical vulvectomy is when the vulva, clitoris, and surrounding deep tissue is removed.  During a  radical vulvectomy, some lymph nodes near the vulva may also be removed. Tell a health care provider about:  Any allergies you have.  All medicines you are taking, including vitamins, herbs, eye drops, creams, and over-the-counter medicines.  Any problems you or family members have had with anesthetic medicines.  Any blood disorders you have.  Any surgeries you have had.  Any medical conditions you have.  Whether you are pregnant or may be pregnant. What are the risks? Generally, this is a safe procedure. However, problems may occur, including:  Infection.  Bleeding.  Allergic reactions to medicines.  Damage to other structures or organs.  Urinary tract infections.  Lymphedema. This is when your legs swell after the removal of lymph nodes from your groin area.  Pain or decreased sexual pleasure when having sex.  Long-term vaginal swelling, tightness, numbness, or pain.  A blood clot that may travel to the lung (pulmonary embolism).  What happens before the procedure?  Follow instructions from your health care provider about eating or drinking restrictions.  Ask your health care provider about: ? Changing or stopping your regular medicines. This is especially important if you are taking diabetes medicines or blood thinners. ? Taking medicines such as aspirin and ibuprofen. These medicines can thin  your blood. Do not take these medicines before your procedure if your health care provider instructs you not to.  Ask your health care provider how your surgical site will be marked or identified.  You may be given antibiotic medicine to help prevent infection.  Plan to have someone take you home after the procedure.  If you will be going home right after the procedure, plan to have someone with you for 24 hours. What happens during the procedure?  To reduce your risk of infection: ? Your health care team will wash or sanitize their hands. ? Your skin will be washed  with soap.  An IV tube will be inserted into one of your veins.  You will be given one or more of the following: ? A medicine to help you relax (sedative). ? A medicine to make you fall asleep (general anesthetic). ? A medicine that is injected into your spine to numb the area below and slightly above the injection site (spinal anesthetic).  A tube (catheter) may be inserted through the outer opening of your bladder (urethra) to drain urine during and after surgery.  Depending on the type of vulvectomy you are having, your surgeon will make an incision and remove the affected area. This may include: ? Removing the entire vulva. ? Removing part of the vulva, surrounding deep tissue, and lymph nodes. ? Removing the vulva, clitoris, surrounding deep tissue, and lymph nodes.  If your lymph nodes are removed, a drain may be placed in the area to help avoid fluid buildup.  Your incisions will be closed and covered with a bandage (dressing). The procedure may vary among health care providers and hospitals. What happens after the procedure?  Your blood pressure, heart rate, breathing rate, and blood oxygen level will be monitored often until the medicines you were given have worn off.  You will get medicine for pain as needed.  You may get medicine to prevent constipation.  You may be on a liquid diet at first, and then switch to a regular diet.  When you are taking fluids well, your IV will be removed.  If your catheter was left in place after surgery, it will be removed when your health care provider approves.  You will be asked to breathe deeply and to get out of bed and walk as soon as you can.  You may have to wear compression stockings. These stockings help to prevent blood clots and reduce swelling in your legs.  Do not drive for 24 hours if you received a sedative. This information is not intended to replace advice given to you by your health care provider. Make sure you  discuss any questions you have with your health care provider. Document Released: 06/14/2015 Document Revised: 10/24/2015 Document Reviewed: 05/13/2015 Elsevier Interactive Patient Education  2018 [place] After Refer to this sheet in the next few weeks. These instructions provide you with information about caring for yourself after your procedure. Your health care provider may also give you more specific instructions. Your treatment has been planned according to current medical practices, but problems sometimes occur. Call your health care provider if you have any problems or questions after your procedure. What can I expect after the procedure? After the procedure, it is common to have:  Vaginal pain.  Vaginal numbness.  Vaginal swelling.  Bloody vaginal discharge.  Follow these instructions at home: Activity   Rest as told by your health care provider.  Do not lift, push,  or pull more than 5 lb (2.3 kg).  Avoid activities that require a lot of energy for as long as told by your health care provider. This includes any exercise.  Raise (elevate) your legs while sitting or lying down.  Avoid standing or sitting in one place for long periods of time.  Do not cross your legs, especially when sitting. Bathing  Do not take baths, swim, or use a hot tub until your health care provider approves. Ask your health care provider if you can take showers. You may only be allowed to take sponge baths for bathing.  After passing urine or a bowel movement, wipe yourself from front to back and clean your vaginal area using a spray bottle.  If told by your health care provider, take a sitz bath to help with discomfort. This is a warm water bath you take while sitting down. ? Do this 3-4 times per day, or as often as told by your health care provider. ? The water should only come up to your hips and cover your buttocks. ? You may pat the area dry with a soft, clean  towel. If needed, you may then gently dry the area with a hair dryer on a cool setting for 5-10 minutes. An enclosed box fan may also be used to gently dry the area. Incision care   Follow instructions from your health care provider about how to take care of your incision. Make sure you: ? Wash your hands with soap and water before you change your bandage (dressing). If soap and water are not available, use hand sanitizer. ? Change your dressing as told by your health care provider. ? Leave stitches (sutures), skin glue, adhesive strips, or surgical clips in place. These skin closures may need to stay in place for 2 weeks or longer. If adhesive strip edges start to loosen and curl up, you may trim the loose edges. Do not remove adhesive strips completely unless your health care provider tells you to do that.  Check your incision area every day for signs of infection. Check for: ? More redness, swelling, or pain. ? More fluid or blood. ? Warmth. ? Pus or a bad smell. Lifestyle  Do not douche or use tampons until your health care provider approves.  Do not have sex until your health care provider approves. Tell your health care provider if you have pain or numbness when you return to sexual activity.  Wear comfortable, loose-fitting clothing. Driving  Do not drive or operate heavy machinery while taking prescription pain medicine.  Do not drive for 24 hours if you received a medicine to help you relax (sedative). General instructions   Take over-the-counter and prescription medicines only as told by your health care provider.  Take a stool softener to help prevent constipation as told by your health care provider. You may need to do this if you are taking prescription pain medicine.  Drink enough fluid to keep your urine clear or pale yellow.  If you were sent home with a drain, take care of it as told by your health care provider.  Wear compression stockings as told by your health  care provider. These stockings help to prevent blood clots and reduce swelling in your legs.  Keep all follow-up visits as told by your health care provider. This is important. Contact a health care provider if:  You have more redness, swelling, or pain around your incision.  You have more fluid or blood coming from your  incision.  Your incision feels warm to the touch.  You have pus or a bad smell coming from your incision.  You have a fever.  You have painful or bloody urination.  You feel nauseous or you vomit.  You develop diarrhea.  You develop constipation.  You develop a rash.  You feel dizzy or light-headed.  You have pain that does not get better with medicine.  Your incision breaks open. Get help right away if:  You faint.  You develop leg or chest pain.  You develop abdominal pain.  You develop shortness of breath. This information is not intended to replace advice given to you by your health care provider. Make sure you discuss any questions you have with your health care provider. Document Released: 12/31/2003 Document Revised: 10/24/2015 Document Reviewed: 05/13/2015 Elsevier Interactive Patient Education  2018 [place] Anesthesia, Adult General anesthesia is the use of medicines to make a person "go to sleep" (be unconscious) for a medical procedure. General anesthesia is often recommended when a procedure:  Is long.  Requires you to be still or in an unusual position.  Is major and can cause you to lose blood.  Is impossible to do without general anesthesia.  The medicines used for general anesthesia are called general anesthetics. In addition to making you sleep, the medicines:  Prevent pain.  Control your blood pressure.  Relax your muscles.  Tell a health care provider about:  Any allergies you have.  All medicines you are taking, including vitamins, herbs, eye drops, creams, and over-the-counter medicines.  Any  problems you or family members have had with anesthetic medicines.  Types of anesthetics you have had in the past.  Any bleeding disorders you have.  Any surgeries you have had.  Any medical conditions you have.  Any history of heart or lung conditions, such as heart failure, sleep apnea, or chronic obstructive pulmonary disease (COPD).  Whether you are pregnant or may be pregnant.  Whether you use tobacco, alcohol, marijuana, or street drugs.  Any history of Armed forces logistics/support/administrative officer.  Any history of depression or anxiety. What are the risks? Generally, this is a safe procedure. However, problems may occur, including:  Allergic reaction to anesthetics.  Lung and heart problems.  Inhaling food or liquids from your stomach into your lungs (aspiration).  Injury to nerves.  Waking up during your procedure and being unable to move (rare).  Extreme agitation or a state of mental confusion (delirium) when you wake up from the anesthetic.  Air in the bloodstream, which can lead to stroke.  These problems are more likely to develop if you are having a major surgery or if you have an advanced medical condition. You can prevent some of these complications by answering all of your health care provider's questions thoroughly and by following all pre-procedure instructions. General anesthesia can cause side effects, including:  Nausea or vomiting  A sore throat from the breathing tube.  Feeling cold or shivery.  Feeling tired, washed out, or achy.  Sleepiness or drowsiness.  Confusion or agitation.  What happens before the procedure? Staying hydrated Follow instructions from your health care provider about hydration, which may include:  Up to 2 hours before the procedure - you may continue to drink clear liquids, such as water, clear fruit juice, black coffee, and plain tea.  Eating and drinking restrictions Follow instructions from your health care provider about eating and  drinking, which may include:  8 hours before the procedure -  stop eating heavy meals or foods such as meat, fried foods, or fatty foods.  6 hours before the procedure - stop eating light meals or foods, such as toast or cereal.  6 hours before the procedure - stop drinking milk or drinks that contain milk.  2 hours before the procedure - stop drinking clear liquids.  Medicines  Ask your health care provider about: ? Changing or stopping your regular medicines. This is especially important if you are taking diabetes medicines or blood thinners. ? Taking medicines such as aspirin and ibuprofen. These medicines can thin your blood. Do not take these medicines before your procedure if your health care provider instructs you not to. ? Taking new dietary supplements or medicines. Do not take these during the week before your procedure unless your health care provider approves them.  If you are told to take a medicine or to continue taking a medicine on the day of the procedure, take the medicine with sips of water. General instructions   Ask if you will be going home the same day, the following day, or after a longer hospital stay. ? Plan to have someone take you home. ? Plan to have someone stay with you for the first 24 hours after you leave the hospital or clinic.  For 3-6 weeks before the procedure, try not to use any tobacco products, such as cigarettes, chewing tobacco, and e-cigarettes.  You may brush your teeth on the morning of the procedure, but make sure to spit out the toothpaste. What happens during the procedure?  You will be given anesthetics through a mask and through an IV tube in one of your veins.  You may receive medicine to help you relax (sedative).  As soon as you are asleep, a breathing tube may be used to help you breathe.  An anesthesia specialist will stay with you throughout the procedure. He or she will help keep you comfortable and safe by continuing to  give you medicines and adjusting the amount of medicine that you get. He or she will also watch your blood pressure, pulse, and oxygen levels to make sure that the anesthetics do not cause any problems.  If a breathing tube was used to help you breathe, it will be removed before you wake up. The procedure may vary among health care providers and hospitals. What happens after the procedure?  You will wake up, often slowly, after the procedure is complete, usually in a recovery area.  Your blood pressure, heart rate, breathing rate, and blood oxygen level will be monitored until the medicines you were given have worn off.  You may be given medicine to help you calm down if you feel anxious or agitated.  If you will be going home the same day, your health care provider may check to make sure you can stand, drink, and urinate.  Your health care providers will treat your pain and side effects before you go home.  Do not drive for 24 hours if you received a sedative.  You may: ? Feel nauseous and vomit. ? Have a sore throat. ? Have mental slowness. ? Feel cold or shivery. ? Feel sleepy. ? Feel tired. ? Feel sore or achy, even in parts of your body where you did not have surgery. This information is not intended to replace advice given to you by your health care provider. Make sure you discuss any questions you have with your health care provider. Document Released: 08/25/2007 Document Revised: 10/29/2015 Document  Reviewed: 05/02/2015 Elsevier Interactive Patient Education  2018 [place] Anesthesia, Adult, Care After These instructions provide you with information about caring for yourself after your procedure. Your health care provider may also give you more specific instructions. Your treatment has been planned according to current medical practices, but problems sometimes occur. Call your health care provider if you have any problems or questions after your procedure. What  can I expect after the procedure? After the procedure, it is common to have:  Vomiting.  A sore throat.  Mental slowness.  It is common to feel:  Nauseous.  Cold or shivery.  Sleepy.  Tired.  Sore or achy, even in parts of your body where you did not have surgery.  Follow these instructions at home: For at least 24 hours after the procedure:  Do not: ? Participate in activities where you could fall or become injured. ? Drive. ? Use heavy machinery. ? Drink alcohol. ? Take sleeping pills or medicines that cause drowsiness. ? Make important decisions or sign legal documents. ? Take care of children on your own.  Rest. Eating and drinking  If you vomit, drink water, juice, or soup when you can drink without vomiting.  Drink enough fluid to keep your urine clear or pale yellow.  Make sure you have little or no nausea before eating solid foods.  Follow the diet recommended by your health care provider. General instructions  Have a responsible adult stay with you until you are awake and alert.  Return to your normal activities as told by your health care provider. Ask your health care provider what activities are safe for you.  Take over-the-counter and prescription medicines only as told by your health care provider.  If you smoke, do not smoke without supervision.  Keep all follow-up visits as told by your health care provider. This is important. Contact a health care provider if:  You continue to have nausea or vomiting at home, and medicines are not helpful.  You cannot drink fluids or start eating again.  You cannot urinate after 8-12 hours.  You develop a skin rash.  You have fever.  You have increasing redness at the site of your procedure. Get help right away if:  You have difficulty breathing.  You have chest pain.  You have unexpected bleeding.  You feel that you are having a life-threatening or urgent problem. This information is not  intended to replace advice given to you by your health care provider. Make sure you discuss any questions you have with your health care provider. Document Released: 08/24/2000 Document Revised: 10/21/2015 Document Reviewed: 05/02/2015 Elsevier Interactive Patient Education  [employer].

## 2018-03-21 ENCOUNTER — Ambulatory Visit (INDEPENDENT_AMBULATORY_CARE_PROVIDER_SITE_OTHER): Payer: Managed Care, Other (non HMO) | Admitting: Family Medicine

## 2018-03-21 ENCOUNTER — Encounter: Payer: Self-pay | Admitting: Family Medicine

## 2018-03-21 VITALS — BP 165/95 | HR 87 | Temp 97.4°F | Ht 62.5 in | Wt 216.2 lb

## 2018-03-21 DIAGNOSIS — H1033 Unspecified acute conjunctivitis, bilateral: Secondary | ICD-10-CM

## 2018-03-21 MED ORDER — POLYMYXIN B-TRIMETHOPRIM 10000-0.1 UNIT/ML-% OP SOLN: 1.0000 [drp] | OPHTHALMIC | 0 refills | Status: DC

## 2018-03-21 NOTE — Progress Notes (Signed)
BP (!) 165/95   Pulse 87   Temp (!) 97.4 F (36.3 C) (Oral)   Ht 5' 2.5" (1.588 m)   Wt 216 lb 3.2 oz (98.1 kg)   BMI 38.91 kg/m    Subjective:    Patient ID: [name], female    DOB: [birth year], 62 y.o.   MRN: [id number]  HPI: [name] is a 63 y.o. female presenting on 03/21/2018 for Conjunctivitis (Left started saturday and right started yesterday )   HPI Bilateral eye swelling and redness and discharge Patient is coming in with complaints of bilateral eye swelling and redness and discharge that is been going on for the past 2 days.  Initially started with the left eye and then spread to her right eye yesterday.  She has a lot of irritation and pruritus and has been having a lot of discharge from both of her eyes as well as as well as matting.  She denies any pain in the eyes itself or any trouble seeing except for when she has a lot of discharge.  She denies any sick contacts that she knows of.  She denies any fevers or chills.  Relevant past medical, surgical, family and social history reviewed and updated as indicated. Interim medical history since our last visit reviewed. Allergies and medications reviewed and updated.  Review of Systems  Constitutional: Negative for chills and fever.  Eyes: Positive for pain and discharge. Negative for visual disturbance.  Respiratory: Negative for chest tightness and shortness of breath.   Cardiovascular: Negative for chest pain and leg swelling.  Musculoskeletal: Negative for back pain and gait problem.  Skin: Negative for rash.  Neurological: Negative for light-headedness and headaches.  Psychiatric/Behavioral: Negative for agitation and behavioral problems.  All other systems reviewed and are negative.   Per HPI unless specifically indicated above   Allergies as of 03/21/2018      Reactions   Azithromycin Diarrhea   Sulfa Antibiotics Other (See Comments)   Unable to recall      Medication List        Accurate as  of 03/21/18 11:57 AM. Always use your most recent med list.          albuterol 108 (90 Base) MCG/ACT inhaler Commonly known as:  PROVENTIL HFA;VENTOLIN HFA Inhale 2 puffs into the lungs every 6 (six) hours as needed for wheezing or shortness of breath.   ALPRAZolam 0.5 MG tablet Commonly known as:  XANAX Take 1 tablet (0.5 mg total) by mouth 2 (two) times daily.   APPLE CIDER VINEGAR PLUS Tabs Take 1 tablet by mouth daily.   Coenzyme Q10 100 MG capsule Take 100 mg by mouth daily.   fenofibrate 145 MG tablet Commonly known as:  TRICOR Take 1 tablet (145 mg total) by mouth daily.   Fish Oil 1000 MG Caps Take 1,000 mg by mouth daily.   Flaxseed Oil 1000 MG Caps Take 1,000 mg by mouth daily.   HAWTHORNE BERRY PO Take 565 mg by mouth daily.   INOSITOL PO Take 1 capsule by mouth daily.   levothyroxine 75 MCG tablet Commonly known as:  SYNTHROID, LEVOTHROID TAKE 1 TABLET DAILY BEFORE BREAKFAST DOSE CHANGE   losartan 100 MG tablet Commonly known as:  COZAAR Take 1 tablet (100 mg total) by mouth daily.   Lysine 500 MG Caps Take 500 mg by mouth 2 (two) times a week.   Milk Thistle 250 MG Caps Take 250 mg by mouth daily.  OVER THE COUNTER MEDICATION Take 1 capsule by mouth 2 (two) times daily. Liva Plex Supplement   OVER THE COUNTER MEDICATION Take 3 tablets by mouth 3 (three) times daily. Cata Plex Supplement   SUPER B COMPLEX/C PO Take 1 capsule by mouth daily.   trimethoprim-polymyxin b ophthalmic solution Commonly known as:  POLYTRIM Place 1 drop into both eyes every 4 (four) hours. Give enough for 7 days   zolpidem 5 MG tablet Commonly known as:  AMBIEN Take 1-2 tablets (5-10 mg total) by mouth at bedtime as needed.          Objective:    BP (!) 165/95   Pulse 87   Temp (!) 97.4 F (36.3 C) (Oral)   Ht 5' 2.5" (1.588 m)   Wt 216 lb 3.2 oz (98.1 kg)   BMI 38.91 kg/m   Wt Readings from Last 3 Encounters:  03/21/18 216 lb 3.2 oz (98.1 kg)    03/14/18 213 lb (96.6 kg)  01/18/18 214 lb (97.1 kg)    Physical Exam  Constitutional: She is oriented to person, place, and time. She appears well-developed and well-nourished. No distress.  Eyes: EOM are normal. Right eye exhibits discharge and exudate. Left eye exhibits discharge and exudate. Right conjunctiva is injected. Left conjunctiva is injected.  Neurological: She is alert and oriented to person, place, and time. Coordination normal.  Skin: Skin is warm and dry. No rash noted. She is not diaphoretic.  Psychiatric: She has a normal mood and affect. Her behavior is normal.  Nursing note and vitals reviewed.       Assessment & Plan:   Problem List Items Addressed This Visit    None    Visit Diagnoses    Acute conjunctivitis of both eyes, unspecified acute conjunctivitis type    -  Primary   Relevant Medications   trimethoprim-polymyxin b (POLYTRIM) ophthalmic solution       Follow up plan: Return if symptoms worsen or fail to improve.  Counseling provided for all of the vaccine components No orders of the defined types were placed in this encounter.   Caryl Pina, MD [facility] 03/21/2018, 11:57 AM

## 2018-03-24 ENCOUNTER — Encounter (HOSPITAL_COMMUNITY)
Admission: RE | Admit: 2018-03-24 | Discharge: 2018-03-24 | Disposition: A | Discharge status: Home / Self Care | Payer: Managed Care, Other (non HMO) | Source: Ambulatory Visit | Attending: Obstetrics & Gynecology | Admitting: Obstetrics & Gynecology

## 2018-03-24 ENCOUNTER — Encounter (HOSPITAL_COMMUNITY): Payer: Self-pay

## 2018-03-24 ENCOUNTER — Other Ambulatory Visit: Payer: Self-pay

## 2018-03-24 ENCOUNTER — Other Ambulatory Visit: Payer: Self-pay | Admitting: Obstetrics & Gynecology

## 2018-03-24 DIAGNOSIS — Z01818 Encounter for other preprocedural examination: Secondary | ICD-10-CM | POA: Diagnosis not present

## 2018-03-24 HISTORY — DX: Essential (primary) hypertension: I10

## 2018-03-24 LAB — URINALYSIS, ROUTINE W REFLEX MICROSCOPIC
Bacteria, UA: NONE SEEN
Bilirubin Urine: NEGATIVE
GLUCOSE, UA: NEGATIVE mg/dL
Hgb urine dipstick: NEGATIVE
Ketones, ur: NEGATIVE mg/dL
Nitrite: NEGATIVE
PH: 6 (ref 5.0–8.0)
PROTEIN: NEGATIVE mg/dL
SPECIFIC GRAVITY, URINE: 1.016 (ref 1.005–1.030)

## 2018-03-24 LAB — CBC
HEMATOCRIT: 41 % (ref 36.0–46.0)
HEMOGLOBIN: 13.1 g/dL (ref 12.0–15.0)
MCH: 29.1 pg (ref 26.0–34.0)
MCHC: 32 g/dL (ref 30.0–36.0)
MCV: 91.1 fL (ref 80.0–100.0)
NRBC: 0 % (ref 0.0–0.2)
Platelets: 254 10*3/uL (ref 150–400)
RBC: 4.5 MIL/uL (ref 3.87–5.11)
RDW: 13.5 % (ref 11.5–15.5)
WBC: 7.3 10*3/uL (ref 4.0–10.5)

## 2018-03-24 LAB — RAPID HIV SCREEN (HIV 1/2 AB+AG)
HIV 1/2 Antibodies: NONREACTIVE
HIV-1 P24 ANTIGEN - HIV24: NONREACTIVE

## 2018-03-24 LAB — COMPREHENSIVE METABOLIC PANEL
ALK PHOS: 43 U/L (ref 38–126)
ALT: 28 U/L (ref 0–44)
AST: 19 U/L (ref 15–41)
Albumin: 4.4 g/dL (ref 3.5–5.0)
Anion gap: 10 (ref 5–15)
BILIRUBIN TOTAL: 0.7 mg/dL (ref 0.3–1.2)
BUN: 16 mg/dL (ref 8–23)
CALCIUM: 9.3 mg/dL (ref 8.9–10.3)
CO2: 24 mmol/L (ref 22–32)
CREATININE: 1.04 mg/dL — AB (ref 0.44–1.00)
Chloride: 106 mmol/L (ref 98–111)
GFR calc Af Amer: >60 mL/min (ref >60)
GFR, EST NON AFRICAN AMERICAN: 56 mL/min — AB (ref >60)
Glucose, Bld: 119 mg/dL — ABNORMAL HIGH (ref 70–99)
Potassium: 4.1 mmol/L (ref 3.5–5.1)
Sodium: 140 mmol/L (ref 135–145)
TOTAL PROTEIN: 7.4 g/dL (ref 6.5–8.1)

## 2018-03-30 ENCOUNTER — Encounter (HOSPITAL_COMMUNITY)
Admission: RE | Disposition: A | Discharge status: Home / Self Care | Payer: Self-pay | Source: Ambulatory Visit | Attending: Obstetrics & Gynecology

## 2018-03-30 ENCOUNTER — Ambulatory Visit (HOSPITAL_COMMUNITY)
Admission: RE | Admit: 2018-03-30 | Discharge: 2018-03-30 | Disposition: A | Discharge status: Home / Self Care | Payer: Managed Care, Other (non HMO) | Source: Ambulatory Visit | Attending: Obstetrics & Gynecology | Admitting: Obstetrics & Gynecology

## 2018-03-30 ENCOUNTER — Encounter (HOSPITAL_COMMUNITY): Payer: Self-pay | Admitting: Anesthesiology

## 2018-03-30 ENCOUNTER — Ambulatory Visit (HOSPITAL_COMMUNITY): Payer: Managed Care, Other (non HMO) | Admitting: Anesthesiology

## 2018-03-30 DIAGNOSIS — Z79899 Other long term (current) drug therapy: Secondary | ICD-10-CM | POA: Insufficient documentation

## 2018-03-30 DIAGNOSIS — E039 Hypothyroidism, unspecified: Secondary | ICD-10-CM | POA: Insufficient documentation

## 2018-03-30 DIAGNOSIS — D071 Carcinoma in situ of vulva: Secondary | ICD-10-CM | POA: Diagnosis present

## 2018-03-30 DIAGNOSIS — Z87891 Personal history of nicotine dependence: Secondary | ICD-10-CM | POA: Diagnosis not present

## 2018-03-30 DIAGNOSIS — Z882 Allergy status to sulfonamides status: Secondary | ICD-10-CM | POA: Insufficient documentation

## 2018-03-30 DIAGNOSIS — F419 Anxiety disorder, unspecified: Secondary | ICD-10-CM | POA: Diagnosis not present

## 2018-03-30 DIAGNOSIS — E781 Pure hyperglyceridemia: Secondary | ICD-10-CM | POA: Insufficient documentation

## 2018-03-30 DIAGNOSIS — Z881 Allergy status to other antibiotic agents status: Secondary | ICD-10-CM | POA: Insufficient documentation

## 2018-03-30 DIAGNOSIS — F329 Major depressive disorder, single episode, unspecified: Secondary | ICD-10-CM | POA: Diagnosis not present

## 2018-03-30 DIAGNOSIS — G4700 Insomnia, unspecified: Secondary | ICD-10-CM | POA: Diagnosis not present

## 2018-03-30 DIAGNOSIS — I10 Essential (primary) hypertension: Secondary | ICD-10-CM | POA: Diagnosis not present

## 2018-03-30 HISTORY — PX: VULVECTOMY PARTIAL: SHX6187

## 2018-03-30 SURGERY — VULVECTOMY, PARTIAL
Anesthesia: General

## 2018-03-30 MED ORDER — EPHEDRINE SULFATE 50 MG/ML IJ SOLN
INTRAMUSCULAR | Status: DC | PRN
  Administered 2018-03-30: 10 mg via INTRAVENOUS

## 2018-03-30 MED ORDER — LIDOCAINE HCL (PF) 1 % IJ SOLN
INTRAMUSCULAR | Status: AC
  Filled 2018-03-30: qty 5

## 2018-03-30 MED ORDER — ROCURONIUM BROMIDE 100 MG/10ML IV SOLN
INTRAVENOUS | Status: DC | PRN
  Administered 2018-03-30: 20 mg via INTRAVENOUS

## 2018-03-30 MED ORDER — OXYCODONE-ACETAMINOPHEN 5-325 MG PO TABS: 1.0000 | ORAL_TABLET | ORAL | 0 refills | Status: DC | PRN

## 2018-03-30 MED ORDER — WATER FOR IRRIGATION, STERILE IR SOLN
Status: DC | PRN
  Administered 2018-03-30: 1000 mL

## 2018-03-30 MED ORDER — FENTANYL CITRATE (PF) 250 MCG/5ML IJ SOLN
INTRAMUSCULAR | Status: AC
  Filled 2018-03-30: qty 5

## 2018-03-30 MED ORDER — ONDANSETRON HCL 4 MG/2ML IJ SOLN
INTRAMUSCULAR | Status: DC | PRN
  Administered 2018-03-30: 4 mg via INTRAVENOUS

## 2018-03-30 MED ORDER — LACTATED RINGERS IV SOLN
INTRAVENOUS | Status: DC
  Administered 2018-03-30 (×2): via INTRAVENOUS

## 2018-03-30 MED ORDER — KETOROLAC TROMETHAMINE 10 MG PO TABS: 10.0000 mg | ORAL_TABLET | Freq: Three times a day (TID) | ORAL | 0 refills | Status: DC | PRN

## 2018-03-30 MED ORDER — SUGAMMADEX SODIUM 200 MG/2ML IV SOLN
INTRAVENOUS | Status: DC | PRN
  Administered 2018-03-30: 195 mg via INTRAVENOUS

## 2018-03-30 MED ORDER — KETOROLAC TROMETHAMINE 30 MG/ML IJ SOLN
30.0000 mg | Freq: Once | INTRAMUSCULAR | Status: AC
  Administered 2018-03-30: 30 mg via INTRAVENOUS
  Filled 2018-03-30: qty 1

## 2018-03-30 MED ORDER — PROPOFOL 10 MG/ML IV BOLUS
INTRAVENOUS | Status: DC | PRN
  Administered 2018-03-30: 180 mg via INTRAVENOUS
  Administered 2018-03-30: 50 mg via INTRAVENOUS

## 2018-03-30 MED ORDER — SILVER SULFADIAZINE 1 % EX CREA
TOPICAL_CREAM | CUTANEOUS | Status: AC
  Filled 2018-03-30: qty 50

## 2018-03-30 MED ORDER — SUCCINYLCHOLINE CHLORIDE 20 MG/ML IJ SOLN
INTRAMUSCULAR | Status: DC | PRN
  Administered 2018-03-30: 140 mg via INTRAVENOUS

## 2018-03-30 MED ORDER — MIDAZOLAM HCL 2 MG/2ML IJ SOLN
INTRAMUSCULAR | Status: AC
  Filled 2018-03-30: qty 2

## 2018-03-30 MED ORDER — ONDANSETRON 4 MG PO TBDP
ORAL_TABLET | ORAL | Status: AC
  Filled 2018-03-30: qty 2

## 2018-03-30 MED ORDER — MIDAZOLAM HCL 5 MG/5ML IJ SOLN
INTRAMUSCULAR | Status: DC | PRN
  Administered 2018-03-30: 2 mg via INTRAVENOUS

## 2018-03-30 MED ORDER — GLYCOPYRROLATE 0.2 MG/ML IJ SOLN
INTRAMUSCULAR | Status: AC
  Filled 2018-03-30: qty 1

## 2018-03-30 MED ORDER — EPHEDRINE SULFATE 50 MG/ML IJ SOLN
INTRAMUSCULAR | Status: AC
  Filled 2018-03-30: qty 1

## 2018-03-30 MED ORDER — SODIUM CHLORIDE 0.9 % IJ SOLN
INTRAMUSCULAR | Status: AC
  Filled 2018-03-30: qty 10

## 2018-03-30 MED ORDER — CEFAZOLIN SODIUM-DEXTROSE 2-4 GM/100ML-% IV SOLN
2.0000 g | INTRAVENOUS | Status: AC
  Administered 2018-03-30: 2 g via INTRAVENOUS
  Filled 2018-03-30: qty 100

## 2018-03-30 MED ORDER — BUPIVACAINE LIPOSOME 1.3 % IJ SUSP
INTRAMUSCULAR | Status: AC
  Filled 2018-03-30: qty 20

## 2018-03-30 MED ORDER — LIDOCAINE HCL (CARDIAC) PF 50 MG/5ML IV SOSY
PREFILLED_SYRINGE | INTRAVENOUS | Status: DC | PRN
  Administered 2018-03-30: 40 mg via INTRAVENOUS

## 2018-03-30 MED ORDER — HYDROMORPHONE HCL 1 MG/ML IJ SOLN: 0.2500 mg | INTRAMUSCULAR | Status: DC | PRN

## 2018-03-30 MED ORDER — BUPIVACAINE LIPOSOME 1.3 % IJ SUSP
INTRAMUSCULAR | Status: DC | PRN
  Administered 2018-03-30: 20 mL

## 2018-03-30 MED ORDER — SILVER SULFADIAZINE 1 % EX CREA
TOPICAL_CREAM | CUTANEOUS | Status: DC | PRN
  Administered 2018-03-30: 1 via TOPICAL

## 2018-03-30 MED ORDER — FENTANYL CITRATE (PF) 100 MCG/2ML IJ SOLN
INTRAMUSCULAR | Status: DC | PRN
  Administered 2018-03-30 (×4): 50 ug via INTRAVENOUS

## 2018-03-30 MED ORDER — ONDANSETRON 8 MG PO TBDP: 8.0000 mg | ORAL_TABLET | Freq: Three times a day (TID) | ORAL | 0 refills | Status: DC | PRN

## 2018-03-30 MED ORDER — OXYCODONE-ACETAMINOPHEN 5-325 MG PO TABS
ORAL_TABLET | ORAL | Status: AC
  Filled 2018-03-30: qty 1

## 2018-03-30 MED ORDER — PROPOFOL 10 MG/ML IV BOLUS
INTRAVENOUS | Status: AC
  Filled 2018-03-30: qty 40

## 2018-03-30 MED ORDER — PROMETHAZINE HCL 25 MG/ML IJ SOLN: 6.2500 mg | INTRAMUSCULAR | Status: DC | PRN

## 2018-03-30 MED ORDER — HYDROCODONE-ACETAMINOPHEN 7.5-325 MG PO TABS: 1.0000 | ORAL_TABLET | Freq: Once | ORAL | Status: DC | PRN

## 2018-03-30 MED ORDER — MIDAZOLAM HCL 2 MG/2ML IJ SOLN: 0.5000 mg | Freq: Once | INTRAMUSCULAR | Status: DC | PRN

## 2018-03-30 MED ORDER — DEXAMETHASONE SODIUM PHOSPHATE 4 MG/ML IJ SOLN
INTRAMUSCULAR | Status: DC | PRN
  Administered 2018-03-30: 4 mg via INTRAVENOUS

## 2018-03-30 MED ORDER — SILVER SULFADIAZINE 1 % EX CREA: TOPICAL_CREAM | CUTANEOUS | 11 refills | Status: DC

## 2018-03-30 SURGICAL SUPPLY — 26 items
CLOTH BEACON ORANGE TIMEOUT ST (SAFETY) ×3 IMPLANT
COVER LIGHT HANDLE STERIS (MISCELLANEOUS) ×6 IMPLANT
DRAPE HALF SHEET 40X57 (DRAPES) ×3 IMPLANT
GLOVE BIOGEL PI IND STRL 7.0 (GLOVE) ×2 IMPLANT
GLOVE BIOGEL PI IND STRL 8 (GLOVE) ×2 IMPLANT
GLOVE BIOGEL PI INDICATOR 7.0 (GLOVE) ×1
GLOVE BIOGEL PI INDICATOR 8 (GLOVE) ×1
GLOVE ECLIPSE 6.5 STRL STRAW (GLOVE) ×3 IMPLANT
GLOVE ECLIPSE 8.0 STRL XLNG CF (GLOVE) ×3 IMPLANT
GOWN STRL REUS W/TWL LRG LVL3 (GOWN DISPOSABLE) ×3 IMPLANT
GOWN STRL REUS W/TWL XL LVL3 (GOWN DISPOSABLE) ×3 IMPLANT
KIT TURNOVER KIT A (KITS) ×6 IMPLANT
LASER FIBER DISP 1000U (UROLOGICAL SUPPLIES) ×3 IMPLANT
MANIFOLD NEPTUNE II (INSTRUMENTS) ×3 IMPLANT
NEEDLE HYPO 22GX1.5 SAFETY (NEEDLE) ×3 IMPLANT
NS IRRIG 1000ML POUR BTL (IV SOLUTION) ×3 IMPLANT
PACK PERI GYN (CUSTOM PROCEDURE TRAY) ×3 IMPLANT
PAD ARMBOARD 7.5X6 YLW CONV (MISCELLANEOUS) ×3 IMPLANT
PREFILTER SMOKE EVAC (FILTER) ×3 IMPLANT
SCOPETTES 8  STERILE (MISCELLANEOUS) ×1
SCOPETTES 8 STERILE (MISCELLANEOUS) ×2 IMPLANT
SET BASIN LINEN APH (SET/KITS/TRAYS/PACK) ×3 IMPLANT
SYRINGE 20CC LL (MISCELLANEOUS) ×3 IMPLANT
TOWEL OR 17X26 4PK STRL BLUE (TOWEL DISPOSABLE) ×3 IMPLANT
TUBING SMOKE EVAC CO2 (TUBING) ×3 IMPLANT
WATER STERILE IRR 1000ML POUR (IV SOLUTION) ×3 IMPLANT

## 2018-03-30 NOTE — Op Note (Signed)
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 no invasive carcinoma Subsequently I saw her in the office and confirmed the multifocal nature of her lesions and she is brought in today for laser management  Description of operation: Patient was taken to the operating room where she was placed in the supine position and underwent general endotracheal anesthesia She was placed in dorsolithotomy position She was prepped and draped in the usual sterile fashion Acetic acid was used to better delineate the dysplastic lesions Photodocumentation was then performed and digitally uploaded to her the patient's chart The laser was placed on a power of 3 and a rate of 15 Laser was used to get a good margin around all leukoplakic lesions and carried down to a depth to the subcutaneous tissue Each layer was taken down with the laser and then eschar was removed with a wet Ray-Tec Was performed x3 Subsequently 20 cc of Exparel 0.3% was injected into the surgical area for postoperative pain management There was good hemostasis and no blood loss Patient was awakened from anesthesia in good stable condition Taken to the PACU doing well To be discharged home and followed up in the office in 1 to 2 weeks for follow-up  Florian Buff, MD 03/30/2018 12:52 PM

## 2018-03-30 NOTE — Transfer of Care (Signed)
Immediate Anesthesia Transfer of Care Note  Patient: [name]  Procedure(s) Performed: LASER PARTIAL VULVECTOMY FOR VULVUL INTRAEPITHELIA NEOPLASIS III (Bilateral )  Patient Location: PACU  Anesthesia Type:General  Level of Consciousness: sedated and patient cooperative  Airway & Oxygen Therapy: Patient Spontanous Breathing and Patient connected to face mask oxygen  Post-op Assessment: Report given to RN and Post -op Vital signs reviewed and stable  Post vital signs: Reviewed and stable  Last Vitals:  Vitals Value Taken Time  BP 127/80 03/30/2018  1:04 PM  Temp    Pulse 94 03/30/2018  1:07 PM  Resp 14 03/30/2018  1:07 PM  SpO2 100 % 03/30/2018  1:07 PM  Vitals shown include unvalidated device data.  Last Pain:  Vitals:   03/30/18 1033  TempSrc: Oral  PainSc: 0-No pain      Patients Stated Pain Goal: 5 (93/23/55 7322)  Complications: No apparent anesthesia complications

## 2018-03-30 NOTE — Discharge Instructions (Signed)
Vulvectomy, Care After Refer to this sheet in the next few weeks. These instructions provide you with information about caring for yourself after your procedure. Your health care provider may also give you more specific instructions. Your treatment has been planned according to current medical practices, but problems sometimes occur. Call your health care provider if you have any problems or questions after your procedure. What can I expect after the procedure? After the procedure, it is common to have:  Vaginal pain.  Vaginal numbness.  Vaginal swelling.  Bloody vaginal discharge.  Follow these instructions at home: Activity   Rest as told by your health care provider.  Do not lift, push, or pull more than 5 lb (2.3 kg).  Avoid activities that require a lot of energy for as long as told by your health care provider. This includes any exercise.  Raise (elevate) your legs while sitting or lying down.  Avoid standing or sitting in one place for long periods of time.  Do not cross your legs, especially when sitting. Bathing  Do not take baths, swim, or use a hot tub until your health care provider approves. Ask your health care provider if you can take showers. You may only be allowed to take sponge baths for bathing.  After passing urine or a bowel movement, wipe yourself from front to back and clean your vaginal area using a spray bottle.  If told by your health care provider, take a sitz bath to help with discomfort. This is a warm water bath you take while sitting down. ? Do this 3-4 times per day, or as often as told by your health care provider. ? The water should only come up to your hips and cover your buttocks. ? You may pat the area dry with a soft, clean towel. If needed, you may then gently dry the area with a hair dryer on a cool setting for 5-10 minutes. An enclosed box fan may also be used to gently dry the area. Incision care   Follow instructions from your  health care provider about how to take care of your incision. Make sure you: ? Wash your hands with soap and water before you change your bandage (dressing). If soap and water are not available, use hand sanitizer. ? Change your dressing as told by your health care provider. ? Leave stitches (sutures), skin glue, adhesive strips, or surgical clips in place. These skin closures may need to stay in place for 2 weeks or longer. If adhesive strip edges start to loosen and curl up, you may trim the loose edges. Do not remove adhesive strips completely unless your health care provider tells you to do that.  Check your incision area every day for signs of infection. Check for: ? More redness, swelling, or pain. ? More fluid or blood. ? Warmth. ? Pus or a bad smell. Lifestyle  Do not douche or use tampons until your health care provider approves.  Do not have sex until your health care provider approves. Tell your health care provider if you have pain or numbness when you return to sexual activity.  Wear comfortable, loose-fitting clothing. Driving  Do not drive or operate heavy machinery while taking prescription pain medicine.  Do not drive for 24 hours if you received a medicine to help you relax (sedative). General instructions   Take over-the-counter and prescription medicines only as told by your health care provider.  Take a stool softener to help prevent constipation as told by your  health care provider. You may need to do this if you are taking prescription pain medicine. °· Drink enough fluid to keep your urine clear or pale yellow. °· If you were sent home with a drain, take care of it as told by your health care provider. °· Wear compression stockings as told by your health care provider. These stockings help to prevent blood clots and reduce swelling in your legs. °· Keep all follow-up visits as told by your health care provider. This is important. °Contact a health care provider  if: °· You have more redness, swelling, or pain around your incision. °· You have more fluid or blood coming from your incision. °· Your incision feels warm to the touch. °· You have pus or a bad smell coming from your incision. °· You have a fever. °· You have painful or bloody urination. °· You feel nauseous or you vomit. °· You develop diarrhea. °· You develop constipation. °· You develop a rash. °· You feel dizzy or light-headed. °· You have pain that does not get better with medicine. °· Your incision breaks open. °Get help right away if: °· You faint. °· You develop leg or chest pain. °· You develop abdominal pain. °· You develop shortness of breath. °This information is not intended to replace advice given to you by your health care provider. Make sure you discuss any questions you have with your health care provider. °Document Released: 12/31/2003 Document Revised: 10/24/2015 Document Reviewed: 05/13/2015 °Elsevier Interactive Patient Education © 2018 [employer] ° °

## 2018-03-30 NOTE — Anesthesia Preprocedure Evaluation (Signed)
Anesthesia Evaluation  Patient identified by MRN, date of birth, ID band Patient awake    Reviewed: Allergy & Precautions, NPO status , Patient's Chart, lab work & pertinent test results  Airway Mallampati: I  TM Distance: >3 FB Neck ROM: Full   Comment: Scar from prior surg on neck, RT changes  Dental no notable dental hx. (+) Teeth Intact Lower bridge :   Pulmonary neg pulmonary ROS, former smoker,  H/o larygeal ca treated with CT/RT last TX was in2014 Slightly hoarse  Has had surgery after this without issue  Airway appears midline on exam    Pulmonary exam normal breath sounds clear to auscultation       Cardiovascular Exercise Tolerance: Good hypertension, Pt. on medications negative cardio ROS Normal cardiovascular examI Rhythm:Regular Rate:Normal     Neuro/Psych Anxiety Depression negative neurological ROS  negative psych ROS   GI/Hepatic negative GI ROS, Neg liver ROS,   Endo/Other  Hypothyroidism   Renal/GU negative Renal ROS  negative genitourinary   Musculoskeletal negative musculoskeletal ROS (+)   Abdominal   Peds negative pediatric ROS (+)  Hematology negative hematology ROS (+)   Anesthesia Other Findings   Reproductive/Obstetrics negative OB ROS                             Anesthesia Physical Anesthesia Plan  ASA: III  Anesthesia Plan: General   Post-op Pain Management:    Induction: Intravenous  PONV Risk Score and Plan:   Airway Management Planned: LMA and Oral ETT  Additional Equipment:   Intra-op Plan:   Post-operative Plan: Extubation in OR  Informed Consent: I have reviewed the patients History and Physical, chart, labs and discussed the procedure including the risks, benefits and alternatives for the proposed anesthesia with the patient or authorized representative who has indicated his/her understanding and acceptance.   Dental advisory  given  Plan Discussed with: CRNA  Anesthesia Plan Comments: (LMA vs ETT planned )        Anesthesia Quick Evaluation

## 2018-03-30 NOTE — Anesthesia Procedure Notes (Signed)
Procedure Name: Intubation Date/Time: 03/30/2018 12:13 PM Performed by: Andree Elk, Nicco Reaume A, CRNA Pre-anesthesia Checklist: Patient identified, Patient being monitored, Timeout performed, Emergency Drugs available and Suction available Patient Re-evaluated:Patient Re-evaluated prior to induction Oxygen Delivery Method: Circle system utilized Preoxygenation: Pre-oxygenation with 100% oxygen Induction Type: IV induction Ventilation: Mask ventilation without difficulty Laryngoscope Size: 3 and Glidescope Grade View: Grade I Tube type: Oral Tube size: 7.0 mm Number of attempts: 1 Airway Equipment and Method: Stylet Placement Confirmation: ETT inserted through vocal cords under direct vision,  positive ETCO2 and breath sounds checked- equal and bilateral Secured at: 21 cm Tube secured with: Tape Dental Injury: Teeth and Oropharynx as per pre-operative assessment

## 2018-03-30 NOTE — Anesthesia Postprocedure Evaluation (Signed)
Anesthesia Post Note Late Entry for 1329  Patient: Allegra Lai  Procedure(s) Performed: LASER PARTIAL VULVECTOMY FOR VULVUL INTRAEPITHELIA NEOPLASIS III (Bilateral )  Patient location during evaluation: PACU Anesthesia Type: General Level of consciousness: awake and alert and oriented Pain management: pain level controlled Vital Signs Assessment: post-procedure vital signs reviewed and stable Respiratory status: spontaneous breathing Cardiovascular status: stable Postop Assessment: no apparent nausea or vomiting Anesthetic complications: no     Last Vitals:  Vitals:   03/30/18 1315 03/30/18 1330  BP: 118/77 (!) 131/93  Pulse: 87 94  Resp: 16 15  Temp:    SpO2: 100%     Last Pain:  Vitals:   03/30/18 1330  TempSrc:   PainSc: 0-No pain                 Akul Leggette A

## 2018-03-30 NOTE — H&P (Signed)
Preoperative History and Physical  [name] is a 62 y.o. No obstetric history on file. with No LMP recorded. Patient has had a hysterectomy. admitted for a laser partial vulvectomy for VIN III.  Pt referred after biopsy revelas VIN III Evaluation in office confimrs multifocal lesions  PMH:    Past Medical History:  Diagnosis Date  . Allergy    sulfa  . Anxiety 03/09/2012  . 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 Chronic hoarseness    years  . Dysphagia   . Hypertension   . Hypertriglyceridemia     PSH:     Past Surgical History:  Procedure Laterality Date  . COLON RESECTION    . KNEE SURGERY    . oral surgery tooth extraction    . TONSILLECTOMY    . VAGINAL HYSTERECTOMY      POb/GynH:      OB History   None     SH:   Social History   Tobacco Use  . Smoking status: Former Smoker    Packs/day: 0.25    Years: 40.00    Pack years: 10.00    Types: Cigarettes  . Smokeless tobacco: Never Used  Substance Use Topics  . Alcohol use: Yes  . Drug use: No    FH:    Family History  Problem Relation Age of Onset  . Cancer Mother 38       breast   . Heart attack Father   . Heart failure Father   . Cancer Maternal Aunt 40       pancreas  . Cancer Maternal Grandfather        lung     Allergies:  Allergies  Allergen Reactions  . Azithromycin Diarrhea  . Sulfa Antibiotics Other (See Comments)    Unable to recall    Medications:       Current Facility-Administered Medications:  .  ceFAZolin (ANCEF) IVPB 2g/100 mL premix, 2 g, Intravenous, On Call to OR, Florian Buff, MD .  lactated ringers infusion, , Intravenous, Continuous, Hilaria Ota, Talbert Forest, MD, Last Rate: 50 mL/hr at 03/30/18 1056  Review of Systems:   Review of Systems  Constitutional: Negative for fever, chills, weight loss, malaise/fatigue and diaphoresis.  HENT: Negative for hearing loss, ear pain, nosebleeds,  congestion, sore throat, neck pain, tinnitus and ear discharge.   Eyes: Negative for blurred vision, double vision, photophobia, pain, discharge and redness.  Respiratory: Negative for cough, hemoptysis, sputum production, shortness of breath, wheezing and stridor.   Cardiovascular: Negative for chest pain, palpitations, orthopnea, claudication, leg swelling and PND.  Gastrointestinal: Positive for abdominal pain. Negative for heartburn, nausea, vomiting, diarrhea, constipation, blood in stool and melena.  Genitourinary: Negative for dysuria, urgency, frequency, hematuria and flank pain.  Musculoskeletal: Negative for myalgias, back pain, joint pain and falls.  Skin: Negative for itching and rash.  Neurological: Negative for dizziness, tingling, tremors, sensory change, speech change, focal weakness, seizures, loss of consciousness, weakness and headaches.  Endo/Heme/Allergies: Negative for environmental allergies and polydipsia. Does not bruise/bleed easily.  Psychiatric/Behavioral: Negative for depression, suicidal ideas, hallucinations, memory loss and substance abuse. The patient is not nervous/anxious and does not have insomnia.      PHYSICAL EXAM:  Blood pressure 125/76, pulse 74, temperature 98.1 F (36.7 C), temperature source Oral, resp. rate 18, height 5\' 2"  (1.575 m), weight 97.5 kg, SpO2 97 %.    Vitals reviewed. Constitutional: She is oriented to person,  place, and time. She appears well-developed and well-nourished.  HENT:  Head: Normocephalic and atraumatic.  Right Ear: External ear normal.  Left Ear: External ear normal.  Nose: Nose normal.  Mouth/Throat: Oropharynx is clear and moist.  Eyes: Conjunctivae and EOM are normal. Pupils are equal, round, and reactive to light. Right eye exhibits no discharge. Left eye exhibits no discharge. No scleral icterus.  Neck: Normal range of motion. Neck supple. No tracheal deviation present. No thyromegaly present.  Cardiovascular:  Normal rate, regular rhythm, normal heart sounds and intact distal pulses.  Exam reveals no gallop and no friction rub.   No murmur heard. Respiratory: Effort normal and breath sounds normal. No respiratory distress. She has no wheezes. She has no rales. She exhibits no tenderness.  GI: Soft. Bowel sounds are normal. She exhibits no distension and no mass. There is tenderness. There is no rebound and no guarding.  Genitourinary:       Vulva is with areas of leukoplakia consistent with VIN III Vagina is pink moist without discharge Cervix absent Uterus is uterus absent Adnexa is negative with normal sized ovaries by sonogram  Musculoskeletal: Normal range of motion. She exhibits no edema and no tenderness.  Neurological: She is alert and oriented to person, place, and time. She has normal reflexes. She displays normal reflexes. No cranial nerve deficit. She exhibits normal muscle tone. Coordination normal.  Skin: Skin is warm and dry. No rash noted. No erythema. No pallor.  Psychiatric: She has a normal mood and affect. Her behavior is normal. Judgment and thought content normal.    Labs: Results for orders placed or performed during the hospital encounter of 03/24/18 (from the past 336 hour(s))  CBC   Collection Time: 03/24/18 12:31 PM  Result Value Ref Range   WBC 7.3 4.0 - 10.5 K/uL   RBC 4.50 3.87 - 5.11 MIL/uL   Hemoglobin 13.1 12.0 - 15.0 g/dL   HCT 41.0 36.0 - 46.0 %   MCV 91.1 80.0 - 100.0 fL   MCH 29.1 26.0 - 34.0 pg   MCHC 32.0 30.0 - 36.0 g/dL   RDW 13.5 11.5 - 15.5 %   Platelets 254 150 - 400 K/uL   nRBC 0.0 0.0 - 0.2 %  Comprehensive metabolic panel   Collection Time: 03/24/18 12:31 PM  Result Value Ref Range   Sodium 140 135 - 145 mmol/L   Potassium 4.1 3.5 - 5.1 mmol/L   Chloride 106 98 - 111 mmol/L   CO2 24 22 - 32 mmol/L   Glucose, Bld 119 (H) 70 - 99 mg/dL   BUN 16 8 - 23 mg/dL   Creatinine, Ser 1.04 (H) 0.44 - 1.00 mg/dL   Calcium 9.3 8.9 - 10.3 mg/dL    Total Protein 7.4 6.5 - 8.1 g/dL   Albumin 4.4 3.5 - 5.0 g/dL   AST 19 15 - 41 U/L   ALT 28 0 - 44 U/L   Alkaline Phosphatase 43 38 - 126 U/L   Total Bilirubin 0.7 0.3 - 1.2 mg/dL   GFR calc non Af Amer 56 (L) >60 mL/min   GFR calc Af Amer >60 >60 mL/min   Anion gap 10 5 - 15  Rapid HIV screen (HIV 1/2 Ab+Ag)   Collection Time: 03/24/18 12:31 PM  Result Value Ref Range   HIV-1 P24 Antigen - HIV24 NON REACTIVE NON REACTIVE   HIV 1/2 Antibodies NON REACTIVE NON REACTIVE   Interpretation (HIV Ag Ab)      A  non reactive test result means that HIV 1 or HIV 2 antibodies and HIV 1 p24 antigen were not detected in the specimen.  Urinalysis, Routine w reflex microscopic   Collection Time: 03/24/18 12:31 PM  Result Value Ref Range   Color, Urine YELLOW YELLOW   APPearance CLEAR CLEAR   Specific Gravity, Urine 1.016 1.005 - 1.030   pH 6.0 5.0 - 8.0   Glucose, UA NEGATIVE NEGATIVE mg/dL   Hgb urine dipstick NEGATIVE NEGATIVE   Bilirubin Urine NEGATIVE NEGATIVE   Ketones, ur NEGATIVE NEGATIVE mg/dL   Protein, ur NEGATIVE NEGATIVE mg/dL   Nitrite NEGATIVE NEGATIVE   Leukocytes, UA TRACE (A) NEGATIVE   RBC / HPF 0-5 0 - 5 RBC/hpf   WBC, UA 0-5 0 - 5 WBC/hpf   Bacteria, UA NONE SEEN NONE SEEN   Squamous Epithelial / LPF 0-5 0 - 5   Mucus PRESENT     EKG: Orders placed or performed during the hospital encounter of 03/24/18  . EKG 12-Lead  . EKG 12-Lead    Imaging Studies: No results found.    Assessment: VIN III,multifocal   Patient Active Problem List   Diagnosis Date Noted  . Acquired hypothyroidism 01/15/2017  . BMI 33.0-33.9,adult 05/30/2015  . Hyperlipidemia with target LDL less than 100 12/13/2014  . Essential hypertension 05/28/2014  . Depression 09/18/2013  . Panic attacks 09/18/2013  . Insomnia 09/18/2013  . Anxiety 03/09/2012    Plan: Laser partial vulvectomy for treatment of multifocal VIN III  Florian Buff 03/30/2018 11:35 AM

## 2018-03-31 ENCOUNTER — Encounter (HOSPITAL_COMMUNITY): Payer: Self-pay | Admitting: Obstetrics & Gynecology

## 2018-04-04 ENCOUNTER — Encounter: Payer: Self-pay | Admitting: Pediatrics

## 2018-04-07 ENCOUNTER — Encounter: Payer: Self-pay | Admitting: Obstetrics & Gynecology

## 2018-04-07 ENCOUNTER — Ambulatory Visit (INDEPENDENT_AMBULATORY_CARE_PROVIDER_SITE_OTHER): Payer: Medicare HMO | Admitting: Obstetrics & Gynecology

## 2018-04-07 VITALS — BP 139/86 | HR 85 | Wt 211.0 lb

## 2018-04-07 DIAGNOSIS — Z9889 Other specified postprocedural states: Secondary | ICD-10-CM

## 2018-04-07 NOTE — Progress Notes (Signed)
  HPI: Patient returns for routine postoperative follow-up having undergone laser partial vulvectomy on 03/30/2018.  The patient's immediate postoperative recovery has been unremarkable. Since hospital discharge the patient reports doing well.   Current Outpatient Medications: ALPRAZolam (XANAX) 0.5 MG tablet, Take 1 tablet (0.5 mg total) by mouth 2 (two) times daily., Disp: 60 tablet, Rfl: 3 fenofibrate (TRICOR) 145 MG tablet, Take 1 tablet (145 mg total) by mouth daily., Disp: 90 tablet, Rfl: 1 levothyroxine (SYNTHROID) 75 MCG tablet, TAKE 1 TABLET DAILY BEFORE BREAKFAST DOSE CHANGE (Patient taking differently: Take 75 mcg by mouth daily before breakfast. ), Disp: 90 tablet, Rfl: 1 losartan (COZAAR) 100 MG tablet, Take 1 tablet (100 mg total) by mouth daily., Disp: 90 tablet, Rfl: 1 Lysine 500 MG CAPS, Take 500 mg by mouth 2 (two) times a week. , Disp: , Rfl:  silver sulfADIAZINE (SILVADENE) 1 % cream, Apply 3-4 times daily, Disp: 50 g, Rfl: 11 trimethoprim-polymyxin b (POLYTRIM) ophthalmic solution, Place 1 drop into both eyes every 4 (four) hours. Give enough for 7 days, Disp: 10 mL, Rfl: 0 zolpidem (AMBIEN) 5 MG tablet, Take 1-2 tablets (5-10 mg total) by mouth at bedtime as needed. (Patient taking differently: Take 10 mg by mouth at bedtime. ), Disp: 60 tablet, Rfl: 3 albuterol (PROVENTIL HFA;VENTOLIN HFA) 108 (90 Base) MCG/ACT inhaler, Inhale 2 puffs into the lungs every 6 (six) hours as needed for wheezing or shortness of breath. (Patient not taking: Reported on 04/07/2018), Disp: 1 Inhaler, Rfl: 0 Apple Cid Vn-Grn Tea-Bit Or-Cr (APPLE CIDER VINEGAR PLUS) TABS, Take 1 tablet by mouth daily. , Disp: , Rfl:  Coenzyme Q10 100 MG capsule, Take 100 mg by mouth daily. , Disp: , Rfl:  Flaxseed, Linseed, (FLAXSEED OIL) 1000 MG CAPS, Take 1,000 mg by mouth daily. , Disp: , Rfl:  HAWTHORNE BERRY PO, Take 565 mg by mouth daily. , Disp: , Rfl:  INOSITOL PO, Take 1 capsule by mouth daily., Disp: , Rfl:   ketorolac (TORADOL) 10 MG tablet, Take 1 tablet (10 mg total) by mouth every 8 (eight) hours as needed. (Patient not taking: Reported on 04/07/2018), Disp: 15 tablet, Rfl: 0 Milk Thistle 250 MG CAPS, Take 250 mg by mouth daily. , Disp: , Rfl:  Omega-3 Fatty Acids (FISH OIL) 1000 MG CAPS, Take 1,000 mg by mouth daily. , Disp: , Rfl:  ondansetron (ZOFRAN ODT) 8 MG disintegrating tablet, Take 1 tablet (8 mg total) by mouth every 8 (eight) hours as needed for nausea or vomiting. (Patient not taking: Reported on 04/07/2018), Disp: 20 tablet, Rfl: 0 OVER THE COUNTER MEDICATION, Take 1 capsule by mouth 2 (two) times daily. Liva Plex Supplement, Disp: , Rfl:  OVER THE COUNTER MEDICATION, Take 3 tablets by mouth 3 (three) times daily. Cata Plex Supplement, Disp: , Rfl:  oxyCODONE-acetaminophen (PERCOCET) 5-325 MG tablet, Take 1-2 tablets by mouth every 4 (four) hours as needed for severe pain. (Patient not taking: Reported on 04/07/2018), Disp: 30 tablet, Rfl: 0 SUPER B COMPLEX/C PO, Take 1 capsule by mouth daily., Disp: , Rfl:   No current facility-administered medications for this visit.     Blood pressure 139/86, pulse 85, weight 211 lb (95.7 kg).  Physical Exam: Vulva with appropriate post laser healing No evidence of infection  Diagnostic Tests:   Pathology: VIN III  Impression: S/p laser vulvectomy, partial, for the treatment of VIN III  Plan: Continue local care with silvadene  Follow up: 3  weeks  Florian Buff, MD

## 2018-04-08 ENCOUNTER — Telehealth: Payer: Self-pay | Admitting: Obstetrics & Gynecology

## 2018-04-08 NOTE — Telephone Encounter (Signed)
Called patient back and she had some bleeding yesterday after her visit yesterday. It has stopped completely now. Advised patient to continue monitoring and if bleeding returns she needs to be seen earlier than her next visit.

## 2018-04-08 NOTE — Telephone Encounter (Signed)
Patient called stating that she called the after hour nurse because she started bleeding all down her leg and on the floor. The after hours nurse told her to wear a pad and if she soaks up two pads with an hour to go to the ER. Pt states that she didn't and she is barely bleeding now but she wanted to let us know. Please contact pt

## 2018-04-19 ENCOUNTER — Encounter: Payer: Self-pay | Admitting: Nurse Practitioner

## 2018-04-19 ENCOUNTER — Telehealth: Payer: Self-pay | Admitting: Obstetrics & Gynecology

## 2018-04-19 ENCOUNTER — Ambulatory Visit (INDEPENDENT_AMBULATORY_CARE_PROVIDER_SITE_OTHER): Payer: Medicare HMO | Admitting: Nurse Practitioner

## 2018-04-19 ENCOUNTER — Telehealth: Payer: Self-pay | Admitting: Nurse Practitioner

## 2018-04-19 VITALS — BP 161/89 | HR 77 | Temp 97.0°F | Ht 62.0 in | Wt 214.0 lb

## 2018-04-19 DIAGNOSIS — Z6833 Body mass index (BMI) 33.0-33.9, adult: Secondary | ICD-10-CM | POA: Diagnosis not present

## 2018-04-19 DIAGNOSIS — F410 Panic disorder [episodic paroxysmal anxiety] without agoraphobia: Secondary | ICD-10-CM

## 2018-04-19 DIAGNOSIS — F3341 Major depressive disorder, recurrent, in partial remission: Secondary | ICD-10-CM | POA: Diagnosis not present

## 2018-04-19 DIAGNOSIS — F5101 Primary insomnia: Secondary | ICD-10-CM | POA: Diagnosis not present

## 2018-04-19 DIAGNOSIS — Z23 Encounter for immunization: Secondary | ICD-10-CM

## 2018-04-19 DIAGNOSIS — E785 Hyperlipidemia, unspecified: Secondary | ICD-10-CM

## 2018-04-19 DIAGNOSIS — F419 Anxiety disorder, unspecified: Secondary | ICD-10-CM | POA: Diagnosis not present

## 2018-04-19 DIAGNOSIS — E039 Hypothyroidism, unspecified: Secondary | ICD-10-CM | POA: Diagnosis not present

## 2018-04-19 DIAGNOSIS — I10 Essential (primary) hypertension: Secondary | ICD-10-CM

## 2018-04-19 DIAGNOSIS — T8149XA Infection following a procedure, other surgical site, initial encounter: Secondary | ICD-10-CM

## 2018-04-19 MED ORDER — DOXYCYCLINE HYCLATE 100 MG PO TABS: 100.0000 mg | ORAL_TABLET | Freq: Two times a day (BID) | ORAL | 0 refills | Status: DC

## 2018-04-19 MED ORDER — LOSARTAN POTASSIUM 100 MG PO TABS: 100.0000 mg | ORAL_TABLET | Freq: Every day | ORAL | 1 refills | Status: DC

## 2018-04-19 MED ORDER — ALPRAZOLAM 0.5 MG PO TABS: 0.5000 mg | ORAL_TABLET | Freq: Two times a day (BID) | ORAL | 1 refills | Status: DC

## 2018-04-19 MED ORDER — FENOFIBRATE 160 MG PO TABS: 160.0000 mg | ORAL_TABLET | Freq: Every day | ORAL | 1 refills | Status: DC

## 2018-04-19 MED ORDER — ZOLPIDEM TARTRATE 5 MG PO TABS: 10.0000 mg | ORAL_TABLET | Freq: Every day | ORAL | 1 refills | Status: DC

## 2018-04-19 MED ORDER — LEVOTHYROXINE SODIUM 75 MCG PO TABS: 75.0000 ug | ORAL_TABLET | Freq: Every day | ORAL | 1 refills | Status: DC

## 2018-04-19 NOTE — Progress Notes (Signed)
Subjective:    Patient ID: [name], female    DOB: Aug 07, 1955, 62 y.o.   MRN: [id number]   Chief Complaint: Annual Exam   HPI:  1. Essential hypertension  -no c/o CP/HA/SOB -does not watch salt in diet -does not check BP at home but says BP is normal at home BP Readings from Last 3 Encounters:  04/19/18 (!) 161/89  04/07/18 139/86  03/30/18 133/73    2. Hyperlipidemia with target LDL less than 100  -does not watch diet -does not take a statin, only Tricor -refuses statins  3. Recurrent major depressive disorder, in partial remission ([place])  -stays depressed -refuses antidepressants  4. Acquired hypothyroidism  -no issues she is aware of  5. Anxiety  -xanax BID Depression screen [facility] 2/9 04/19/2018 03/21/2018 01/18/2018 10/07/2017 04/19/2017  Decreased Interest 1 1 1 1 2   Down, Depressed, Hopeless 1 2 1 2 3   PHQ - 2 Score 2 3 2 3 5   Altered sleeping 3 3 3 3 3   Tired, decreased energy 3 1 1 2 2   Change in appetite 0 0 1 2 3   Feeling bad or failure about yourself  0 0 0 0 0  Trouble concentrating 1 1 0 1 2  Moving slowly or fidgety/restless 0 0 0 0 0  Suicidal thoughts 0 0 0 0 0  PHQ-9 Score 9 8 7 11 15   Some recent data might be hidden    6. Panic attacks  -no panic attacks recently, xanax BID  7. Primary insomnia  -sleeping OK, but takes Ambien intermittently  8. BMI 33.0-33.9,adult  -no dedicated exercise routine    Outpatient Encounter Medications as of 04/19/2018  Medication Sig  . albuterol (PROVENTIL HFA;VENTOLIN HFA) 108 (90 Base) MCG/ACT inhaler Inhale 2 puffs into the lungs every 6 (six) hours as needed for wheezing or shortness of breath. (Patient not taking: Reported on 04/07/2018)  . ALPRAZolam (XANAX) 0.5 MG tablet Take 1 tablet (0.5 mg total) by mouth 2 (two) times daily.  Marland Kitchen Apple Cid Vn-Grn Tea-Bit Or-Cr (APPLE CIDER VINEGAR PLUS) TABS Take 1 tablet by mouth daily.   . Coenzyme Q10 100 MG capsule Take 100 mg by mouth daily.   . fenofibrate  (TRICOR) 145 MG tablet Take 1 tablet (145 mg total) by mouth daily.  . Flaxseed, Linseed, (FLAXSEED OIL) 1000 MG CAPS Take 1,000 mg by mouth daily.   Marland Kitchen HAWTHORNE BERRY PO Take 565 mg by mouth daily.   . INOSITOL PO Take 1 capsule by mouth daily.  Marland Kitchen ketorolac (TORADOL) 10 MG tablet Take 1 tablet (10 mg total) by mouth every 8 (eight) hours as needed. (Patient not taking: Reported on 04/07/2018)  . levothyroxine (SYNTHROID) 75 MCG tablet TAKE 1 TABLET DAILY BEFORE BREAKFAST DOSE CHANGE (Patient taking differently: Take 75 mcg by mouth daily before breakfast. )  . losartan (COZAAR) 100 MG tablet Take 1 tablet (100 mg total) by mouth daily.  Marland Kitchen Lysine 500 MG CAPS Take 500 mg by mouth 2 (two) times a week.   . Milk Thistle 250 MG CAPS Take 250 mg by mouth daily.   . Omega-3 Fatty Acids (FISH OIL) 1000 MG CAPS Take 1,000 mg by mouth daily.   . ondansetron (ZOFRAN ODT) 8 MG disintegrating tablet Take 1 tablet (8 mg total) by mouth every 8 (eight) hours as needed for nausea or vomiting. (Patient not taking: Reported on 04/07/2018)  . OVER THE COUNTER MEDICATION Take 1 capsule by mouth 2 (two) times daily.  Liva Plex Supplement  . OVER THE COUNTER MEDICATION Take 3 tablets by mouth 3 (three) times daily. Cata Plex Supplement  . oxyCODONE-acetaminophen (PERCOCET) 5-325 MG tablet Take 1-2 tablets by mouth every 4 (four) hours as needed for severe pain. (Patient not taking: Reported on 04/07/2018)  . silver sulfADIAZINE (SILVADENE) 1 % cream Apply 3-4 times daily  . SUPER B COMPLEX/C PO Take 1 capsule by mouth daily.  Marland Kitchen trimethoprim-polymyxin b (POLYTRIM) ophthalmic solution Place 1 drop into both eyes every 4 (four) hours. Give enough for 7 days  . zolpidem (AMBIEN) 5 MG tablet Take 1-2 tablets (5-10 mg total) by mouth at bedtime as needed. (Patient taking differently: Take 10 mg by mouth at bedtime. )   No facility-administered encounter medications on file as of 04/19/2018.       New complaints: None  today, s/p partial vulvectomy  Social history: Married, retired    Review of Systems  Constitutional: Negative for activity change, appetite change, chills, fatigue, fever and unexpected weight change.  HENT: Negative for congestion, ear pain, rhinorrhea, sinus pressure, sinus pain and sore throat.   Eyes: Negative for pain, redness and visual disturbance.  Respiratory: Negative for cough, chest tightness, shortness of breath and wheezing.   Cardiovascular: Negative for chest pain, palpitations and leg swelling.  Gastrointestinal: Negative for abdominal pain, constipation, diarrhea, nausea and vomiting.  Endocrine: Negative for cold intolerance, heat intolerance, polydipsia, polyphagia and polyuria.  Genitourinary: Negative for difficulty urinating, dysuria and urgency.  Musculoskeletal: Negative for arthralgias, gait problem, joint swelling and myalgias.  Skin: Negative for rash and wound.  Allergic/Immunologic: Negative for environmental allergies and food allergies.  Neurological: Negative for dizziness, tremors, weakness and numbness.  Hematological: Does not bruise/bleed easily.  Psychiatric/Behavioral: Positive for sleep disturbance. Negative for behavioral problems, confusion, decreased concentration and suicidal ideas. The patient is not nervous/anxious.        Objective:   Physical Exam  Constitutional: She is oriented to person, place, and time. She appears well-developed and well-nourished.  HENT:  Head: Normocephalic and atraumatic.  Right Ear: External ear normal.  Left Ear: External ear normal.  Nose: Nose normal.  Mouth/Throat: Oropharynx is clear and moist. No oropharyngeal exudate.  Eyes: Pupils are equal, round, and reactive to light. Conjunctivae and EOM are normal.  Neck: Normal range of motion. Neck supple. Edema (BLE +1) present. No thyromegaly present.  Cardiovascular: Normal rate, regular rhythm, normal heart sounds and intact distal pulses.    Pulmonary/Chest: Effort normal and breath sounds normal.  Abdominal: Soft. Bowel sounds are normal. There is no tenderness. A hernia is present. Hernia confirmed positive in the ventral area.  Genitourinary:     Genitourinary Comments: Moderate purulent discharge present from excision site  Musculoskeletal: Normal range of motion.  Neurological: She is alert and oriented to person, place, and time. She has normal strength. She displays normal reflexes. No cranial nerve deficit.  Skin: Skin is warm and dry.  Psychiatric: Her speech is normal and behavior is normal. Judgment and thought content normal. Her mood appears anxious.  Nursing note and vitals reviewed.    BP (!) 161/89   Pulse 77   Temp (!) 97 F (36.1 C) (Oral)   Ht 5\' 2"  (1.575 m)   Wt 214 lb (97.1 kg)   BMI 39.14 kg/m      Assessment & Plan:  [name] comes in today with chief complaint of Annual Exam   Diagnosis and orders addressed:  1. Essential hypertension -watch  salt intake -check BP at home - losartan (COZAAR) 100 MG tablet; Take 1 tablet (100 mg total) by mouth daily.  Dispense: 90 tablet; Refill: 1  2. Hyperlipidemia with target LDL less than 100 - Lipid panel -low fat diet -refusing Statin therapy  3. Recurrent major depressive disorder, in partial remission ([place]) -stress management  4. Acquired hypothyroidism - levothyroxine (SYNTHROID) 75 MCG tablet; Take 1 tablet (75 mcg total) by mouth daily before breakfast.  Dispense: 90 tablet; Refill: 1  5. Anxiety -stress management - ALPRAZolam (XANAX) 0.5 MG tablet; Take 1 tablet (0.5 mg total) by mouth 2 (two) times daily.  Dispense: 180 tablet; Refill: 1  6. Panic attacks -stress management  7. Primary insomnia -bedtime routine -sleep hygiene - zolpidem (AMBIEN) 5 MG tablet; Take 2 tablets (10 mg total) by mouth at bedtime.  Dispense: 90 tablet; Refill: 1  8. BMI 33.0-33.9,adult -encouraged dietary changes and exercise routine  9.  Surgical wound infection -keep area clean and dry -change pad when soiled and as needed -wash with unscented, mild soap - doxycycline (VIBRA-TABS) 100 MG tablet; Take 1 tablet (100 mg total) by mouth 2 (two) times daily. 1 po bid  Dispense: 20 tablet; Refill: 0 -Inform OB/GYN of new discharge & keep follow-up with OB/GYN  Labs pending Health Maintenance reviewed Diet and exercise encouraged  Follow up plan: 3 months   [place], FNP

## 2018-04-19 NOTE — Telephone Encounter (Signed)
Patient informed per Dr Elonda Husky "It always looks bad from that surgery it is not infected, there is no need to take the antibiotic, that is the way every laser of the vulva looks." pt verbalized understanding and will not take antibiotic.

## 2018-04-19 NOTE — Telephone Encounter (Signed)
Patient had laser of the Vulva- patient seen MMM today and states that she looked at the area and states it was infected since it was green and was given abx.  Patient states she called Dr. Brynda Greathouse office and was told it was normal and not to take abx.  Everything is in epic.

## 2018-04-19 NOTE — Telephone Encounter (Signed)
Patient called stating that she went to her PCP and had her physical done today. Pt states that her PCP stated that her surgery site looked a little infected and called her in an antibiotic. Pt wants to know if that is okay. Please contact pt

## 2018-04-19 NOTE — Telephone Encounter (Signed)
It always looks bad from that surgery it is not infected, there is no need to take the antibiotic, that is the way every laser of the vulva looks

## 2018-04-27 ENCOUNTER — Other Ambulatory Visit: Payer: Self-pay

## 2018-04-27 ENCOUNTER — Encounter: Payer: Self-pay | Admitting: Obstetrics & Gynecology

## 2018-04-27 ENCOUNTER — Ambulatory Visit (INDEPENDENT_AMBULATORY_CARE_PROVIDER_SITE_OTHER): Payer: Medicare HMO | Admitting: Obstetrics & Gynecology

## 2018-04-27 VITALS — BP 152/87 | HR 91 | Ht 62.5 in | Wt 215.0 lb

## 2018-04-27 DIAGNOSIS — Z9889 Other specified postprocedural states: Secondary | ICD-10-CM

## 2018-04-27 DIAGNOSIS — D071 Carcinoma in situ of vulva: Secondary | ICD-10-CM

## 2018-04-27 NOTE — Progress Notes (Signed)
  HPI: Patient returns for routine postoperative follow-up having undergone laser partial vulvectomy on 03/30/2018.  The patient's immediate postoperative recovery has been unremarkable. Since hospital discharge the patient reports doing well.   Current Outpatient Medications: ALPRAZolam (XANAX) 0.5 MG tablet, Take 1 tablet (0.5 mg total) by mouth 2 (two) times daily., Disp: 60 tablet, Rfl: 3 fenofibrate (TRICOR) 145 MG tablet, Take 1 tablet (145 mg total) by mouth daily., Disp: 90 tablet, Rfl: 1 levothyroxine (SYNTHROID) 75 MCG tablet, TAKE 1 TABLET DAILY BEFORE BREAKFAST DOSE CHANGE (Patient taking differently: Take 75 mcg by mouth daily before breakfast. ), Disp: 90 tablet, Rfl: 1 losartan (COZAAR) 100 MG tablet, Take 1 tablet (100 mg total) by mouth daily., Disp: 90 tablet, Rfl: 1 Lysine 500 MG CAPS, Take 500 mg by mouth 2 (two) times a week. , Disp: , Rfl:  silver sulfADIAZINE (SILVADENE) 1 % cream, Apply 3-4 times daily, Disp: 50 g, Rfl: 11 trimethoprim-polymyxin b (POLYTRIM) ophthalmic solution, Place 1 drop into both eyes every 4 (four) hours. Give enough for 7 days, Disp: 10 mL, Rfl: 0 zolpidem (AMBIEN) 5 MG tablet, Take 1-2 tablets (5-10 mg total) by mouth at bedtime as needed. (Patient taking differently: Take 10 mg by mouth at bedtime. ), Disp: 60 tablet, Rfl: 3 albuterol (PROVENTIL HFA;VENTOLIN HFA) 108 (90 Base) MCG/ACT inhaler, Inhale 2 puffs into the lungs every 6 (six) hours as needed for wheezing or shortness of breath. (Patient not taking: Reported on 04/07/2018), Disp: 1 Inhaler, Rfl: 0 Apple Cid Vn-Grn Tea-Bit Or-Cr (APPLE CIDER VINEGAR PLUS) TABS, Take 1 tablet by mouth daily. , Disp: , Rfl:  Coenzyme Q10 100 MG capsule, Take 100 mg by mouth daily. , Disp: , Rfl:  Flaxseed, Linseed, (FLAXSEED OIL) 1000 MG CAPS, Take 1,000 mg by mouth daily. , Disp: , Rfl:  HAWTHORNE BERRY PO, Take 565 mg by mouth daily. , Disp: , Rfl:  INOSITOL PO, Take 1 capsule by mouth daily., Disp: , Rfl:   ketorolac (TORADOL) 10 MG tablet, Take 1 tablet (10 mg total) by mouth every 8 (eight) hours as needed. (Patient not taking: Reported on 04/07/2018), Disp: 15 tablet, Rfl: 0 Milk Thistle 250 MG CAPS, Take 250 mg by mouth daily. , Disp: , Rfl:  Omega-3 Fatty Acids (FISH OIL) 1000 MG CAPS, Take 1,000 mg by mouth daily. , Disp: , Rfl:  ondansetron (ZOFRAN ODT) 8 MG disintegrating tablet, Take 1 tablet (8 mg total) by mouth every 8 (eight) hours as needed for nausea or vomiting. (Patient not taking: Reported on 04/07/2018), Disp: 20 tablet, Rfl: 0 OVER THE COUNTER MEDICATION, Take 1 capsule by mouth 2 (two) times daily. Liva Plex Supplement, Disp: , Rfl:  OVER THE COUNTER MEDICATION, Take 3 tablets by mouth 3 (three) times daily. Cata Plex Supplement, Disp: , Rfl:  oxyCODONE-acetaminophen (PERCOCET) 5-325 MG tablet, Take 1-2 tablets by mouth every 4 (four) hours as needed for severe pain. (Patient not taking: Reported on 04/07/2018), Disp: 30 tablet, Rfl: 0 SUPER B COMPLEX/C PO, Take 1 capsule by mouth daily., Disp: , Rfl:   No current facility-administered medications for this visit.     Blood pressure 139/86, pulse 85, weight 211 lb (95.7 kg).  Physical Exam: Vulva with appropriate post laser healing No evidence of infection  Diagnostic Tests:   Pathology: VIN III  Impression: S/p laser vulvectomy, partial, for the treatment of VIN III  Plan: Continue local care with silvadene  Follow up: 4  weeks  Florian Buff, MD

## 2018-05-30 ENCOUNTER — Ambulatory Visit (INDEPENDENT_AMBULATORY_CARE_PROVIDER_SITE_OTHER): Payer: Medicare HMO | Admitting: Obstetrics & Gynecology

## 2018-05-30 ENCOUNTER — Encounter: Payer: Self-pay | Admitting: Obstetrics & Gynecology

## 2018-05-30 VITALS — BP 168/91 | HR 87 | Ht 62.5 in | Wt 219.0 lb

## 2018-05-30 DIAGNOSIS — Z9889 Other specified postprocedural states: Secondary | ICD-10-CM

## 2018-05-30 DIAGNOSIS — D071 Carcinoma in situ of vulva: Secondary | ICD-10-CM

## 2018-05-30 NOTE — Progress Notes (Signed)
HPI: Patient returns for routine postoperative follow-up having undergone laser vaporization of VINIII on 03/30/2018.  The patient's immediate postoperative recovery has been unremarkable. Since hospital discharge the patient reports no problems.   Current Outpatient Medications: ALPRAZolam (XANAX) 0.5 MG tablet, Take 1 tablet (0.5 mg total) by mouth 2 (two) times daily., Disp: 180 tablet, Rfl: 1 Apple Cid Vn-Grn Tea-Bit Or-Cr (APPLE CIDER VINEGAR PLUS) TABS, Take 1 tablet by mouth daily. , Disp: , Rfl:  Coenzyme Q10 100 MG capsule, Take 100 mg by mouth daily. , Disp: , Rfl:  fenofibrate 160 MG tablet, Take 1 tablet (160 mg total) by mouth daily., Disp: 90 tablet, Rfl: 1 Flaxseed, Linseed, (FLAXSEED OIL) 1000 MG CAPS, Take 1,000 mg by mouth daily. , Disp: , Rfl:  HAWTHORNE BERRY PO, Take 565 mg by mouth daily. , Disp: , Rfl:  INOSITOL PO, Take 1 capsule by mouth daily., Disp: , Rfl:  levothyroxine (SYNTHROID) 75 MCG tablet, Take 1 tablet (75 mcg total) by mouth daily before breakfast., Disp: 90 tablet, Rfl: 1 losartan (COZAAR) 100 MG tablet, Take 1 tablet (100 mg total) by mouth daily., Disp: 90 tablet, Rfl: 1 Lysine 500 MG CAPS, Take 500 mg by mouth 2 (two) times a week. , Disp: , Rfl:  Milk Thistle 250 MG CAPS, Take 250 mg by mouth daily. , Disp: , Rfl:  Omega-3 Fatty Acids (FISH OIL) 1000 MG CAPS, Take 1,000 mg by mouth daily. , Disp: , Rfl:  OVER THE COUNTER MEDICATION, Take 1 capsule by mouth 2 (two) times daily. Liva Plex Supplement, Disp: , Rfl:  OVER THE COUNTER MEDICATION, Take 3 tablets by mouth 3 (three) times daily. Cata Plex Supplement, Disp: , Rfl:  silver sulfADIAZINE (SILVADENE) 1 % cream, Apply 3-4 times daily, Disp: 50 g, Rfl: 11 SUPER B COMPLEX/C PO, Take 1 capsule by mouth daily., Disp: , Rfl:  zolpidem (AMBIEN) 5 MG tablet, Take 2 tablets (10 mg total) by mouth at bedtime., Disp: 90 tablet, Rfl: 1 albuterol (PROVENTIL HFA;VENTOLIN HFA) 108 (90 Base) MCG/ACT inhaler,  Inhale 2 puffs into the lungs every 6 (six) hours as needed for wheezing or shortness of breath. (Patient not taking: Reported on 04/27/2018), Disp: 1 Inhaler, Rfl: 0 doxycycline (VIBRA-TABS) 100 MG tablet, Take 1 tablet (100 mg total) by mouth 2 (two) times daily. 1 po bid (Patient not taking: Reported on 04/27/2018), Disp: 20 tablet, Rfl: 0 ketorolac (TORADOL) 10 MG tablet, Take 1 tablet (10 mg total) by mouth every 8 (eight) hours as needed. (Patient not taking: Reported on 04/27/2018), Disp: 15 tablet, Rfl: 0 ondansetron (ZOFRAN ODT) 8 MG disintegrating tablet, Take 1 tablet (8 mg total) by mouth every 8 (eight) hours as needed for nausea or vomiting. (Patient not taking: Reported on 04/27/2018), Disp: 20 tablet, Rfl: 0 oxyCODONE-acetaminophen (PERCOCET) 5-325 MG tablet, Take 1-2 tablets by mouth every 4 (four) hours as needed for severe pain. (Patient not taking: Reported on 04/19/2018), Disp: 30 tablet, Rfl: 0 trimethoprim-polymyxin b (POLYTRIM) ophthalmic solution, Place 1 drop into both eyes every 4 (four) hours. Give enough for 7 days (Patient not taking: Reported on 04/27/2018), Disp: 10 mL, Rfl: 0  No current facility-administered medications for this visit.     Blood pressure (!) 168/91, pulse 87, height 5' 2.5" (1.588 m), weight 219 lb (99.3 kg).  Physical Exam: Vulva is healing well still a litlle erythema as expected No leukoplakia noted  Diagnostic Tests:   Pathology: VINIII  Impression: S/p laser vaporization of VINIII  Plan: Also has  a right lateral breast boil, recommend hot compresses and silvadene cram, no mashing  Follow up: 6  months  Florian Buff, MD

## 2018-07-22 ENCOUNTER — Ambulatory Visit: Payer: 59 | Admitting: Nurse Practitioner

## 2018-08-15 ENCOUNTER — Encounter: Payer: Self-pay | Admitting: Nurse Practitioner

## 2018-08-15 ENCOUNTER — Ambulatory Visit (INDEPENDENT_AMBULATORY_CARE_PROVIDER_SITE_OTHER): Payer: Medicare HMO | Admitting: Nurse Practitioner

## 2018-08-15 ENCOUNTER — Other Ambulatory Visit: Payer: Self-pay

## 2018-08-15 VITALS — BP 152/82 | HR 91 | Temp 97.3°F | Ht 62.0 in | Wt 217.0 lb

## 2018-08-15 DIAGNOSIS — F3341 Major depressive disorder, recurrent, in partial remission: Secondary | ICD-10-CM

## 2018-08-15 DIAGNOSIS — R739 Hyperglycemia, unspecified: Secondary | ICD-10-CM

## 2018-08-15 DIAGNOSIS — E785 Hyperlipidemia, unspecified: Secondary | ICD-10-CM | POA: Diagnosis not present

## 2018-08-15 DIAGNOSIS — E039 Hypothyroidism, unspecified: Secondary | ICD-10-CM | POA: Diagnosis not present

## 2018-08-15 DIAGNOSIS — B078 Other viral warts: Secondary | ICD-10-CM

## 2018-08-15 DIAGNOSIS — F5101 Primary insomnia: Secondary | ICD-10-CM | POA: Diagnosis not present

## 2018-08-15 DIAGNOSIS — Z6833 Body mass index (BMI) 33.0-33.9, adult: Secondary | ICD-10-CM | POA: Diagnosis not present

## 2018-08-15 DIAGNOSIS — F419 Anxiety disorder, unspecified: Secondary | ICD-10-CM

## 2018-08-15 DIAGNOSIS — I10 Essential (primary) hypertension: Secondary | ICD-10-CM | POA: Diagnosis not present

## 2018-08-15 MED ORDER — ALPRAZOLAM 0.5 MG PO TABS: 0.5000 mg | ORAL_TABLET | Freq: Two times a day (BID) | ORAL | 1 refills | Status: DC

## 2018-08-15 MED ORDER — MUPIROCIN 2 % EX OINT: 1.0000 "application " | TOPICAL_OINTMENT | Freq: Two times a day (BID) | CUTANEOUS | 0 refills | Status: DC

## 2018-08-15 MED ORDER — ZOLPIDEM TARTRATE 5 MG PO TABS: 10.0000 mg | ORAL_TABLET | Freq: Every day | ORAL | 1 refills | Status: DC

## 2018-08-15 NOTE — Progress Notes (Signed)
Subjective:    Patient ID: [name], female    DOB: [birth year], 63 y.o.   MRN: [id number]   Chief Complaint: Medical Management of Chronic Issues   HPI:  1. Essential hypertension  no c/o chest pain, sob or headache. Does not check blood pressure at home. Patient is very anxious about her insurance. BP Readings from Last 3 Encounters:  08/15/18 (!) 171/95  05/30/18 (!) 168/91  04/27/18 (!) 152/87     2. Hyperlipidemia with target LDL less than 100  Does not watch diet and does no exercise  3. Acquired hypothyroidism  No problem that aware of  4. Anxiety  Is on xanax 0.33m bid  5. Recurrent major depressive disorder, in partial remission ([facility]  is currently on no antidepressant [facility] 08/15/2018 04/19/2018 03/21/2018  Decreased Interest 0 1 1  Down, Depressed, Hopeless _0 PHQ - 2 Score _1 Altered sleeping _2 Tired, decreased energy _3 Change in appetite 2 0 0  Feeling bad or failure about yourself  0 0 0  Trouble concentrating 0 1 1  Moving slowly or fidgety/restless 0 0 0  Suicidal thoughts 0 0 0  PHQ-9 Score _4 Some recent data might be hidden     6. Primary insomnia  ambien nightly- she is not able to sleep without meds.  7. BMI 33.0-33.9,adult  No weight changes    Outpatient Encounter Medications as of 08/15/2018  Medication Sig  . albuterol (PROVENTIL HFA;VENTOLIN HFA) 108 (90 Base) MCG/ACT inhaler Inhale 2 puffs into the lungs every 6 (six) hours as needed for wheezing or shortness of breath.  . ALPRAZolam (XANAX) 0.5 MG tablet Take 1 tablet (0.5 mg total) by mouth 2 (two) times daily.  .Marland KitchenApple Cid Vn-Grn Tea-Bit Or-Cr (APPLE CIDER VINEGAR PLUS) TABS Take 1 tablet by mouth daily.   . Coenzyme Q10 100 MG capsule Take 100 mg by mouth daily.   . fenofibrate 160 MG tablet Take 1 tablet (160 mg total) by mouth daily.  . Flaxseed, Linseed, (FLAXSEED OIL) 1000 MG CAPS Take 1,000 mg by mouth daily.   .Marland KitchenHAWTHORNE BERRY PO  Take 565 mg by mouth daily.   . INOSITOL PO Take 1 capsule by mouth daily.  .Marland Kitchenketorolac (TORADOL) 10 MG tablet Take 1 tablet (10 mg total) by mouth every 8 (eight) hours as needed.  .Marland Kitchenlevothyroxine (SYNTHROID) 75 MCG tablet Take 1 tablet (75 mcg total) by mouth daily before breakfast.  . losartan (COZAAR) 100 MG tablet Take 1 tablet (100 mg total) by mouth daily.  .Marland KitchenLysine 500 MG CAPS Take 500 mg by mouth 2 (two) times a week.   . Milk Thistle 250 MG CAPS Take 250 mg by mouth daily.   . Omega-3 Fatty Acids (FISH OIL) 1000 MG CAPS Take 1,000 mg by mouth daily.   . ondansetron (ZOFRAN ODT) 8 MG disintegrating tablet Take 1 tablet (8 mg total) by mouth every 8 (eight) hours as needed for nausea or vomiting.  .Marland KitchenOVER THE COUNTER MEDICATION Take 1 capsule by mouth 2 (two) times daily. Liva Plex Supplement  . OVER THE COUNTER MEDICATION Take 3 tablets by mouth 3 (three) times daily. Cata Plex Supplement  . silver sulfADIAZINE (SILVADENE) 1 % cream Apply 3-4 times daily  . SUPER B COMPLEX/C PO Take 1 capsule by mouth daily.  .Marland Kitchentrimethoprim-polymyxin b (POLYTRIM) ophthalmic solution Place 1 drop into  both eyes every 4 (four) hours. Give enough for 7 days  . zolpidem (AMBIEN) 5 MG tablet Take 2 tablets (10 mg total) by mouth at bedtime.       New complaints: Has a warty lesion on her left thigh  Social history: Is on disability. 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   Review of Systems  Constitutional: Negative for activity change and appetite change.  HENT: Negative.   Eyes: Negative for pain.  Respiratory: Negative for shortness of breath.   Cardiovascular: Negative for chest pain, palpitations and leg swelling.  Gastrointestinal: Negative for abdominal pain.  Endocrine: Negative for polydipsia.  Genitourinary: Negative.   Skin: Negative for rash.  Neurological: Negative for dizziness, weakness and headaches.  Hematological: Does not bruise/bleed  easily.  Psychiatric/Behavioral: Negative.   All other systems reviewed and are negative.      Objective:   Physical Exam Vitals signs and nursing note reviewed.  Constitutional:      General: She is not in acute distress.    Appearance: Normal appearance. She is well-developed.  HENT:     Head: Normocephalic.     Nose: Nose normal.  Eyes:     Pupils: Pupils are equal, round, and reactive to light.  Neck:     Musculoskeletal: Normal range of motion and neck supple.     Vascular: No carotid bruit or JVD.  Cardiovascular:     Rate and Rhythm: Normal rate and regular rhythm.     Heart sounds: Normal heart sounds.  Pulmonary:     Effort: Pulmonary effort is normal. No respiratory distress.     Breath sounds: Normal breath sounds. No wheezing or rales.  Chest:     Chest wall: No tenderness.  Abdominal:     General: Bowel sounds are normal. There is no distension or abdominal bruit.     Palpations: Abdomen is soft. There is no hepatomegaly, splenomegaly, mass or pulsatile mass.     Tenderness: There is no abdominal tenderness.  Musculoskeletal: Normal range of motion.  Lymphadenopathy:     Cervical: No cervical adenopathy.  Skin:    General: Skin is warm and dry.     Comments: 1cm flesh colored scaly lesion on left anterior thigh  Neurological:     Mental Status: She is alert and oriented to person, place, and time.     Deep Tendon Reflexes: Reflexes are normal and symmetric.  Psychiatric:        Behavior: Behavior normal.        Thought Content: Thought content normal.        Judgment: Judgment normal.    BP (!) 152/82 (BP Location: Left Arm, Cuff Size: Normal)   Pulse 91   Temp (!) 97.3 F (36.3 C) (Oral)   Ht _0  (1.575 m)   Wt 217 lb (98.4 kg)   BMI 39.69 kg/m   Cryotherapy of lesion left thigh- patient tolerated well.      Assessment & Plan:  ANORA SCHWENKE comes in today with chief complaint of Medical Management of Chronic Issues   Diagnosis and  orders addressed:  1. Essential hypertension Low sodium diet - CMP14+EGFR  2. Hyperlipidemia with target LDL less than 100 Low fat diet - Lipid panel  3. Acquired hypothyroidism - Thyroid Panel With TSH  4. Anxiety Stress management - ALPRAZolam (XANAX) 0.5 MG tablet; Take 1 tablet (0.5 mg total) by mouth 2 (two) times daily.  Dispense: 180 tablet;  Refill: 1  5. Recurrent major depressive disorder, in partial remission ([place])  6. Primary insomnia Bedtime routine - zolpidem (AMBIEN) 5 MG tablet; Take 2 tablets (10 mg total) by mouth at bedtime.  Dispense: 90 tablet; Refill: 1  7. BMI 33.0-33.9,adult Discussed diet and exercise for person with BMI >25 Will recheck weight in 3-6 months  8. Other viral warts cryotherapy   Labs pending Health Maintenance reviewed Diet and exercise encouraged  Follow up plan: 6 months   [name] Done, FNP

## 2018-08-15 NOTE — Patient Instructions (Signed)
Cryotherapy  What is cryotherapy?  Cryotherapy, or cold therapy, is a treatment that uses cold temperatures to treat an injury or medical condition. It includes using cold packs or ice packs to reduce pain and swelling.  Who should not use cryotherapy?  Cryotherapy is not safe for people who cannot tell you if they are in pain, such as small children and people who have dementia. Cryotherapy is also not safe for people with certain conditions, such as:  · Raynaud phenomenon.  · Cold hypersensitivity.  · Numbness or loss of feeling in the area being iced.  Cryotherapy may or may not be safe for people with certain other conditions. Do not use cryotherapy without your health care provider's approval if you have:  · A heart condition.  · High blood pressure.  · Open or healing wounds.  · An infection.  · Rheumatoid arthritis.  · Poor circulation.  · Diabetes.  · Certain skin conditions.  How do I use cryotherapy?  To use cryotherapy at home to reduce pain and swelling:  · Place a towel between the cold source and your skin.  · Apply the cold source for no more than 20 minutes at a time.  · Check your skin after 5 minutes to make sure there are no signs of a poor response to cold or skin damage. Check for:  ? White spots on your skin. Your skin may look blotchy or mottled.  ? Skin that looks blue or pale.  ? Skin that feels waxy or hard.  · Repeat these steps as many times each day as told by your health care provider.  How can I make a cold pack?  When using a cold pack at home to reduce pain and swelling, you can use:  · A silica gel cold pack that has been left in the freezer. You can buy this online or in stores.  · A plastic bag of frozen vegetables.  · A sealable plastic bag that has been filled with crushed ice.  Always wrap the pack in a dry or damp towel to avoid direct contact with your skin.  Contact a health care provider if:  · You develop white spots on your skin. This may give your skin a blotchy or  mottled look.  · Your skin turns blue or pale.  · Your skin becomes waxy or hard.  · Your swelling gets worse.  This information is not intended to replace advice given to you by your health care provider. Make sure you discuss any questions you have with your health care provider.  Document Released: 01/12/2011 Document Revised: 07/23/2016 Document Reviewed: 01/30/2015  Elsevier Interactive Patient Education © 2019 Elsevier Inc.

## 2018-08-16 LAB — LIPID PANEL
CHOLESTEROL TOTAL: 206 mg/dL — AB (ref 100–199)
Chol/HDL Ratio: 6.6 ratio — ABNORMAL HIGH (ref 0.0–4.4)
HDL: 31 mg/dL — ABNORMAL LOW (ref >39)
LDL Calculated: 130 mg/dL — ABNORMAL HIGH (ref 0–99)
Triglycerides: 227 mg/dL — ABNORMAL HIGH (ref 0–149)
VLDL CHOLESTEROL CAL: 45 mg/dL — AB (ref 5–40)

## 2018-08-16 LAB — CMP14+EGFR
ALT: 29 IU/L (ref 0–32)
AST: 19 IU/L (ref 0–40)
Albumin/Globulin Ratio: 2.1 (ref 1.2–2.2)
Albumin: 4.6 g/dL (ref 3.8–4.8)
Alkaline Phosphatase: 58 IU/L (ref 39–117)
BUN/Creatinine Ratio: 13 (ref 12–28)
BUN: 13 mg/dL (ref 8–27)
Bilirubin Total: 0.3 mg/dL (ref 0.0–1.2)
CO2: 21 mmol/L (ref 20–29)
Calcium: 9.2 mg/dL (ref 8.7–10.3)
Chloride: 104 mmol/L (ref 96–106)
Creatinine, Ser: 1.03 mg/dL — ABNORMAL HIGH (ref 0.57–1.00)
GFR calc Af Amer: 67 mL/min/1.73
GFR calc non Af Amer: 58 mL/min/1.73 — ABNORMAL LOW
Globulin, Total: 2.2 g/dL (ref 1.5–4.5)
Glucose: 127 mg/dL — ABNORMAL HIGH (ref 65–99)
Potassium: 4.3 mmol/L (ref 3.5–5.2)
Sodium: 140 mmol/L (ref 134–144)
Total Protein: 6.8 g/dL (ref 6.0–8.5)

## 2018-08-16 LAB — THYROID PANEL WITH TSH
Free Thyroxine Index: 1.9 (ref 1.2–4.9)
T3 Uptake Ratio: 28 % (ref 24–39)
T4 TOTAL: 6.7 ug/dL (ref 4.5–12.0)
TSH: 2.75 u[IU]/mL (ref 0.450–4.500)

## 2018-08-16 NOTE — Addendum Note (Signed)
Addended by: Chevis Pretty on: 08/16/2018 09:16 AM   Modules accepted: Orders

## 2018-08-17 DIAGNOSIS — R739 Hyperglycemia, unspecified: Secondary | ICD-10-CM | POA: Diagnosis not present

## 2018-08-17 LAB — BAYER DCA HB A1C WAIVED: HB A1C (BAYER DCA - WAIVED): 6.9 % (ref <7.0)

## 2018-08-18 ENCOUNTER — Encounter: Payer: Self-pay | Admitting: Nurse Practitioner

## 2018-08-18 DIAGNOSIS — E119 Type 2 diabetes mellitus without complications: Secondary | ICD-10-CM | POA: Insufficient documentation

## 2018-08-18 DIAGNOSIS — E1169 Type 2 diabetes mellitus with other specified complication: Secondary | ICD-10-CM | POA: Insufficient documentation

## 2018-11-28 ENCOUNTER — Other Ambulatory Visit: Payer: Self-pay

## 2018-11-28 ENCOUNTER — Ambulatory Visit (INDEPENDENT_AMBULATORY_CARE_PROVIDER_SITE_OTHER): Payer: Medicare HMO | Admitting: Obstetrics & Gynecology

## 2018-11-28 ENCOUNTER — Encounter: Payer: Self-pay | Admitting: Obstetrics & Gynecology

## 2018-11-28 VITALS — BP 140/81 | HR 96 | Ht 62.5 in | Wt 204.0 lb

## 2018-11-28 DIAGNOSIS — D071 Carcinoma in situ of vulva: Secondary | ICD-10-CM

## 2018-11-28 DIAGNOSIS — Z9889 Other specified postprocedural states: Secondary | ICD-10-CM | POA: Diagnosis not present

## 2018-11-28 NOTE — Progress Notes (Signed)
Chief Complaint  Patient presents with  . Follow-up      63 y.o. No obstetric history on file. No LMP recorded. Patient has had a hysterectomy. The current method of family planning is status post hysterectomy.  Outpatient Encounter Medications as of 11/28/2018  Medication Sig  . albuterol (PROVENTIL HFA;VENTOLIN HFA) 108 (90 Base) MCG/ACT inhaler Inhale 2 puffs into the lungs every 6 (six) hours as needed for wheezing or shortness of breath.  . ALPRAZolam (XANAX) 0.5 MG tablet Take 1 tablet (0.5 mg total) by mouth 2 (two) times daily.  Marland Kitchen Apple Cid Vn-Grn Tea-Bit Or-Cr (APPLE CIDER VINEGAR PLUS) TABS Take 1 tablet by mouth daily.   . Coenzyme Q10 100 MG capsule Take 100 mg by mouth daily.   . fenofibrate 160 MG tablet Take 1 tablet (160 mg total) by mouth daily.  . Flaxseed, Linseed, (FLAXSEED OIL) 1000 MG CAPS Take 1,000 mg by mouth daily.   Marland Kitchen HAWTHORNE BERRY PO Take 565 mg by mouth daily.   . INOSITOL PO Take 1 capsule by mouth daily.  Marland Kitchen levothyroxine (SYNTHROID) 75 MCG tablet Take 1 tablet (75 mcg total) by mouth daily before breakfast.  . losartan (COZAAR) 100 MG tablet Take 1 tablet (100 mg total) by mouth daily.  Marland Kitchen Lysine 500 MG CAPS Take 500 mg by mouth 2 (two) times a week.   . Milk Thistle 250 MG CAPS Take 250 mg by mouth daily.   . mupirocin ointment (BACTROBAN) 2 % Place 1 application into the nose 2 (two) times daily.  . Omega-3 Fatty Acids (FISH OIL) 1000 MG CAPS Take 1,000 mg by mouth daily.   Marland Kitchen OVER THE COUNTER MEDICATION Take 1 capsule by mouth 2 (two) times daily. Liva Plex Supplement  . OVER THE COUNTER MEDICATION Take 3 tablets by mouth 3 (three) times daily. Cata Plex Supplement  . SUPER B COMPLEX/C PO Take 1 capsule by mouth daily.  Marland Kitchen zolpidem (AMBIEN) 5 MG tablet Take 2 tablets (10 mg total) by mouth at bedtime.  Marland Kitchen ketorolac (TORADOL) 10 MG tablet Take 1 tablet (10 mg total) by mouth every 8 (eight) hours as needed. (Patient not taking: Reported on  11/28/2018)  . ondansetron (ZOFRAN ODT) 8 MG disintegrating tablet Take 1 tablet (8 mg total) by mouth every 8 (eight) hours as needed for nausea or vomiting. (Patient not taking: Reported on 11/28/2018)  . silver sulfADIAZINE (SILVADENE) 1 % cream Apply 3-4 times daily (Patient not taking: Reported on 11/28/2018)   No facility-administered encounter medications on file as of 11/28/2018.     Subjective Pt is 7 months post op from laser vulvectomy for VINIII No complaints  No itching erythema or problems at all Past Medical History:  Diagnosis Date  . Allergy    sulfa  . Anxiety 03/09/2012  . 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 Chronic hoarseness    years  . Dysphagia   . Hypertension   . Hypertriglyceridemia     Past Surgical History:  Procedure Laterality Date  . COLON RESECTION    . KNEE SURGERY    . oral surgery tooth extraction    . TONSILLECTOMY    . VAGINAL HYSTERECTOMY    . VULVECTOMY PARTIAL Bilateral 03/30/2018   Procedure: LASER PARTIAL VULVECTOMY FOR VULVUL INTRAEPITHELIA NEOPLASIS III;  Surgeon: Florian Buff, MD;  Location: [facility];  Service: Gynecology;  Laterality: Bilateral;    OB History  No obstetric history on file.     Allergies  Allergen Reactions  . Azithromycin Diarrhea  . Sulfa Antibiotics Other (See Comments)    Unable to recall    Social History   Socioeconomic History  . Marital status: Married    Spouse name: Not on file  . Number of children: 1  . Years of education: Not on file  . Highest education level: Not on file  Occupational History    Employer: [employer]  Social Needs  . Financial resource strain: Not on file  . Food insecurity    Worry: Not on file    Inability: Not on file  . Transportation needs    Medical: Not on file    Non-medical: Not on file  Tobacco Use  . Smoking status: Former Smoker    Packs/day: 0.25    Years: 40.00     Pack years: 10.00    Types: Cigarettes  . Smokeless tobacco: Never Used  Substance and Sexual Activity  . Alcohol use: Yes  . Drug use: No  . Sexual activity: Not Currently    Birth control/protection: None  Lifestyle  . Physical activity    Days per week: Not on file    Minutes per session: Not on file  . Stress: Not on file  Relationships  . Social Herbalist on phone: Not on file    Gets together: Not on file    Attends religious service: Not on file    Active member of club or organization: Not on file    Attends meetings of clubs or organizations: Not on file    Relationship status: Not on file  Other Topics Concern  . Not on file  Social History Narrative  . Not on file    Family History  Problem Relation Age of Onset  . Cancer Mother 14       breast   . Heart attack Father   . Heart failure Father   . Cancer Maternal Aunt 40       pancreas  . Cancer Maternal Grandfather        lung    Medications:       Current Outpatient Medications:  .  albuterol (PROVENTIL HFA;VENTOLIN HFA) 108 (90 Base) MCG/ACT inhaler, Inhale 2 puffs into the lungs every 6 (six) hours as needed for wheezing or shortness of breath., Disp: 1 Inhaler, Rfl: 0 .  ALPRAZolam (XANAX) 0.5 MG tablet, Take 1 tablet (0.5 mg total) by mouth 2 (two) times daily., Disp: 180 tablet, Rfl: 1 .  Apple Cid Vn-Grn Tea-Bit Or-Cr (APPLE CIDER VINEGAR PLUS) TABS, Take 1 tablet by mouth daily. , Disp: , Rfl:  .  Coenzyme Q10 100 MG capsule, Take 100 mg by mouth daily. , Disp: , Rfl:  .  fenofibrate 160 MG tablet, Take 1 tablet (160 mg total) by mouth daily., Disp: 90 tablet, Rfl: 1 .  Flaxseed, Linseed, (FLAXSEED OIL) 1000 MG CAPS, Take 1,000 mg by mouth daily. , Disp: , Rfl:  .  HAWTHORNE BERRY PO, Take 565 mg by mouth daily. , Disp: , Rfl:  .  INOSITOL PO, Take 1 capsule by mouth daily., Disp: , Rfl:  .  levothyroxine (SYNTHROID) 75 MCG tablet, Take 1 tablet (75 mcg total) by mouth daily before  breakfast., Disp: 90 tablet, Rfl: 1 .  losartan (COZAAR) 100 MG tablet, Take 1 tablet (100 mg total) by mouth daily., Disp: 90 tablet, Rfl: 1 .  Lysine  500 MG CAPS, Take 500 mg by mouth 2 (two) times a week. , Disp: , Rfl:  .  Milk Thistle 250 MG CAPS, Take 250 mg by mouth daily. , Disp: , Rfl:  .  mupirocin ointment (BACTROBAN) 2 %, Place 1 application into the nose 2 (two) times daily., Disp: 22 g, Rfl: 0 .  Omega-3 Fatty Acids (FISH OIL) 1000 MG CAPS, Take 1,000 mg by mouth daily. , Disp: , Rfl:  .  OVER THE COUNTER MEDICATION, Take 1 capsule by mouth 2 (two) times daily. Liva Plex Supplement, Disp: , Rfl:  .  OVER THE COUNTER MEDICATION, Take 3 tablets by mouth 3 (three) times daily. Cata Plex Supplement, Disp: , Rfl:  .  SUPER B COMPLEX/C PO, Take 1 capsule by mouth daily., Disp: , Rfl:  .  zolpidem (AMBIEN) 5 MG tablet, Take 2 tablets (10 mg total) by mouth at bedtime., Disp: 90 tablet, Rfl: 1 .  ketorolac (TORADOL) 10 MG tablet, Take 1 tablet (10 mg total) by mouth every 8 (eight) hours as needed. (Patient not taking: Reported on 11/28/2018), Disp: 15 tablet, Rfl: 0 .  ondansetron (ZOFRAN ODT) 8 MG disintegrating tablet, Take 1 tablet (8 mg total) by mouth every 8 (eight) hours as needed for nausea or vomiting. (Patient not taking: Reported on 11/28/2018), Disp: 20 tablet, Rfl: 0 .  silver sulfADIAZINE (SILVADENE) 1 % cream, Apply 3-4 times daily (Patient not taking: Reported on 11/28/2018), Disp: 50 g, Rfl: 11  Objective Blood pressure 140/81, pulse 96, height 5' 2.5" (1.588 m), weight 204 lb (92.5 kg).  General WDWN female NAD Vulva:  S/P laser vulvectomy, partial, NED no lesions or erythema Vagina:  normal mucosa, no discharge Cervix:  absent Uterus:  uterus absent Adnexa: ovaries:,     Pertinent ROS No burning with urination, frequency or urgency No nausea, vomiting or diarrhea Nor fever chills or other constitutional symptoms   Labs or studies     Impression Diagnoses  this Encounter::   ICD-10-CM   1. VIN III (vulvar intraepithelial neoplasia III)  D07.1   2. Post-operative state  Z98.890     Established relevant diagnosis(es):   Plan/Recommendations: No orders of the defined types were placed in this encounter.   Labs or Scans Ordered: No orders of the defined types were placed in this encounter.   Management:: NED VIN III  Follow up Return in about 6 months (around 05/30/2019) for Follow up, with Dr Elonda Husky.       All questions were answered.

## 2018-12-19 ENCOUNTER — Other Ambulatory Visit: Payer: Self-pay | Admitting: Nurse Practitioner

## 2018-12-19 DIAGNOSIS — E039 Hypothyroidism, unspecified: Secondary | ICD-10-CM

## 2018-12-19 DIAGNOSIS — I10 Essential (primary) hypertension: Secondary | ICD-10-CM

## 2018-12-29 ENCOUNTER — Telehealth: Payer: Self-pay | Admitting: Nurse Practitioner

## 2018-12-29 NOTE — Chronic Care Management (AMB) (Signed)
Chronic Care Management   Note  12/29/2018 Name: [name] MRN: [id number] DOB: 1956-01-07  [name] is a 63 y.o. year old female who is a primary care patient of Chevis Pretty, [place]. I reached out to [name] by phone today in response to a referral sent by [name]'s health plan.    Ms. Westendorf was given information about Chronic Care Management services today including:  1. CCM service includes personalized support from designated clinical staff supervised by her physician, including individualized plan of care and coordination with other care providers 2. 24/7 contact phone numbers for assistance for urgent and routine care needs. 3. Service will only be billed when office clinical staff spend 20 minutes or more in a month to coordinate care. 4. Only one practitioner may furnish and bill the service in a calendar month. 5. The patient may stop CCM services at any time (effective at the end of the month) by phone call to the office staff. 6. The patient will be responsible for cost sharing (co-pay) of up to 20% of the service fee (after annual deductible is met).  Patient did not agree to enrollment in care management services and does not wish to consider at this time.  Follow up plan: The patient has been provided with contact information for the chronic care management team and has been advised to call with any health related questions or concerns.   Gutierrez  [email or web address]   ??[phone]

## 2019-01-31 ENCOUNTER — Ambulatory Visit: Payer: Medicare HMO | Admitting: Nurse Practitioner

## 2019-02-13 ENCOUNTER — Ambulatory Visit: Payer: Medicare HMO | Admitting: Nurse Practitioner

## 2019-02-16 ENCOUNTER — Other Ambulatory Visit: Payer: Self-pay

## 2019-02-17 ENCOUNTER — Ambulatory Visit (INDEPENDENT_AMBULATORY_CARE_PROVIDER_SITE_OTHER): Payer: Medicare HMO | Admitting: Nurse Practitioner

## 2019-02-17 ENCOUNTER — Encounter: Payer: Self-pay | Admitting: Nurse Practitioner

## 2019-02-17 VITALS — BP 136/85 | HR 69 | Temp 97.8°F | Resp 20 | Ht 62.0 in | Wt 200.0 lb

## 2019-02-17 DIAGNOSIS — E039 Hypothyroidism, unspecified: Secondary | ICD-10-CM

## 2019-02-17 DIAGNOSIS — F3341 Major depressive disorder, recurrent, in partial remission: Secondary | ICD-10-CM | POA: Diagnosis not present

## 2019-02-17 DIAGNOSIS — F5101 Primary insomnia: Secondary | ICD-10-CM | POA: Diagnosis not present

## 2019-02-17 DIAGNOSIS — E1129 Type 2 diabetes mellitus with other diabetic kidney complication: Secondary | ICD-10-CM

## 2019-02-17 DIAGNOSIS — I10 Essential (primary) hypertension: Secondary | ICD-10-CM

## 2019-02-17 DIAGNOSIS — E785 Hyperlipidemia, unspecified: Secondary | ICD-10-CM | POA: Diagnosis not present

## 2019-02-17 DIAGNOSIS — F410 Panic disorder [episodic paroxysmal anxiety] without agoraphobia: Secondary | ICD-10-CM | POA: Diagnosis not present

## 2019-02-17 DIAGNOSIS — Z23 Encounter for immunization: Secondary | ICD-10-CM | POA: Diagnosis not present

## 2019-02-17 DIAGNOSIS — Z6836 Body mass index (BMI) 36.0-36.9, adult: Secondary | ICD-10-CM

## 2019-02-17 DIAGNOSIS — Z8614 Personal history of Methicillin resistant Staphylococcus aureus infection: Secondary | ICD-10-CM

## 2019-02-17 DIAGNOSIS — R809 Proteinuria, unspecified: Secondary | ICD-10-CM

## 2019-02-17 DIAGNOSIS — F419 Anxiety disorder, unspecified: Secondary | ICD-10-CM | POA: Diagnosis not present

## 2019-02-17 LAB — BAYER DCA HB A1C WAIVED: HB A1C (BAYER DCA - WAIVED): 5.7 % (ref <7.0)

## 2019-02-17 MED ORDER — LOSARTAN POTASSIUM 100 MG PO TABS: 100.0000 mg | ORAL_TABLET | Freq: Every day | ORAL | 1 refills | Status: DC

## 2019-02-17 MED ORDER — ALPRAZOLAM 0.5 MG PO TABS: 0.5000 mg | ORAL_TABLET | Freq: Two times a day (BID) | ORAL | 1 refills | Status: DC

## 2019-02-17 MED ORDER — FENOFIBRATE 160 MG PO TABS: 160.0000 mg | ORAL_TABLET | Freq: Every day | ORAL | 1 refills | Status: DC

## 2019-02-17 MED ORDER — SODIUM FLUORIDE 1.1 % DT GEL: 1.0000 "application " | Freq: Every day | DENTAL | 11 refills | Status: DC

## 2019-02-17 MED ORDER — LEVOTHYROXINE SODIUM 75 MCG PO TABS: ORAL_TABLET | ORAL | 1 refills | Status: DC

## 2019-02-17 MED ORDER — ZOLPIDEM TARTRATE 5 MG PO TABS: 10.0000 mg | ORAL_TABLET | Freq: Every day | ORAL | 1 refills | Status: DC

## 2019-02-17 MED ORDER — MUPIROCIN 2 % EX OINT: 1.0000 "application " | TOPICAL_OINTMENT | Freq: Two times a day (BID) | CUTANEOUS | 0 refills | Status: DC

## 2019-02-17 NOTE — Progress Notes (Signed)
Subjective:    Patient ID: [name], female    DOB: 21-Jan-1956, 63 y.o.   MRN: [id number]   Chief Complaint: medical management of chronic issues   HPI:  1. Essential hypertension No c/o chest pain, sob or headache. Does not check blood pressure at home. BP Readings from Last 3 Encounters:  11/28/18 140/81  08/15/18 (!) 152/82  05/30/18 (!) 168/91     2. Hyperlipidemia with target LDL less than 100 Does not watch diet and does no dedicated exercise.'she refuses to take a statin due to body aches. She does take coq10 daily Lab Results  Component Value Date   CHOL 206 (H) 08/15/2018   HDL 31 (L) 08/15/2018   LDLCALC 130 (H) 08/15/2018   TRIG 227 (H) 08/15/2018   CHOLHDL 6.6 (H) 08/15/2018     3. Acquired hypothyroidism No problems that she s aware of Lab Results  Component Value Date   TSH 2.750 08/15/2018    4. Type 2 diabetes mellitus with microalbuminuria, without long-term current use of insulin (HCC) She does not check blood sugars at home. Denies any symptoms of hypoglycemia. Lab Results  Component Value Date   HGBA1C 6.9 08/17/2018     5. Anxiety Is a very anxious person. Worries all the time. Takes xanax 2x a day. GAD 7 : Generalized Anxiety Score 02/17/2019 10/07/2017  Nervous, Anxious, on Edge 3 3  Control/stop worrying 2 3  Worry too much - different things 2 2  Trouble relaxing 2 2  Restless 0 3  Easily annoyed or irritable 0 1  Afraid - awful might happen 0 2  Total GAD 7 Score 9 16  Anxiety Difficulty Somewhat difficult Somewhat difficult      6. Recurrent major depressive disorder, in partial remission ([place]) Is currently on no antidepressants. Says she is doing okay. Depression screen [facility] 2/9 02/17/2019 08/15/2018 04/19/2018  Decreased Interest 0 0 1  Down, Depressed, Hopeless 0 2 1  PHQ - 2 Score 0 2 2  Altered sleeping - 3 3  Tired, decreased energy - 2 3  Change in appetite - 2 0  Feeling bad or failure about yourself  - 0 0   Trouble concentrating - 0 1  Moving slowly or fidgety/restless - 0 0  Suicidal thoughts - 0 0  PHQ-9 Score - 9 9  Some recent data might be hidden    7. Panic attacks Has had some mild panic attacks, especially when she has a lot going on.  8. Primary insomnia Uses ambien to sleep at night. Is not able to sleep if she does not take  9. BMI 36.0-36.9,adult Weigh is down 3 lbs since last visit Wt Readings from Last 3 Encounters:  11/28/18 204 lb (92.5 kg)  08/15/18 217 lb (98.4 kg)  05/30/18 219 lb (99.3 kg)   BMI Readings from Last 3 Encounters:  11/28/18 36.72 kg/m  08/15/18 39.69 kg/m  05/30/18 39.42 kg/m       Outpatient Encounter Medications as of 02/17/2019  Medication Sig  . albuterol (PROVENTIL HFA;VENTOLIN HFA) 108 (90 Base) MCG/ACT inhaler Inhale 2 puffs into the lungs every 6 (six) hours as needed for wheezing or shortness of breath.  . ALPRAZolam (XANAX) 0.5 MG tablet Take 1 tablet (0.5 mg total) by mouth 2 (two) times daily.  Marland Kitchen Apple Cid Vn-Grn Tea-Bit Or-Cr (APPLE CIDER VINEGAR PLUS) TABS Take 1 tablet by mouth daily.   . Coenzyme Q10 100 MG capsule Take 100 mg by mouth  daily.   . fenofibrate 160 MG tablet TAKE 1 TABLET EVERY DAY  . Flaxseed, Linseed, (FLAXSEED OIL) 1000 MG CAPS Take 1,000 mg by mouth daily.   Marland Kitchen HAWTHORNE BERRY PO Take 565 mg by mouth daily.   . INOSITOL PO Take 1 capsule by mouth daily.  Marland Kitchen ketorolac (TORADOL) 10 MG tablet Take 1 tablet (10 mg total) by mouth every 8 (eight) hours as needed. (Patient not taking: Reported on 11/28/2018)  . levothyroxine (SYNTHROID) 75 MCG tablet TAKE 1 TABLET EVERY DAY BEFORE BREAKFAST  . losartan (COZAAR) 100 MG tablet TAKE 1 TABLET EVERY DAY  . Lysine 500 MG CAPS Take 500 mg by mouth 2 (two) times a week.   . Milk Thistle 250 MG CAPS Take 250 mg by mouth daily.   . mupirocin ointment (BACTROBAN) 2 % Place 1 application into the nose 2 (two) times daily.  . Omega-3 Fatty Acids (FISH OIL) 1000 MG CAPS Take  1,000 mg by mouth daily.   . ondansetron (ZOFRAN ODT) 8 MG disintegrating tablet Take 1 tablet (8 mg total) by mouth every 8 (eight) hours as needed for nausea or vomiting. (Patient not taking: Reported on 11/28/2018)  . OVER THE COUNTER MEDICATION Take 1 capsule by mouth 2 (two) times daily. Liva Plex Supplement  . OVER THE COUNTER MEDICATION Take 3 tablets by mouth 3 (three) times daily. Cata Plex Supplement  . silver sulfADIAZINE (SILVADENE) 1 % cream Apply 3-4 times daily (Patient not taking: Reported on 11/28/2018)  . SUPER B COMPLEX/C PO Take 1 capsule by mouth daily.  Marland Kitchen zolpidem (AMBIEN) 5 MG tablet Take 2 tablets (10 mg total) by mouth at bedtime.     Past Surgical History:  Procedure Laterality Date  . COLON RESECTION    . KNEE SURGERY    . oral surgery tooth extraction    . TONSILLECTOMY    . VAGINAL HYSTERECTOMY    . VULVECTOMY PARTIAL Bilateral 03/30/2018   Procedure: LASER PARTIAL VULVECTOMY FOR VULVUL INTRAEPITHELIA NEOPLASIS III;  Surgeon: Florian Buff, MD;  Location: [facility];  Service: Gynecology;  Laterality: Bilateral;    Family History  Problem Relation Age of Onset  . Cancer Mother 73       breast   . Heart attack Father   . Heart failure Father   . Cancer Maternal Aunt 40       pancreas  . Cancer Maternal Grandfather        lung    New complaints: None today  Social history: Lives with her husband who has schizophrenia and sh eha sto do everything for him.  Controlled substance contract: 02/17/19    Review of Systems  Constitutional: Negative for activity change and appetite change.  HENT: Negative.   Eyes: Negative for pain.  Respiratory: Negative for shortness of breath.   Cardiovascular: Negative for chest pain, palpitations and leg swelling.  Gastrointestinal: Negative for abdominal pain.  Endocrine: Negative for polydipsia.  Genitourinary: Negative.   Skin: Negative for rash.  Neurological: Negative for dizziness, weakness and headaches.   Hematological: Does not bruise/bleed easily.  Psychiatric/Behavioral: Negative.   All other systems reviewed and are negative.      Objective:   Physical Exam Vitals signs and nursing note reviewed.  Constitutional:      General: She is not in acute distress.    Appearance: Normal appearance. She is well-developed.  HENT:     Head: Normocephalic.     Nose: Nose normal.  Eyes:  Pupils: Pupils are equal, round, and reactive to light.  Neck:     Musculoskeletal: Normal range of motion and neck supple.     Vascular: No carotid bruit or JVD.  Cardiovascular:     Rate and Rhythm: Normal rate and regular rhythm.     Heart sounds: Normal heart sounds.  Pulmonary:     Effort: Pulmonary effort is normal. No respiratory distress.     Breath sounds: Normal breath sounds. No wheezing or rales.  Chest:     Chest wall: No tenderness.  Abdominal:     General: Bowel sounds are normal. There is no distension or abdominal bruit.     Palpations: Abdomen is soft. There is no hepatomegaly, splenomegaly, mass or pulsatile mass.     Tenderness: There is no abdominal tenderness.  Musculoskeletal: Normal range of motion.  Lymphadenopathy:     Cervical: No cervical adenopathy.  Skin:    General: Skin is warm and dry.  Neurological:     Mental Status: She is alert and oriented to person, place, and time.     Deep Tendon Reflexes: Reflexes are normal and symmetric.  Psychiatric:        Behavior: Behavior normal.        Thought Content: Thought content normal.        Judgment: Judgment normal.    BP 136/85   Pulse 69   Temp 97.8 F (36.6 C) (Oral)   Resp 20   Ht _0  (1.575 m)   Wt 200 lb (90.7 kg)   SpO2 98%   BMI 36.58 kg/m         Assessment & Plan:  MARSA MATTEO comes in today with chief complaint of Medical Management of Chronic Issues   Diagnosis and orders addressed:  1. Essential hypertension Low sodium diet - losartan (COZAAR) 100 MG tablet; Take 1 tablet (100 mg  total) by mouth daily.  Dispense: 90 tablet; Refill: 1 - CMP14+EGFR  2. Hyperlipidemia with target LDL less than 100 Low fat diet - fenofibrate 160 MG tablet; Take 1 tablet (160 mg total) by mouth daily.  Dispense: 90 tablet; Refill: 1 - Lipid panel  3. Acquired hypothyroidism - levothyroxine (SYNTHROID) 75 MCG tablet; TAKE 1 TABLET EVERY DAY BEFORE BREAKFAST  Dispense: 90 tablet; Refill: 1 - Thyroid Panel With TSH  4. Type 2 diabetes mellitus with microalbuminuria, without long-term current use of insulin (HCC) Continue to watch carbs in diet  5. Anxiety Stress management - ALPRAZolam (XANAX) 0.5 MG tablet; Take 1 tablet (0.5 mg total) by mouth 2 (two) times daily.  Dispense: 180 tablet; Refill: 1  6. Recurrent major depressive disorder, in partial remission ([place])  7. Panic attacks  8. Primary insomnia Bedtime routine - zolpidem (AMBIEN) 5 MG tablet; Take 2 tablets (10 mg total) by mouth at bedtime.  Dispense: 90 tablet; Refill: 1  9. BMI 36.0-36.9,adult Discussed diet and exercise for person with BMI >25 Will recheck weight in 3-6 months  10. Hx MRSA infection - mupirocin ointment (BACTROBAN) 2 %; Place 1 application into the nose 2 (two) times daily.  Dispense: 22 g; Refill: 0   Labs pending Health Maintenance reviewed Diet and exercise encouraged  Follow up plan: 6 months   [name] Done, FNP

## 2019-02-17 NOTE — Patient Instructions (Signed)
Diabetes Mellitus and Foot Care Foot care is an important part of your health, especially when you have diabetes. Diabetes may cause you to have problems because of poor blood flow (circulation) to your feet and legs, which can cause your skin to:  Become thinner and drier.  Break more easily.  Heal more slowly.  Peel and crack. You may also have nerve damage (neuropathy) in your legs and feet, causing decreased feeling in them. This means that you may not notice minor injuries to your feet that could lead to more serious problems. Noticing and addressing any potential problems early is the best way to prevent future foot problems. How to care for your feet Foot hygiene  Wash your feet daily with warm water and mild soap. Do not use hot water. Then, pat your feet and the areas between your toes until they are completely dry. Do not soak your feet as this can dry your skin.  Trim your toenails straight across. Do not dig under them or around the cuticle. File the edges of your nails with an emery board or nail file.  Apply a moisturizing lotion or petroleum jelly to the skin on your feet and to dry, brittle toenails. Use lotion that does not contain alcohol and is unscented. Do not apply lotion between your toes. Shoes and socks  Wear clean socks or stockings every day. Make sure they are not too tight. Do not wear knee-high stockings since they may decrease blood flow to your legs.  Wear shoes that fit properly and have enough cushioning. Always look in your shoes before you put them on to be sure there are no objects inside.  To break in new shoes, wear them for just a few hours a day. This prevents injuries on your feet. Wounds, scrapes, corns, and calluses  Check your feet daily for blisters, cuts, bruises, sores, and redness. If you cannot see the bottom of your feet, use a mirror or ask someone for help.  Do not cut corns or calluses or try to remove them with medicine.  If you  find a minor scrape, cut, or break in the skin on your feet, keep it and the skin around it clean and dry. You may clean these areas with mild soap and water. Do not clean the area with peroxide, alcohol, or iodine.  If you have a wound, scrape, corn, or callus on your foot, look at it several times a day to make sure it is healing and not infected. Check for: ? Redness, swelling, or pain. ? Fluid or blood. ? Warmth. ? Pus or a bad smell. General instructions  Do not cross your legs. This may decrease blood flow to your feet.  Do not use heating pads or hot water bottles on your feet. They may burn your skin. If you have lost feeling in your feet or legs, you may not know this is happening until it is too late.  Protect your feet from hot and cold by wearing shoes, such as at the beach or on hot pavement.  Schedule a complete foot exam at least once a year (annually) or more often if you have foot problems. If you have foot problems, report any cuts, sores, or bruises to your health care provider immediately. Contact a health care provider if:  You have a medical condition that increases your risk of infection and you have any cuts, sores, or bruises on your feet.  You have an injury that is not   healing.  You have redness on your legs or feet.  You feel burning or tingling in your legs or feet.  You have pain or cramps in your legs and feet.  Your legs or feet are numb.  Your feet always feel cold.  You have pain around a toenail. Get help right away if:  You have a wound, scrape, corn, or callus on your foot and: ? You have pain, swelling, or redness that gets worse. ? You have fluid or blood coming from the wound, scrape, corn, or callus. ? Your wound, scrape, corn, or callus feels warm to the touch. ? You have pus or a bad smell coming from the wound, scrape, corn, or callus. ? You have a fever. ? You have a red line going up your leg. Summary  Check your feet every day  for cuts, sores, red spots, swelling, and blisters.  Moisturize feet and legs daily.  Wear shoes that fit properly and have enough cushioning.  If you have foot problems, report any cuts, sores, or bruises to your health care provider immediately.  Schedule a complete foot exam at least once a year (annually) or more often if you have foot problems. This information is not intended to replace advice given to you by your health care provider. Make sure you discuss any questions you have with your health care provider. Document Released: 05/15/2000 Document Revised: 06/30/2017 Document Reviewed: 06/19/2016 Elsevier Patient Education  2020 Elsevier Inc[employer]  

## 2019-02-18 LAB — LIPID PANEL
Chol/HDL Ratio: 5.6 ratio — ABNORMAL HIGH (ref 0.0–4.4)
Cholesterol, Total: 208 mg/dL — ABNORMAL HIGH (ref 100–199)
HDL: 37 mg/dL — ABNORMAL LOW (ref >39)
LDL Chol Calc (NIH): 136 mg/dL — ABNORMAL HIGH (ref 0–99)
Triglycerides: 192 mg/dL — ABNORMAL HIGH (ref 0–149)
VLDL Cholesterol Cal: 35 mg/dL (ref 5–40)

## 2019-02-18 LAB — CMP14+EGFR
ALT: 22 IU/L (ref 0–32)
AST: 19 IU/L (ref 0–40)
Albumin/Globulin Ratio: 1.9 (ref 1.2–2.2)
Albumin: 4.9 g/dL — ABNORMAL HIGH (ref 3.8–4.8)
Alkaline Phosphatase: 51 IU/L (ref 39–117)
BUN/Creatinine Ratio: 17 (ref 12–28)
BUN: 20 mg/dL (ref 8–27)
Bilirubin Total: 0.4 mg/dL (ref 0.0–1.2)
CO2: 20 mmol/L (ref 20–29)
Calcium: 10 mg/dL (ref 8.7–10.3)
Chloride: 101 mmol/L (ref 96–106)
Creatinine, Ser: 1.15 mg/dL — ABNORMAL HIGH (ref 0.57–1.00)
GFR calc Af Amer: 59 mL/min/{1.73_m2} — ABNORMAL LOW (ref >59)
GFR calc non Af Amer: 51 mL/min/{1.73_m2} — ABNORMAL LOW (ref >59)
Globulin, Total: 2.6 g/dL (ref 1.5–4.5)
Sodium: 140 mmol/L (ref 134–144)
Total Protein: 7.5 g/dL (ref 6.0–8.5)

## 2019-02-18 LAB — THYROID PANEL WITH TSH
Free Thyroxine Index: 1.8 (ref 1.2–4.9)
T3 Uptake Ratio: 29 % (ref 24–39)
T4, Total: 6.3 ug/dL (ref 4.5–12.0)
TSH: 3.53 u[IU]/mL (ref 0.450–4.500)

## 2019-02-27 DIAGNOSIS — E1129 Type 2 diabetes mellitus with other diabetic kidney complication: Secondary | ICD-10-CM | POA: Diagnosis not present

## 2019-02-27 DIAGNOSIS — F5101 Primary insomnia: Secondary | ICD-10-CM | POA: Diagnosis not present

## 2019-02-27 DIAGNOSIS — E785 Hyperlipidemia, unspecified: Secondary | ICD-10-CM | POA: Diagnosis not present

## 2019-02-27 DIAGNOSIS — Z23 Encounter for immunization: Secondary | ICD-10-CM | POA: Diagnosis not present

## 2019-02-27 DIAGNOSIS — E039 Hypothyroidism, unspecified: Secondary | ICD-10-CM | POA: Diagnosis not present

## 2019-02-27 DIAGNOSIS — F419 Anxiety disorder, unspecified: Secondary | ICD-10-CM | POA: Diagnosis not present

## 2019-02-27 DIAGNOSIS — F3341 Major depressive disorder, recurrent, in partial remission: Secondary | ICD-10-CM | POA: Diagnosis not present

## 2019-02-27 DIAGNOSIS — F410 Panic disorder [episodic paroxysmal anxiety] without agoraphobia: Secondary | ICD-10-CM | POA: Diagnosis not present

## 2019-02-27 DIAGNOSIS — I10 Essential (primary) hypertension: Secondary | ICD-10-CM | POA: Diagnosis not present

## 2019-08-22 ENCOUNTER — Ambulatory Visit: Payer: Medicare HMO | Admitting: Nurse Practitioner

## 2019-08-29 ENCOUNTER — Encounter: Payer: Self-pay | Admitting: Nurse Practitioner

## 2019-08-29 ENCOUNTER — Other Ambulatory Visit: Payer: Self-pay

## 2019-08-29 ENCOUNTER — Ambulatory Visit (INDEPENDENT_AMBULATORY_CARE_PROVIDER_SITE_OTHER): Payer: Medicare HMO | Admitting: Nurse Practitioner

## 2019-08-29 VITALS — BP 160/88 | HR 80 | Temp 98.4°F | Resp 20 | Ht 62.0 in | Wt 206.0 lb

## 2019-08-29 DIAGNOSIS — I10 Essential (primary) hypertension: Secondary | ICD-10-CM | POA: Diagnosis not present

## 2019-08-29 DIAGNOSIS — E1129 Type 2 diabetes mellitus with other diabetic kidney complication: Secondary | ICD-10-CM | POA: Diagnosis not present

## 2019-08-29 DIAGNOSIS — R809 Proteinuria, unspecified: Secondary | ICD-10-CM | POA: Diagnosis not present

## 2019-08-29 DIAGNOSIS — F3341 Major depressive disorder, recurrent, in partial remission: Secondary | ICD-10-CM

## 2019-08-29 DIAGNOSIS — F419 Anxiety disorder, unspecified: Secondary | ICD-10-CM

## 2019-08-29 DIAGNOSIS — F5101 Primary insomnia: Secondary | ICD-10-CM | POA: Diagnosis not present

## 2019-08-29 DIAGNOSIS — Z6833 Body mass index (BMI) 33.0-33.9, adult: Secondary | ICD-10-CM | POA: Diagnosis not present

## 2019-08-29 DIAGNOSIS — E785 Hyperlipidemia, unspecified: Secondary | ICD-10-CM | POA: Diagnosis not present

## 2019-08-29 DIAGNOSIS — E039 Hypothyroidism, unspecified: Secondary | ICD-10-CM

## 2019-08-29 LAB — BAYER DCA HB A1C WAIVED: HB A1C (BAYER DCA - WAIVED): 6.4 % (ref <7.0)

## 2019-08-29 MED ORDER — LOSARTAN POTASSIUM 100 MG PO TABS: 100.0000 mg | ORAL_TABLET | Freq: Every day | ORAL | 1 refills | Status: DC

## 2019-08-29 MED ORDER — FENOFIBRATE 160 MG PO TABS: 160.0000 mg | ORAL_TABLET | Freq: Every day | ORAL | 1 refills | Status: DC

## 2019-08-29 MED ORDER — LEVOTHYROXINE SODIUM 75 MCG PO TABS: ORAL_TABLET | ORAL | 1 refills | Status: DC

## 2019-08-29 MED ORDER — ALPRAZOLAM 0.5 MG PO TABS: 0.5000 mg | ORAL_TABLET | Freq: Two times a day (BID) | ORAL | 1 refills | Status: DC

## 2019-08-29 NOTE — Progress Notes (Signed)
Subjective:    Patient ID: [name], female    DOB: 1955/06/29, 64 y.o.   MRN: [id number]   Chief Complaint: medical management of chronic issues   HPI:  1. Essential hypertension No c/o chest pain, sob or headache. doesnot check bloodpressure at home. BP Readings from Last 3 Encounters:  02/17/19 136/85  11/28/18 140/81  08/15/18 (!) 152/82     2. Hyperlipidemia with target LDL less than 100 Does not watch diet and does no dedicated exercise. Lab Results  Component Value Date   CHOL 208 (H) 02/17/2019   HDL 37 (L) 02/17/2019   LDLCALC 136 (H) 02/17/2019   TRIG 192 (H) 02/17/2019   CHOLHDL 5.6 (H) 02/17/2019     3. Acquired hypothyroidism No problems that she is aware of  4. Type 2 diabetes mellitus with microalbuminuria, without long-term current use of insulin (HCC)has not been checking blood usgars. Says that sh ewatches diet,. Does no exercise. Lab Results  Component Value Date   HGBA1C 5.7 02/17/2019     5. Anxiety Stays anxious. Is on anxious 2x a day GAD 7 : Generalized Anxiety Score 08/29/2019 02/17/2019 10/07/2017  Nervous, Anxious, on Edge 3 3 3   Control/stop worrying 2 2 3   Worry too much - different things 2 2 2   Trouble relaxing 2 2 2   Restless 0 0 3  Easily annoyed or irritable 0 0 1  Afraid - awful might happen 3 0 2  Total GAD 7 Score 12 9 16   Anxiety Difficulty Not difficult at all Somewhat difficult Somewhat difficult      6. Recurrent major depressive disorder, in partial remission ([place]) Is currently not on an antidepressant Depression screen [facility] 2/9 08/29/2019 02/17/2019 08/15/2018  Decreased Interest 0 0 0  Down, Depressed, Hopeless 0 0 2  PHQ - 2 Score 0 0 2  Altered sleeping - - 3  Tired, decreased energy - - 2  Change in appetite - - 2  Feeling bad or failure about yourself  - - 0  Trouble concentrating - - 0  Moving slowly or fidgety/restless - - 0  Suicidal thoughts - - 0  PHQ-9 Score - - 9  Some recent data might be  hidden     7. Primary insomnia She sleeps well when her husband sleeps. She is not taking ambien anymore. Says she feels fine.  8. BMI 33.0-33.9,adult No recent weight changes Wt Readings from Last 3 Encounters:  08/29/19 206 lb (93.4 kg)  02/17/19 200 lb (90.7 kg)  11/28/18 204 lb (92.5 kg)   BMI Readings from Last 3 Encounters:  08/29/19 37.68 kg/m  02/17/19 36.58 kg/m  11/28/18 36.72 kg/m       Outpatient Encounter Medications as of 08/29/2019  Medication Sig  . albuterol (PROVENTIL HFA;VENTOLIN HFA) 108 (90 Base) MCG/ACT inhaler Inhale 2 puffs into the lungs every 6 (six) hours as needed for wheezing or shortness of breath.  . ALPRAZolam (XANAX) 0.5 MG tablet Take 1 tablet (0.5 mg total) by mouth 2 (two) times daily.  Marland Kitchen Apple Cid Vn-Grn Tea-Bit Or-Cr (APPLE CIDER VINEGAR PLUS) TABS Take 1 tablet by mouth daily.   . Coenzyme Q10 100 MG capsule Take 100 mg by mouth daily.   . fenofibrate 160 MG tablet Take 1 tablet (160 mg total) by mouth daily.  . Flaxseed, Linseed, (FLAXSEED OIL) 1000 MG CAPS Take 1,000 mg by mouth daily.   Marland Kitchen HAWTHORNE BERRY PO Take 565 mg by mouth daily.   Marland Kitchen  INOSITOL PO Take 1 capsule by mouth daily.  Marland Kitchen levothyroxine (SYNTHROID) 75 MCG tablet TAKE 1 TABLET EVERY DAY BEFORE BREAKFAST  . losartan (COZAAR) 100 MG tablet Take 1 tablet (100 mg total) by mouth daily.  Marland Kitchen Lysine 500 MG CAPS Take 500 mg by mouth 2 (two) times a week.   . Milk Thistle 250 MG CAPS Take 250 mg by mouth daily.   . mupirocin ointment (BACTROBAN) 2 % Place 1 application into the nose 2 (two) times daily.  . Omega-3 Fatty Acids (FISH OIL) 1000 MG CAPS Take 1,000 mg by mouth daily.   Marland Kitchen OVER THE COUNTER MEDICATION Take 1 capsule by mouth 2 (two) times daily. Liva Plex Supplement  . OVER THE COUNTER MEDICATION Take 3 tablets by mouth 3 (three) times daily. Cata Plex Supplement  . silver sulfADIAZINE (SILVADENE) 1 % cream Apply 3-4 times daily  . sodium fluoride (DENTAGEL) 1.1 % GEL  dental gel Place 1 application onto teeth at bedtime.  . SUPER B COMPLEX/C PO Take 1 capsule by mouth daily.  Marland Kitchen zolpidem (AMBIEN) 5 MG tablet Take 2 tablets (10 mg total) by mouth at bedtime.     Past Surgical History:  Procedure Laterality Date  . COLON RESECTION    . KNEE SURGERY    . oral surgery tooth extraction    . TONSILLECTOMY    . VAGINAL HYSTERECTOMY    . VULVECTOMY PARTIAL Bilateral 03/30/2018   Procedure: LASER PARTIAL VULVECTOMY FOR VULVUL INTRAEPITHELIA NEOPLASIS III;  Surgeon: Florian Buff, MD;  Location: [facility];  Service: Gynecology;  Laterality: Bilateral;    Family History  Problem Relation Age of Onset  . Cancer Mother 3       breast   . Heart attack Father   . Heart failure Father   . Cancer Maternal Aunt 40       pancreas  . 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  Eyes: Negative for pain.  Respiratory: Negative for shortness of breath.   Cardiovascular: Negative for chest pain, palpitations and leg swelling.  Gastrointestinal: Negative for abdominal pain.  Endocrine: Negative for polydipsia.  Skin: Negative for rash.  Neurological: Negative for dizziness, weakness and headaches.  Hematological: Does not bruise/bleed easily.  All other systems reviewed and are negative.      Objective:   Physical Exam Vitals and nursing note reviewed.  Constitutional:      General: She is not in acute distress.    Appearance: Normal appearance. She is well-developed.  HENT:     Head: Normocephalic.     Nose: Nose normal.  Eyes:     Pupils: Pupils are equal, round, and reactive to light.  Neck:     Vascular: No carotid bruit or JVD.  Cardiovascular:     Rate and Rhythm: Normal rate and regular rhythm.     Heart sounds: Normal heart sounds.    Pulmonary:     Effort: Pulmonary effort is normal. No respiratory distress.     Breath sounds: Normal breath sounds. No wheezing or rales.  Chest:     Chest wall: No tenderness.  Abdominal:     General: Bowel sounds are normal. There is no distension or abdominal bruit.     Palpations: Abdomen is soft. There is  no hepatomegaly, splenomegaly, mass or pulsatile mass.     Tenderness: There is no abdominal tenderness.  Musculoskeletal:        General: Normal range of motion.     Cervical back: Normal range of motion and neck supple.  Lymphadenopathy:     Cervical: No cervical adenopathy.  Skin:    General: Skin is warm and dry.  Neurological:     Mental Status: She is alert and oriented to person, place, and time.     Deep Tendon Reflexes: Reflexes are normal and symmetric.  Psychiatric:        Behavior: Behavior normal.        Thought Content: Thought content normal.        Judgment: Judgment normal.    BP (!) 160/88   Pulse 80   Temp 98.4 F (36.9 C) (Temporal)   Resp 20   Ht 5' 2"  (1.575 m)   Wt 206 lb (93.4 kg)   SpO2 96%   BMI 37.68 kg/m         Assessment & Plan:  [name] comes in today with chief complaint of Medical Management of Chronic Issues   Diagnosis and orders addressed:  1. Essential hypertension Low sodium diet - losartan (COZAAR) 100 MG tablet; Take 1 tablet (100 mg total) by mouth daily.  Dispense: 90 tablet; Refill: 1 - CMP14+EGFR - CBC with Differential/Platelet  2. Hyperlipidemia with target LDL less than 100 Low fat diet - fenofibrate 160 MG tablet; Take 1 tablet (160 mg total) by mouth daily.  Dispense: 90 tablet; Refill: 1 - Lipid panel  3. Acquired hypothyroidism - levothyroxine (SYNTHROID) 75 MCG tablet; TAKE 1 TABLET EVERY DAY BEFORE BREAKFAST  Dispense: 90 tablet; Refill: 1 - Thyroid Panel With TSH  4. Type 2 diabetes mellitus with microalbuminuria, without long-term current use of insulin (HCC) Watch carbs in diet -  Bayer DCA Hb A1c Waived  5. Anxiety Stress management - ALPRAZolam (XANAX) 0.5 MG tablet; Take 1 tablet (0.5 mg total) by mouth 2 (two) times daily.  Dispense: 180 tablet; Refill: 1  6. Recurrent major depressive disorder, in partial remission ([place])  7. Primary insomnia Bedtime routine  8. BMI 33.0-33.9,adult Discussed diet and exercise for person with BMI >25 Will recheck weight in 3-6 months   Labs pending Health Maintenance reviewed Diet and exercise encouraged  Follow up plan: 6 months   [name] Done, FNP

## 2019-08-29 NOTE — Patient Instructions (Signed)
Carbohydrate Counting for Diabetes Mellitus, Adult  Carbohydrate counting is a method of keeping track of how many carbohydrates you eat. Eating carbohydrates naturally increases the amount of sugar (glucose) in the blood. Counting how many carbohydrates you eat helps keep your blood glucose within normal limits, which helps you manage your diabetes (diabetes mellitus). It is important to know how many carbohydrates you can safely have in each meal. This is different for every person. A diet and nutrition specialist (registered dietitian) can help you make a meal plan and calculate how many carbohydrates you should have at each meal and snack. Carbohydrates are found in the following foods:  Grains, such as breads and cereals.  Dried beans and soy products.  Starchy vegetables, such as potatoes, peas, and corn.  Fruit and fruit juices.  Milk and yogurt.  Sweets and snack foods, such as cake, cookies, candy, chips, and soft drinks. How do I count carbohydrates? There are two ways to count carbohydrates in food. You can use either of the methods or a combination of both. Reading "Nutrition Facts" on packaged food The "Nutrition Facts" list is included on the labels of almost all packaged foods and beverages in the [place] It includes:  The serving size.  Information about nutrients in each serving, including the grams (g) of carbohydrate per serving. To use the "Nutrition Facts":  Decide how many servings you will have.  Multiply the number of servings by the number of carbohydrates per serving.  The resulting number is the total amount of carbohydrates that you will be having. Learning standard serving sizes of other foods When you eat carbohydrate foods that are not packaged or do not include "Nutrition Facts" on the label, you need to measure the servings in order to count the amount of carbohydrates:  Measure the foods that you will eat with a food scale or measuring cup, if  needed.  Decide how many standard-size servings you will eat.  Multiply the number of servings by 15. Most carbohydrate-rich foods have about 15 g of carbohydrates per serving. ? For example, if you eat 8 oz (170 g) of strawberries, you will have eaten 2 servings and 30 g of carbohydrates (2 servings x 15 g = 30 g).  For foods that have more than one food mixed, such as soups and casseroles, you must count the carbohydrates in each food that is included. The following list contains standard serving sizes of common carbohydrate-rich foods. Each of these servings has about 15 g of carbohydrates:   hamburger bun or  English muffin.   oz (15 mL) syrup.   oz (14 g) jelly.  1 slice of bread.  1 six-inch tortilla.  3 oz (85 g) cooked rice or pasta.  4 oz (113 g) cooked dried beans.  4 oz (113 g) starchy vegetable, such as peas, corn, or potatoes.  4 oz (113 g) hot cereal.  4 oz (113 g) mashed potatoes or  of a large baked potato.  4 oz (113 g) canned or frozen fruit.  4 oz (120 mL) fruit juice.  4-6 crackers.  6 chicken nuggets.  6 oz (170 g) unsweetened dry cereal.  6 oz (170 g) plain fat-free yogurt or yogurt sweetened with artificial sweeteners.  8 oz (240 mL) milk.  8 oz (170 g) fresh fruit or one small piece of fruit.  24 oz (680 g) popped popcorn. Example of carbohydrate counting Sample meal  3 oz (85 g) chicken breast.  6 oz (170 g)   brown rice.  4 oz (113 g) corn.  8 oz (240 mL) milk.  8 oz (170 g) strawberries with sugar-free whipped topping. Carbohydrate calculation 1. Identify the foods that contain carbohydrates: ? Rice. ? Corn. ? Milk. ? Strawberries. 2. Calculate how many servings you have of each food: ? 2 servings rice. ? 1 serving corn. ? 1 serving milk. ? 1 serving strawberries. 3. Multiply each number of servings by 15 g: ? 2 servings rice x 15 g = 30 g. ? 1 serving corn x 15 g = 15 g. ? 1 serving milk x 15 g = 15 g. ? 1  serving strawberries x 15 g = 15 g. 4. Add together all of the amounts to find the total grams of carbohydrates eaten: ? 30 g + 15 g + 15 g + 15 g = 75 g of carbohydrates total. Summary  Carbohydrate counting is a method of keeping track of how many carbohydrates you eat.  Eating carbohydrates naturally increases the amount of sugar (glucose) in the blood.  Counting how many carbohydrates you eat helps keep your blood glucose within normal limits, which helps you manage your diabetes.  A diet and nutrition specialist (registered dietitian) can help you make a meal plan and calculate how many carbohydrates you should have at each meal and snack. This information is not intended to replace advice given to you by your health care provider. Make sure you discuss any questions you have with your health care provider. Document Revised: 12/10/2016 Document Reviewed: 10/30/2015 Elsevier Patient Education  2020 Elsevier Inc.  

## 2019-08-30 LAB — CBC WITH DIFFERENTIAL/PLATELET
Basophils Absolute: 0 10*3/uL (ref 0.0–0.2)
Basos: 1 %
EOS (ABSOLUTE): 0.1 10*3/uL (ref 0.0–0.4)
Eos: 2 %
Hematocrit: 40.5 % (ref 34.0–46.6)
Hemoglobin: 13.9 g/dL (ref 11.1–15.9)
Immature Grans (Abs): 0 10*3/uL (ref 0.0–0.1)
Immature Granulocytes: 0 %
Lymphocytes Absolute: 1.2 10*3/uL (ref 0.7–3.1)
Lymphs: 14 %
MCH: 30.4 pg (ref 26.6–33.0)
MCHC: 34.3 g/dL (ref 31.5–35.7)
MCV: 89 fL (ref 79–97)
Monocytes Absolute: 0.5 10*3/uL (ref 0.1–0.9)
Monocytes: 6 %
Neutrophils Absolute: 6.3 10*3/uL (ref 1.4–7.0)
Neutrophils: 77 %
Platelets: 220 10*3/uL (ref 150–450)
RBC: 4.57 x10E6/uL (ref 3.77–5.28)
RDW: 13.1 % (ref 11.7–15.4)
WBC: 8.1 10*3/uL (ref 3.4–10.8)

## 2019-08-30 LAB — LIPID PANEL
Chol/HDL Ratio: 5.8 ratio — ABNORMAL HIGH (ref 0.0–4.4)
Cholesterol, Total: 207 mg/dL — ABNORMAL HIGH (ref 100–199)
HDL: 36 mg/dL — ABNORMAL LOW (ref >39)
LDL Chol Calc (NIH): 133 mg/dL — ABNORMAL HIGH (ref 0–99)
Triglycerides: 209 mg/dL — ABNORMAL HIGH (ref 0–149)
VLDL Cholesterol Cal: 38 mg/dL (ref 5–40)

## 2019-08-30 LAB — CMP14+EGFR
ALT: 23 IU/L (ref 0–32)
AST: 17 IU/L (ref 0–40)
Albumin/Globulin Ratio: 2.5 — ABNORMAL HIGH (ref 1.2–2.2)
Albumin: 4.9 g/dL — ABNORMAL HIGH (ref 3.8–4.8)
Alkaline Phosphatase: 56 IU/L (ref 39–117)
BUN/Creatinine Ratio: 17 (ref 12–28)
BUN: 18 mg/dL (ref 8–27)
Bilirubin Total: 0.4 mg/dL (ref 0.0–1.2)
CO2: 21 mmol/L (ref 20–29)
Calcium: 9.7 mg/dL (ref 8.7–10.3)
Chloride: 103 mmol/L (ref 96–106)
Creatinine, Ser: 1.05 mg/dL — ABNORMAL HIGH (ref 0.57–1.00)
GFR calc Af Amer: 65 mL/min/{1.73_m2} (ref >59)
GFR calc non Af Amer: 56 mL/min/{1.73_m2} — ABNORMAL LOW (ref >59)
Globulin, Total: 2 g/dL (ref 1.5–4.5)
Glucose: 105 mg/dL — ABNORMAL HIGH (ref 65–99)
Potassium: 4.2 mmol/L (ref 3.5–5.2)
Sodium: 138 mmol/L (ref 134–144)
Total Protein: 6.9 g/dL (ref 6.0–8.5)

## 2019-08-30 LAB — THYROID PANEL WITH TSH
Free Thyroxine Index: 1.9 (ref 1.2–4.9)
T3 Uptake Ratio: 28 % (ref 24–39)
T4, Total: 6.7 ug/dL (ref 4.5–12.0)
TSH: 3.05 u[IU]/mL (ref 0.450–4.500)

## 2019-11-21 ENCOUNTER — Telehealth: Payer: Self-pay | Admitting: Nurse Practitioner

## 2019-11-21 NOTE — Telephone Encounter (Signed)
LVM to call to schedule diabetic eye exam.

## 2019-12-01 ENCOUNTER — Ambulatory Visit (INDEPENDENT_AMBULATORY_CARE_PROVIDER_SITE_OTHER): Payer: Medicare HMO | Admitting: Nurse Practitioner

## 2019-12-01 ENCOUNTER — Other Ambulatory Visit: Payer: Self-pay

## 2019-12-01 ENCOUNTER — Encounter: Payer: Self-pay | Admitting: Nurse Practitioner

## 2019-12-01 VITALS — BP 147/94 | HR 87 | Temp 96.9°F | Resp 20 | Ht 62.0 in | Wt 201.0 lb

## 2019-12-01 DIAGNOSIS — E785 Hyperlipidemia, unspecified: Secondary | ICD-10-CM

## 2019-12-01 DIAGNOSIS — F5101 Primary insomnia: Secondary | ICD-10-CM

## 2019-12-01 DIAGNOSIS — R809 Proteinuria, unspecified: Secondary | ICD-10-CM | POA: Diagnosis not present

## 2019-12-01 DIAGNOSIS — F3341 Major depressive disorder, recurrent, in partial remission: Secondary | ICD-10-CM | POA: Diagnosis not present

## 2019-12-01 DIAGNOSIS — E039 Hypothyroidism, unspecified: Secondary | ICD-10-CM | POA: Diagnosis not present

## 2019-12-01 DIAGNOSIS — I10 Essential (primary) hypertension: Secondary | ICD-10-CM

## 2019-12-01 DIAGNOSIS — T466X5A Adverse effect of antihyperlipidemic and antiarteriosclerotic drugs, initial encounter: Secondary | ICD-10-CM

## 2019-12-01 DIAGNOSIS — E1129 Type 2 diabetes mellitus with other diabetic kidney complication: Secondary | ICD-10-CM | POA: Diagnosis not present

## 2019-12-01 DIAGNOSIS — F410 Panic disorder [episodic paroxysmal anxiety] without agoraphobia: Secondary | ICD-10-CM | POA: Diagnosis not present

## 2019-12-01 DIAGNOSIS — F419 Anxiety disorder, unspecified: Secondary | ICD-10-CM | POA: Diagnosis not present

## 2019-12-01 DIAGNOSIS — M791 Myalgia, unspecified site: Secondary | ICD-10-CM

## 2019-12-01 DIAGNOSIS — Z6833 Body mass index (BMI) 33.0-33.9, adult: Secondary | ICD-10-CM

## 2019-12-01 LAB — BAYER DCA HB A1C WAIVED: HB A1C (BAYER DCA - WAIVED): 5.9 % (ref <7.0)

## 2019-12-01 MED ORDER — LOSARTAN POTASSIUM 100 MG PO TABS: 100.0000 mg | ORAL_TABLET | Freq: Every day | ORAL | 1 refills | Status: DC

## 2019-12-01 MED ORDER — LEVOTHYROXINE SODIUM 75 MCG PO TABS: ORAL_TABLET | ORAL | 1 refills | Status: DC

## 2019-12-01 MED ORDER — FENOFIBRATE 160 MG PO TABS: 160.0000 mg | ORAL_TABLET | Freq: Every day | ORAL | 1 refills | Status: DC

## 2019-12-01 MED ORDER — ALPRAZOLAM 0.5 MG PO TABS: 0.5000 mg | ORAL_TABLET | Freq: Two times a day (BID) | ORAL | 1 refills | Status: DC

## 2019-12-01 NOTE — Progress Notes (Signed)
Subjective:    Patient ID: [name], female    DOB: 01/07/1956, 64 y.o.   MRN: [id number]   Chief Complaint: Medical Management of Chronic Issues    HPI:  1. Essential hypertension No c/o chest pain, sob or headache. Does not check blood pressure at home. BP Readings from Last 3 Encounters:  12/01/19 (!) 147/94  08/29/19 (!) 160/88  02/17/19 136/85     2. Hyperlipidemia with target LDL less than 100 Does ot watch diet and does no exercise. refuses to take a statin Lab Results  Component Value Date   CHOL 207 (H) 08/29/2019   HDL 36 (L) 08/29/2019   LDLCALC 133 (H) 08/29/2019   TRIG 209 (H) 08/29/2019   CHOLHDL 5.8 (H) 08/29/2019     3. Type 2 diabetes mellitus with microalbuminuria, without long-term current use of insulin (HCC) Fasting blood sugars are not checked at home. She feels fine. Is currently doing diet control. Lab Results  Component Value Date   HGBA1C 6.4 08/29/2019     4. Acquired hypothyroidism No problems that aware of  5. Anxiety Stays anxious. Takes xana BID- cannot function without. GAD 7 : Generalized Anxiety Score 08/29/2019 02/17/2019 10/07/2017  Nervous, Anxious, on Edge 3 3 3   Control/stop worrying 2 2 3   Worry too much - different things 2 2 2   Trouble relaxing 2 2 2   Restless 0 0 3  Easily annoyed or irritable 0 0 1  Afraid - awful might happen 3 0 2  Total GAD 7 Score 12 9 16   Anxiety Difficulty Not difficult at all Somewhat difficult Somewhat difficult      6. Panic attacks She has had a few panic attacks since last visit. Last several hours, but usually related to going to the doctor or dentist.  7. Recurrent major depressive disorder, in partial remission ([place]) Is currently not on antidepressant. Depression screen [facility] 2/9 12/01/2019 12/01/2019 08/29/2019  Decreased Interest 0 0 0  Down, Depressed, Hopeless 0 0 0  PHQ - 2 Score 0 0 0  Altered sleeping 0 - -  Tired, decreased energy 0 - -  Change in appetite 0 - -    Feeling bad or failure about yourself  0 - -  Trouble concentrating 0 - -  Moving slowly or fidgety/restless 0 - -  Suicidal thoughts 0 - -  PHQ-9 Score 0 - -  Difficult doing work/chores Not difficult at all - -  Some recent data might be hidden     8. Primary insomnia Has trouble sleeping. Usually takes xanax at bedtime for her anxiety and to help her sleep.  9. BMI 33.0-33.9,adult No recent weight chnages Wt Readings from Last 3 Encounters:  12/01/19 201 lb (91.2 kg)  08/29/19 206 lb (93.4 kg)  02/17/19 200 lb (90.7 kg)   BMI Readings from Last 3 Encounters:  12/01/19 36.76 kg/m  08/29/19 37.68 kg/m  02/17/19 36.58 kg/m       Outpatient Encounter Medications as of 12/01/2019  Medication Sig  . ALPRAZolam (XANAX) 0.5 MG tablet Take 1 tablet (0.5 mg total) by mouth 2 (two) times daily.  Marland Kitchen Apple Cid Vn-Grn Tea-Bit Or-Cr (APPLE CIDER VINEGAR PLUS) TABS Take 1 tablet by mouth daily.   . Coenzyme Q10 100 MG capsule Take 100 mg by mouth daily.   . fenofibrate 160 MG tablet Take 1 tablet (160 mg total) by mouth daily.  . Flaxseed, Linseed, (FLAXSEED OIL) 1000 MG CAPS Take 1,000 mg by mouth  daily.   Marland Kitchen HAWTHORNE BERRY PO Take 565 mg by mouth daily.   . INOSITOL PO Take 1 capsule by mouth daily.  Marland Kitchen levothyroxine (SYNTHROID) 75 MCG tablet TAKE 1 TABLET EVERY DAY BEFORE BREAKFAST  . losartan (COZAAR) 100 MG tablet Take 1 tablet (100 mg total) by mouth daily.  Marland Kitchen Lysine 500 MG CAPS Take 500 mg by mouth 2 (two) times a week.   . Milk Thistle 250 MG CAPS Take 250 mg by mouth daily.   . mupirocin ointment (BACTROBAN) 2 % Place 1 application into the nose 2 (two) times daily.  Marland Kitchen OVER THE COUNTER MEDICATION Take 1 capsule by mouth 2 (two) times daily. Liva Plex Supplement  . OVER THE COUNTER MEDICATION Take 3 tablets by mouth 3 (three) times daily. Cata Plex Supplement  . silver sulfADIAZINE (SILVADENE) 1 % cream Apply 3-4 times daily  . sodium fluoride (DENTAGEL) 1.1 % GEL dental gel  Place 1 application onto teeth at bedtime.  . SUPER B COMPLEX/C PO Take 1 capsule by mouth daily.    Past Surgical History:  Procedure Laterality Date  . COLON RESECTION    . KNEE SURGERY    . oral surgery tooth extraction    . TONSILLECTOMY    . VAGINAL HYSTERECTOMY    . VULVECTOMY PARTIAL Bilateral 03/30/2018   Procedure: LASER PARTIAL VULVECTOMY FOR VULVUL INTRAEPITHELIA NEOPLASIS III;  Surgeon: Florian Buff, MD;  Location: [facility];  Service: Gynecology;  Laterality: Bilateral;    Family History  Problem Relation Age of Onset  . Cancer Mother 37       breast   . Heart attack Father   . Heart failure Father   . Cancer Maternal Aunt 40       pancreas  . Cancer Maternal Grandfather        lung    New complaints: None new  Social history: Lives with husband who has sciizophrenia  Controlled substance contract: *n/a**    Review of Systems  Constitutional: Negative for diaphoresis.  Eyes: Negative for pain.  Respiratory: Negative for shortness of breath.   Cardiovascular: Negative for chest pain, palpitations and leg swelling.  Gastrointestinal: Negative for abdominal pain.  Endocrine: Negative for polydipsia.  Skin: Negative for rash.  Neurological: Negative for dizziness, weakness and headaches.  Hematological: Does not bruise/bleed easily.  Psychiatric/Behavioral: The patient is nervous/anxious.   All other systems reviewed and are negative.      Objective:   Physical Exam Vitals and nursing note reviewed.  Constitutional:      General: She is not in acute distress.    Appearance: Normal appearance. She is well-developed.  HENT:     Head: Normocephalic.     Nose: Nose normal.  Eyes:     Pupils: Pupils are equal, round, and reactive to light.  Neck:     Vascular: No carotid bruit or JVD.  Cardiovascular:     Rate and Rhythm: Normal rate and regular rhythm.     Heart sounds: Normal heart sounds.  Pulmonary:     Effort: Pulmonary effort is normal. No  respiratory distress.     Breath sounds: Normal breath sounds. No wheezing or rales.  Chest:     Chest wall: No tenderness.  Abdominal:     General: Bowel sounds are normal. There is no distension or abdominal bruit.     Palpations: Abdomen is soft. There is no hepatomegaly, splenomegaly, mass or pulsatile mass.     Tenderness: There is no  abdominal tenderness.  Musculoskeletal:        General: Normal range of motion.     Cervical back: Normal range of motion and neck supple.  Lymphadenopathy:     Cervical: No cervical adenopathy.  Skin:    General: Skin is warm and dry.  Neurological:     Mental Status: She is alert and oriented to person, place, and time.     Deep Tendon Reflexes: Reflexes are normal and symmetric.  Psychiatric:        Behavior: Behavior normal.        Thought Content: Thought content normal.        Judgment: Judgment normal.    BP (!) 147/94   Pulse 87   Temp (!) 96.9 F (36.1 C) (Temporal)   Resp 20   Ht 5' 2"  (1.575 m)   Wt 201 lb (91.2 kg)   SpO2 91%   BMI 36.76 kg/m         Assessment & Plan:  FARRIN SHADLE comes in today with chief complaint of Medical Management of Chronic Issues   Diagnosis and orders addressed:  1. Essential hypertension Low sodium diet - losartan (COZAAR) 100 MG tablet; Take 1 tablet (100 mg total) by mouth daily.  Dispense: 90 tablet; Refill: 1 - CBC with Differential/Platelet - CMP14+EGFR  2. Hyperlipidemia with target LDL less than 100 Low fat diet - fenofibrate 160 MG tablet; Take 1 tablet (160 mg total) by mouth daily.  Dispense: 90 tablet; Refill: 1 - Lipid panel  3. Type 2 diabetes mellitus with microalbuminuria, without long-term current use of insulin (HCC) Continue  To watch carbs in diet - Bayer DCA Hb A1c Waived  4. Acquired hypothyroidism Labs pending - levothyroxine (SYNTHROID) 75 MCG tablet; TAKE 1 TABLET EVERY DAY BEFORE BREAKFAST  Dispense: 90 tablet; Refill: 1 - Thyroid Panel With TSH  5.  Anxiety Stress management - ALPRAZolam (XANAX) 0.5 MG tablet; Take 1 tablet (0.5 mg total) by mouth 2 (two) times daily.  Dispense: 180 tablet; Refill: 1  6. Panic attacks Stress management  7. Recurrent major depressive disorder, in partial remission ([place])  8. Primary insomnia Bedtime routine  9. BMI 33.0-33.9,adult Discussed diet and exercise for person with BMI >25 Will recheck weight in 3-6 months  10. Myalgia due to statin Refuses statin due to myalgia   Labs pending Health Maintenance reviewed Diet and exercise encouraged  Follow up plan: 6 months   [place], FNP

## 2019-12-02 LAB — CMP14+EGFR
ALT: 20 IU/L (ref 0–32)
AST: 18 IU/L (ref 0–40)
Albumin/Globulin Ratio: 2.1 (ref 1.2–2.2)
Albumin: 5 g/dL — ABNORMAL HIGH (ref 3.8–4.8)
Alkaline Phosphatase: 54 IU/L (ref 48–121)
BUN/Creatinine Ratio: 21 (ref 12–28)
BUN: 21 mg/dL (ref 8–27)
Bilirubin Total: 0.4 mg/dL (ref 0.0–1.2)
CO2: 21 mmol/L (ref 20–29)
Calcium: 9.9 mg/dL (ref 8.7–10.3)
Chloride: 103 mmol/L (ref 96–106)
Creatinine, Ser: 1.01 mg/dL — ABNORMAL HIGH (ref 0.57–1.00)
GFR calc Af Amer: 68 mL/min/{1.73_m2} (ref >59)
GFR calc non Af Amer: 59 mL/min/{1.73_m2} — ABNORMAL LOW (ref >59)
Globulin, Total: 2.4 g/dL (ref 1.5–4.5)
Glucose: 99 mg/dL (ref 65–99)
Potassium: 4.1 mmol/L (ref 3.5–5.2)
Sodium: 140 mmol/L (ref 134–144)
Total Protein: 7.4 g/dL (ref 6.0–8.5)

## 2019-12-02 LAB — LIPID PANEL
Chol/HDL Ratio: 5.9 ratio — ABNORMAL HIGH (ref 0.0–4.4)
Cholesterol, Total: 190 mg/dL (ref 100–199)
HDL: 32 mg/dL — ABNORMAL LOW (ref >39)
LDL Chol Calc (NIH): 129 mg/dL — ABNORMAL HIGH (ref 0–99)
Triglycerides: 161 mg/dL — ABNORMAL HIGH (ref 0–149)
VLDL Cholesterol Cal: 29 mg/dL (ref 5–40)

## 2019-12-02 LAB — THYROID PANEL WITH TSH
Free Thyroxine Index: 2.3 (ref 1.2–4.9)
T3 Uptake Ratio: 32 % (ref 24–39)
T4, Total: 7.3 ug/dL (ref 4.5–12.0)
TSH: 2.95 u[IU]/mL (ref 0.450–4.500)

## 2019-12-02 LAB — CBC WITH DIFFERENTIAL/PLATELET
Basophils Absolute: 0 10*3/uL (ref 0.0–0.2)
Basos: 0 %
EOS (ABSOLUTE): 0.2 10*3/uL (ref 0.0–0.4)
Eos: 3 %
Hematocrit: 40.5 % (ref 34.0–46.6)
Hemoglobin: 14 g/dL (ref 11.1–15.9)
Immature Grans (Abs): 0 10*3/uL (ref 0.0–0.1)
Immature Granulocytes: 0 %
Lymphocytes Absolute: 0.8 10*3/uL (ref 0.7–3.1)
Lymphs: 13 %
MCH: 30.5 pg (ref 26.6–33.0)
MCHC: 34.6 g/dL (ref 31.5–35.7)
MCV: 88 fL (ref 79–97)
Monocytes Absolute: 0.4 10*3/uL (ref 0.1–0.9)
Monocytes: 6 %
Neutrophils Absolute: 4.9 10*3/uL (ref 1.4–7.0)
Neutrophils: 78 %
Platelets: 216 10*3/uL (ref 150–450)
RBC: 4.59 x10E6/uL (ref 3.77–5.28)
RDW: 13.2 % (ref 11.7–15.4)
WBC: 6.4 10*3/uL (ref 3.4–10.8)

## 2019-12-07 ENCOUNTER — Encounter: Payer: Self-pay | Admitting: Nurse Practitioner

## 2019-12-07 DIAGNOSIS — Z5320 Procedure and treatment not carried out because of patient's decision for unspecified reasons: Secondary | ICD-10-CM | POA: Insufficient documentation

## 2019-12-21 ENCOUNTER — Other Ambulatory Visit: Payer: Self-pay | Admitting: Nurse Practitioner

## 2019-12-21 ENCOUNTER — Encounter: Payer: Self-pay | Admitting: Nurse Practitioner

## 2019-12-21 DIAGNOSIS — Z1212 Encounter for screening for malignant neoplasm of rectum: Secondary | ICD-10-CM

## 2019-12-25 ENCOUNTER — Other Ambulatory Visit: Payer: Self-pay

## 2019-12-25 DIAGNOSIS — Z0000 Encounter for general adult medical examination without abnormal findings: Secondary | ICD-10-CM

## 2020-01-12 ENCOUNTER — Ambulatory Visit: Payer: Medicare HMO | Admitting: Obstetrics & Gynecology

## 2020-01-12 ENCOUNTER — Encounter: Payer: Self-pay | Admitting: Obstetrics & Gynecology

## 2020-01-12 VITALS — BP 169/94 | HR 73 | Ht 62.2 in | Wt 204.8 lb

## 2020-01-12 DIAGNOSIS — D071 Carcinoma in situ of vulva: Secondary | ICD-10-CM | POA: Diagnosis not present

## 2020-01-12 NOTE — Progress Notes (Signed)
Chief Complaint  Patient presents with  . Follow-up    vulvectomy      64 y.o. G1P0 No LMP recorded. Patient has had a hysterectomy. The current method of family planning is status post hysterectomy.  Outpatient Encounter Medications as of 01/12/2020  Medication Sig  . ALPRAZolam (XANAX) 0.5 MG tablet Take 1 tablet (0.5 mg total) by mouth 2 (two) times daily.  Marland Kitchen Apple Cid Vn-Grn Tea-Bit Or-Cr (APPLE CIDER VINEGAR PLUS) TABS Take 1 tablet by mouth daily.   . Coenzyme Q10 100 MG capsule Take 100 mg by mouth daily.   . fenofibrate 160 MG tablet Take 1 tablet (160 mg total) by mouth daily.  . Flaxseed, Linseed, (FLAXSEED OIL) 1000 MG CAPS Take 1,000 mg by mouth daily.   Marland Kitchen HAWTHORNE BERRY PO Take 565 mg by mouth daily.   . INOSITOL PO Take 1 capsule by mouth daily.  Marland Kitchen levothyroxine (SYNTHROID) 75 MCG tablet TAKE 1 TABLET EVERY DAY BEFORE BREAKFAST  . losartan (COZAAR) 100 MG tablet Take 1 tablet (100 mg total) by mouth daily.  Marland Kitchen Lysine 500 MG CAPS Take 500 mg by mouth 2 (two) times a week.   . Milk Thistle 250 MG CAPS Take 250 mg by mouth daily.   . mupirocin ointment (BACTROBAN) 2 % Place 1 application into the nose 2 (two) times daily.  Marland Kitchen OVER THE COUNTER MEDICATION Take 1 capsule by mouth 2 (two) times daily. Liva Plex Supplement  . OVER THE COUNTER MEDICATION Take 3 tablets by mouth 3 (three) times daily. Cata Plex Supplement  . silver sulfADIAZINE (SILVADENE) 1 % cream Apply 3-4 times daily  . sodium fluoride (DENTAGEL) 1.1 % GEL dental gel Place 1 application onto teeth at bedtime.  . SUPER B COMPLEX/C PO Take 1 capsule by mouth daily.   No facility-administered encounter medications on file as of 01/12/2020.    Subjective Pt is nearly 2 years post op from laser vuvlvectomy for VINIII doin well no complaints  No further symtpoms Past Medical History:  Diagnosis Date  . Allergy    sulfa  . Anxiety 03/09/2012  . 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 Chronic hoarseness    years  . Dysphagia   . Hypertension   . Hypertriglyceridemia     Past Surgical History:  Procedure Laterality Date  . COLON RESECTION    . KNEE SURGERY    . oral surgery tooth extraction    . TONSILLECTOMY    . VAGINAL HYSTERECTOMY    . VULVECTOMY PARTIAL Bilateral 03/30/2018   Procedure: LASER PARTIAL VULVECTOMY FOR VULVUL INTRAEPITHELIA NEOPLASIS III;  Surgeon: Florian Buff, MD;  Location: [facility];  Service: Gynecology;  Laterality: Bilateral;    OB History    Gravida  1   Para      Term      Preterm      AB      Living  1     SAB      TAB      Ectopic      Multiple      Live Births  1           Allergies  Allergen Reactions  . Azithromycin Diarrhea  . Sulfa Antibiotics Other (See Comments)    Unable to recall    Social History   Socioeconomic History  . Marital status: Married    Spouse name: Not on file  .  Number of children: 1  . Years of education: Not on file  . Highest education level: Not on file  Occupational History    Employer: [employer]  Tobacco Use  . Smoking status: Former Smoker    Packs/day: 0.25    Years: 40.00    Pack years: 10.00    Types: Cigarettes  . Smokeless tobacco: Never Used  Vaping Use  . Vaping Use: Never used  Substance and Sexual Activity  . Alcohol use: Yes  . Drug use: No  . Sexual activity: Not Currently    Birth control/protection: None  Other Topics Concern  . Not on file  Social History Narrative  . Not on file   Social Determinants of Health   Financial Resource Strain:   . Difficulty of Paying Living Expenses:   Food Insecurity:   . Worried About Charity fundraiser in the Last Year:   . Arboriculturist in the Last Year:   Transportation Needs:   . Film/video editor (Medical):   Marland Kitchen Lack of Transportation (Non-Medical):   Physical Activity:   . Days of Exercise per Week:   . Minutes of Exercise  per Session:   Stress:   . Feeling of Stress :   Social Connections:   . Frequency of Communication with Friends and Family:   . Frequency of Social Gatherings with Friends and Family:   . Attends Religious Services:   . Active Member of Clubs or Organizations:   . Attends Archivist Meetings:   Marland Kitchen Marital Status:     Family History  Problem Relation Age of Onset  . Cancer Mother 54       breast   . Heart attack Father   . Heart failure Father   . Cancer Maternal Aunt 40       pancreas  . Cancer Maternal Grandfather        lung    Medications:       Current Outpatient Medications:  .  ALPRAZolam (XANAX) 0.5 MG tablet, Take 1 tablet (0.5 mg total) by mouth 2 (two) times daily., Disp: 180 tablet, Rfl: 1 .  Apple Cid Vn-Grn Tea-Bit Or-Cr (APPLE CIDER VINEGAR PLUS) TABS, Take 1 tablet by mouth daily. , Disp: , Rfl:  .  Coenzyme Q10 100 MG capsule, Take 100 mg by mouth daily. , Disp: , Rfl:  .  fenofibrate 160 MG tablet, Take 1 tablet (160 mg total) by mouth daily., Disp: 90 tablet, Rfl: 1 .  Flaxseed, Linseed, (FLAXSEED OIL) 1000 MG CAPS, Take 1,000 mg by mouth daily. , Disp: , Rfl:  .  HAWTHORNE BERRY PO, Take 565 mg by mouth daily. , Disp: , Rfl:  .  INOSITOL PO, Take 1 capsule by mouth daily., Disp: , Rfl:  .  levothyroxine (SYNTHROID) 75 MCG tablet, TAKE 1 TABLET EVERY DAY BEFORE BREAKFAST, Disp: 90 tablet, Rfl: 1 .  losartan (COZAAR) 100 MG tablet, Take 1 tablet (100 mg total) by mouth daily., Disp: 90 tablet, Rfl: 1 .  Lysine 500 MG CAPS, Take 500 mg by mouth 2 (two) times a week. , Disp: , Rfl:  .  Milk Thistle 250 MG CAPS, Take 250 mg by mouth daily. , Disp: , Rfl:  .  mupirocin ointment (BACTROBAN) 2 %, Place 1 application into the nose 2 (two) times daily., Disp: 22 g, Rfl: 0 .  OVER THE COUNTER MEDICATION, Take 1 capsule by mouth 2 (two) times daily. Liva Plex Supplement,  Disp: , Rfl:  .  OVER THE COUNTER MEDICATION, Take 3 tablets by mouth 3 (three) times  daily. Cata Plex Supplement, Disp: , Rfl:  .  silver sulfADIAZINE (SILVADENE) 1 % cream, Apply 3-4 times daily, Disp: 50 g, Rfl: 11 .  sodium fluoride (DENTAGEL) 1.1 % GEL dental gel, Place 1 application onto teeth at bedtime., Disp: 120 mL, Rfl: 11 .  SUPER B COMPLEX/C PO, Take 1 capsule by mouth daily., Disp: , Rfl:   Objective Blood pressure (!) 169/94, pulse 73, height 5' 2.2" (1.58 m), weight 204 lb 12.8 oz (92.9 kg).  General WDWN female NAD Vulva:  normal appearing vulva with no masses, tenderness or lesions Vagina:  normal mucosa, no discharge   Pertinent ROS No burning with urination, frequency or urgency No nausea, vomiting or diarrhea Nor fever chills or other constitutional symptoms   Labs or studies     Impression Diagnoses this Encounter::   ICD-10-CM   1. VIN III (vulvar intraepithelial neoplasia III)  D07.1    S/P laser vulvectomy 03/2018 with NED    Established relevant diagnosis(es):   Plan/Recommendations: No orders of the defined types were placed in this encounter.   Labs or Scans Ordered: No orders of the defined types were placed in this encounter.   Management:: Follow up 1 year  Follow up No follow-ups on file.      All questions were answered.

## 2020-03-20 ENCOUNTER — Ambulatory Visit: Payer: Medicare HMO

## 2020-03-27 ENCOUNTER — Ambulatory Visit (INDEPENDENT_AMBULATORY_CARE_PROVIDER_SITE_OTHER): Payer: Medicare HMO

## 2020-03-27 ENCOUNTER — Other Ambulatory Visit: Payer: Self-pay

## 2020-03-27 DIAGNOSIS — Z23 Encounter for immunization: Secondary | ICD-10-CM | POA: Diagnosis not present

## 2020-04-06 LAB — HM DIABETES EYE EXAM

## 2020-04-08 DIAGNOSIS — Z0100 Encounter for examination of eyes and vision without abnormal findings: Secondary | ICD-10-CM | POA: Diagnosis not present

## 2020-04-23 DIAGNOSIS — Z86010 Personal history of colonic polyps: Secondary | ICD-10-CM | POA: Diagnosis not present

## 2020-04-23 DIAGNOSIS — D122 Benign neoplasm of ascending colon: Secondary | ICD-10-CM | POA: Diagnosis not present

## 2020-04-23 DIAGNOSIS — K635 Polyp of colon: Secondary | ICD-10-CM | POA: Diagnosis not present

## 2020-04-23 DIAGNOSIS — Z980 Intestinal bypass and anastomosis status: Secondary | ICD-10-CM | POA: Diagnosis not present

## 2020-04-23 DIAGNOSIS — Z85038 Personal history of other malignant neoplasm of large intestine: Secondary | ICD-10-CM | POA: Diagnosis not present

## 2020-04-23 DIAGNOSIS — D123 Benign neoplasm of transverse colon: Secondary | ICD-10-CM | POA: Diagnosis not present

## 2020-04-23 DIAGNOSIS — D124 Benign neoplasm of descending colon: Secondary | ICD-10-CM | POA: Diagnosis not present

## 2020-04-23 DIAGNOSIS — Z1211 Encounter for screening for malignant neoplasm of colon: Secondary | ICD-10-CM | POA: Diagnosis not present

## 2020-04-23 DIAGNOSIS — K5730 Diverticulosis of large intestine without perforation or abscess without bleeding: Secondary | ICD-10-CM | POA: Diagnosis not present

## 2020-04-23 DIAGNOSIS — D126 Benign neoplasm of colon, unspecified: Secondary | ICD-10-CM | POA: Diagnosis not present

## 2020-04-23 DIAGNOSIS — Z923 Personal history of irradiation: Secondary | ICD-10-CM | POA: Diagnosis not present

## 2020-04-24 ENCOUNTER — Telehealth: Payer: Self-pay

## 2020-05-27 ENCOUNTER — Other Ambulatory Visit: Payer: Medicare HMO

## 2020-05-27 ENCOUNTER — Telehealth: Payer: Self-pay

## 2020-05-27 ENCOUNTER — Other Ambulatory Visit: Payer: Self-pay

## 2020-05-27 DIAGNOSIS — R809 Proteinuria, unspecified: Secondary | ICD-10-CM

## 2020-05-27 DIAGNOSIS — R739 Hyperglycemia, unspecified: Secondary | ICD-10-CM

## 2020-05-27 DIAGNOSIS — E1129 Type 2 diabetes mellitus with other diabetic kidney complication: Secondary | ICD-10-CM | POA: Diagnosis not present

## 2020-05-27 LAB — BAYER DCA HB A1C WAIVED: HB A1C (BAYER DCA - WAIVED): 5.7 % (ref <7.0)

## 2020-05-27 NOTE — Telephone Encounter (Signed)
Labs placed patient aware.

## 2020-05-27 NOTE — Telephone Encounter (Signed)
Just do usual

## 2020-05-28 LAB — CBC WITH DIFFERENTIAL/PLATELET
Basophils Absolute: 0 10*3/uL (ref 0.0–0.2)
Basos: 1 %
EOS (ABSOLUTE): 0.1 10*3/uL (ref 0.0–0.4)
Eos: 1 %
Hematocrit: 43.1 % (ref 34.0–46.6)
Hemoglobin: 14.3 g/dL (ref 11.1–15.9)
Immature Grans (Abs): 0 10*3/uL (ref 0.0–0.1)
Immature Granulocytes: 1 %
Lymphocytes Absolute: 0.9 10*3/uL (ref 0.7–3.1)
Lymphs: 15 %
MCH: 29.5 pg (ref 26.6–33.0)
MCHC: 33.2 g/dL (ref 31.5–35.7)
MCV: 89 fL (ref 79–97)
Monocytes Absolute: 0.4 10*3/uL (ref 0.1–0.9)
Monocytes: 7 %
Neutrophils Absolute: 4.6 10*3/uL (ref 1.4–7.0)
Neutrophils: 75 %
Platelets: 192 10*3/uL (ref 150–450)
RBC: 4.85 x10E6/uL (ref 3.77–5.28)
RDW: 13 % (ref 11.7–15.4)
WBC: 6 10*3/uL (ref 3.4–10.8)

## 2020-05-28 LAB — CMP14+EGFR
ALT: 18 IU/L (ref 0–32)
AST: 17 IU/L (ref 0–40)
Albumin/Globulin Ratio: 2.2 (ref 1.2–2.2)
Albumin: 4.8 g/dL (ref 3.8–4.8)
Alkaline Phosphatase: 46 IU/L (ref 44–121)
BUN/Creatinine Ratio: 21 (ref 12–28)
BUN: 21 mg/dL (ref 8–27)
Bilirubin Total: 0.4 mg/dL (ref 0.0–1.2)
CO2: 22 mmol/L (ref 20–29)
Calcium: 9.8 mg/dL (ref 8.7–10.3)
Chloride: 105 mmol/L (ref 96–106)
Creatinine, Ser: 0.98 mg/dL (ref 0.57–1.00)
GFR calc Af Amer: 71 mL/min/{1.73_m2} (ref >59)
GFR calc non Af Amer: 61 mL/min/{1.73_m2} (ref >59)
Globulin, Total: 2.2 g/dL (ref 1.5–4.5)
Glucose: 103 mg/dL — ABNORMAL HIGH (ref 65–99)
Potassium: 4.3 mmol/L (ref 3.5–5.2)
Sodium: 142 mmol/L (ref 134–144)
Total Protein: 7 g/dL (ref 6.0–8.5)

## 2020-05-28 LAB — LIPID PANEL
Chol/HDL Ratio: 5.5 ratio — ABNORMAL HIGH (ref 0.0–4.4)
Cholesterol, Total: 197 mg/dL (ref 100–199)
HDL: 36 mg/dL — ABNORMAL LOW (ref >39)
LDL Chol Calc (NIH): 135 mg/dL — ABNORMAL HIGH (ref 0–99)
Triglycerides: 143 mg/dL (ref 0–149)
VLDL Cholesterol Cal: 26 mg/dL (ref 5–40)

## 2020-06-03 ENCOUNTER — Other Ambulatory Visit: Payer: Self-pay

## 2020-06-03 ENCOUNTER — Ambulatory Visit (INDEPENDENT_AMBULATORY_CARE_PROVIDER_SITE_OTHER): Payer: Medicare HMO | Admitting: Nurse Practitioner

## 2020-06-03 ENCOUNTER — Encounter: Payer: Self-pay | Admitting: Nurse Practitioner

## 2020-06-03 VITALS — BP 143/94 | HR 75 | Temp 97.1°F | Resp 20 | Ht 62.0 in | Wt 192.0 lb

## 2020-06-03 DIAGNOSIS — R809 Proteinuria, unspecified: Secondary | ICD-10-CM

## 2020-06-03 DIAGNOSIS — E039 Hypothyroidism, unspecified: Secondary | ICD-10-CM

## 2020-06-03 DIAGNOSIS — Z6835 Body mass index (BMI) 35.0-35.9, adult: Secondary | ICD-10-CM | POA: Diagnosis not present

## 2020-06-03 DIAGNOSIS — F410 Panic disorder [episodic paroxysmal anxiety] without agoraphobia: Secondary | ICD-10-CM | POA: Diagnosis not present

## 2020-06-03 DIAGNOSIS — E785 Hyperlipidemia, unspecified: Secondary | ICD-10-CM

## 2020-06-03 DIAGNOSIS — I10 Essential (primary) hypertension: Secondary | ICD-10-CM

## 2020-06-03 DIAGNOSIS — E1129 Type 2 diabetes mellitus with other diabetic kidney complication: Secondary | ICD-10-CM | POA: Diagnosis not present

## 2020-06-03 DIAGNOSIS — F419 Anxiety disorder, unspecified: Secondary | ICD-10-CM | POA: Diagnosis not present

## 2020-06-03 DIAGNOSIS — F3341 Major depressive disorder, recurrent, in partial remission: Secondary | ICD-10-CM | POA: Diagnosis not present

## 2020-06-03 DIAGNOSIS — F5101 Primary insomnia: Secondary | ICD-10-CM

## 2020-06-03 MED ORDER — LEVOTHYROXINE SODIUM 75 MCG PO TABS
ORAL_TABLET | ORAL | 1 refills | Status: DC
Start: 2020-06-03 — End: 2020-12-03

## 2020-06-03 MED ORDER — LOSARTAN POTASSIUM 100 MG PO TABS: 100.0000 mg | ORAL_TABLET | Freq: Every day | ORAL | 1 refills | Status: DC

## 2020-06-03 MED ORDER — SODIUM FLUORIDE 1.1 % DT GEL
1.0000 | Freq: Every day | DENTAL | 11 refills | Status: DC
Start: 2020-06-03 — End: 2020-06-27

## 2020-06-03 MED ORDER — ALPRAZOLAM 0.5 MG PO TABS: 0.5000 mg | ORAL_TABLET | Freq: Two times a day (BID) | ORAL | 1 refills | Status: DC

## 2020-06-03 MED ORDER — FENOFIBRATE 160 MG PO TABS
160.0000 mg | ORAL_TABLET | Freq: Every day | ORAL | 1 refills | Status: DC
Start: 2020-06-03 — End: 2020-12-03

## 2020-06-03 NOTE — Addendum Note (Signed)
Addended by: Bennie Pierini on: 06/03/2020 10:28 AM   Modules accepted: Orders

## 2020-06-03 NOTE — Patient Instructions (Signed)
Stress, Adult Stress is a normal reaction to life events. Stress is what you feel when life demands more than you are used to, or more than you think you can handle. Some stress can be useful, such as studying for a test or meeting a deadline at work. Stress that occurs too often or for too long can cause problems. It can affect your emotional health and interfere with relationships and normal daily activities. Too much stress can weaken your body's defense system (immune system) and increase your risk for physical illness. If you already have a medical problem, stress can make it worse. What are the causes? All sorts of life events can cause stress. An event that causes stress for one person may not be stressful for another person. Major life events, whether positive or negative, commonly cause stress. Examples include:  Losing a job or starting a new job.  Losing a loved one.  Moving to a new town or home.  Getting married or divorced.  Having a baby.  Getting injured or sick. Less obvious life events can also cause stress, especially if they occur day after day or in combination with each other. Examples include:  Working long hours.  Driving in traffic.  Caring for children.  Being in debt.  Being in a difficult relationship. What are the signs or symptoms? Stress can cause emotional symptoms, including:  Anxiety. This is feeling worried, afraid, on edge, overwhelmed, or out of control.  Anger, including irritation or impatience.  Depression. This is feeling sad, down, helpless, or guilty.  Trouble focusing, remembering, or making decisions. Stress can cause physical symptoms, including:  Aches and pains. These may affect your head, neck, back, stomach, or other areas of your body.  Tight muscles or a clenched jaw.  Low energy.  Trouble sleeping. Stress can cause unhealthy behaviors, including:  Eating to feel better (overeating) or skipping meals.  Working too  much or putting off tasks.  Smoking, drinking alcohol, or using drugs to feel better. How is this diagnosed? Stress is diagnosed through an assessment by your health care provider. He or she may diagnose this condition based on:  Your symptoms and any stressful life events.  Your medical history.  Tests to rule out other causes of your symptoms. Depending on your condition, your health care provider may refer you to a specialist for further evaluation. How is this treated?  Stress management techniques are the recommended treatment for stress. Medicine is not typically recommended for the treatment of stress. Techniques to reduce your reaction to stressful life events include:  Stress identification. Monitor yourself for symptoms of stress and identify what causes stress for you. These skills may help you to avoid or prepare for stressful events.  Time management. Set your priorities, keep a calendar of events, and learn to say no. Taking these actions can help you avoid making too many commitments. Techniques for coping with stress include:  Rethinking the problem. Try to think realistically about stressful events rather than ignoring them or overreacting. Try to find the positives in a stressful situation rather than focusing on the negatives.  Exercise. Physical exercise can release both physical and emotional tension. The key is to find a form of exercise that you enjoy and do it regularly.  Relaxation techniques. These relax the body and mind. The key is to find one or more that you enjoy and use the techniques regularly. Examples include: ? Meditation, deep breathing, or progressive relaxation techniques. ? Yoga or   tai chi. ? Biofeedback, mindfulness techniques, or journaling. ? Listening to music, being out in nature, or participating in other hobbies.  Practicing a healthy lifestyle. Eat a balanced diet, drink plenty of water, limit or avoid caffeine, and get plenty of  sleep.  Having a strong support network. Spend time with family, friends, or other people you enjoy being around. Express your feelings and talk things over with someone you trust. Counseling or talk therapy with a mental health professional may be helpful if you are having trouble managing stress on your own. Follow these instructions at home: Lifestyle   Avoid drugs.  Do not use any products that contain nicotine or tobacco, such as cigarettes, e-cigarettes, and chewing tobacco. If you need help quitting, ask your health care provider.  Limit alcohol intake to no more than 1 drink a day for nonpregnant women and 2 drinks a day for men. One drink equals 12 oz of beer, 5 oz of wine, or 1 oz of hard liquor  Do not use alcohol or drugs to relax.  Eat a balanced diet that includes fresh fruits and vegetables, whole grains, lean meats, fish, eggs, and beans, and low-fat dairy. Avoid processed foods and foods high in added fat, sugar, and salt.  Exercise at least 30 minutes on 5 or more days each week.  Get 7-8 hours of sleep each night. General instructions   Practice stress management techniques as discussed with your health care provider.  Drink enough fluid to keep your urine clear or pale yellow.  Take over-the-counter and prescription medicines only as told by your health care provider.  Keep all follow-up visits as told by your health care provider. This is important. Contact a health care provider if:  Your symptoms get worse.  You have new symptoms.  You feel overwhelmed by your problems and can no longer manage them on your own. Get help right away if:  You have thoughts of hurting yourself or others. If you ever feel like you may hurt yourself or others, or have thoughts about taking your own life, get help right away. You can go to your nearest emergency department or call:  Your local emergency services (911 in the [place]  A suicide crisis helpline, such as the  [place] at (316) 250-6172. This is open 24 hours a day. Summary  Stress is a normal reaction to life events. It can cause problems if it happens too often or for too long.  Practicing stress management techniques is the best way to treat stress.  Counseling or talk therapy with a mental health professional may be helpful if you are having trouble managing stress on your own. This information is not intended to replace advice given to you by your health care provider. Make sure you discuss any questions you have with your health care provider. Document Revised: 12/16/2018 Document Reviewed: 07/08/2016 Elsevier Patient Education  [place].

## 2020-06-03 NOTE — Progress Notes (Signed)
Subjective:    Patient ID: [name], female    DOB: 1956-01-08, 65 y.o.   MRN: [id number]   Chief Complaint: Medical Management of Chronic Issues    HPI:  1. Essential hypertension No c/o chest pain, sob ir headache. Dos noyt check blood pressure at home. BP Readings from Last 3 Encounters:  06/03/20 (!) 143/94  01/12/20 (!) 169/94  12/01/19 (!) 147/94     2. Hyperlipidemia with target LDL less than 100 Does not really watch diet and does little to no exercises. Refuses to take statin despite LDL. Lab Results  Component Value Date   CHOL 197 05/27/2020   HDL 36 (L) 05/27/2020   LDLCALC 135 (H) 05/27/2020   TRIG 143 05/27/2020   CHOLHDL 5.5 (H) 05/27/2020   The 10-year ASCVD risk score Mikey Bussing DC Jr., et al., 2013) is: 18.5%   3. Type 2 diabetes mellitus with microalbuminuria, without long-term current use of insulin ([place]) Does oyt watch diet very closely and does not check bloodsugars at home. Lab Results  Component Value Date   HGBA1C 5.7 05/27/2020     4. Acquired hypothyroidism No problems that she is aware of. Lab Results  Component Value Date   TSH 2.950 12/01/2019     5. Anxiety Stays anxious. Is just her personality GAD 7 : Generalized Anxiety Score 06/03/2020 08/29/2019 02/17/2019 10/07/2017  Nervous, Anxious, on Edge 1 3 3 3   Control/stop worrying 2 2 2 3   Worry too much - different things 2 2 2 2   Trouble relaxing 0 2 2 2   Restless 0 0 0 3  Easily annoyed or irritable 0 0 0 1  Afraid - awful might happen 3 3 0 2  Total GAD 7 Score 8 12 9 16   Anxiety Difficulty Somewhat difficult Not difficult at all Somewhat difficult Somewhat difficult    6. Recurrent major depressive disorder, in partial remission ([place]) Is currently on on antidepressant. Says she feelsok. Depression screen [facility] 2/9 06/03/2020 12/01/2019 12/01/2019  Decreased Interest 0 0 0  Down, Depressed, Hopeless 0 0 0  PHQ - 2 Score 0 0 0  Altered sleeping 0 0 -  Tired, decreased energy 0 0  -  Change in appetite 0 0 -  Feeling bad or failure about yourself  0 0 -  Trouble concentrating 0 0 -  Moving slowly or fidgety/restless 0 0 -  Suicidal thoughts 0 0 -  PHQ-9 Score 0 0 -  Difficult doing work/chores Not difficult at all Not difficult at all -  Some recent data might be hidden     7. Panic attacks Has not had any recent attacks  8. Primary insomnia Sleeps good off and on. Does not sleep good because her husband wakes her up frequently.  9. BMI 37.0-37.9,adult Weight  Is down 11lbs since last visit Wt Readings from Last 3 Encounters:  06/03/20 192 lb (87.1 kg)  01/12/20 204 lb 12.8 oz (92.9 kg)  12/01/19 201 lb (91.2 kg)   BMI Readings from Last 3 Encounters:  06/03/20 35.12 kg/m  01/12/20 37.22 kg/m  12/01/19 36.76 kg/m       Outpatient Encounter Medications as of 06/03/2020  Medication Sig  . ALPRAZolam (XANAX) 0.5 MG tablet Take 1 tablet (0.5 mg total) by mouth 2 (two) times daily.  Marland Kitchen Apple Cid Vn-Grn Tea-Bit Or-Cr (APPLE CIDER VINEGAR PLUS) TABS Take 1 tablet by mouth daily.   . Coenzyme Q10 100 MG capsule Take 100 mg by mouth daily.   Marland Kitchen  fenofibrate 160 MG tablet Take 1 tablet (160 mg total) by mouth daily.  . Flaxseed, Linseed, (FLAXSEED OIL) 1000 MG CAPS Take 1,000 mg by mouth daily.   Marland Kitchen HAWTHORNE BERRY PO Take 565 mg by mouth daily.   . INOSITOL PO Take 1 capsule by mouth daily.  Marland Kitchen levothyroxine (SYNTHROID) 75 MCG tablet TAKE 1 TABLET EVERY DAY BEFORE BREAKFAST  . losartan (COZAAR) 100 MG tablet Take 1 tablet (100 mg total) by mouth daily.  Marland Kitchen Lysine 500 MG CAPS Take 500 mg by mouth 2 (two) times a week.   . Milk Thistle 250 MG CAPS Take 250 mg by mouth daily.   . mupirocin ointment (BACTROBAN) 2 % Place 1 application into the nose 2 (two) times daily.  Marland Kitchen OVER THE COUNTER MEDICATION Take 1 capsule by mouth 2 (two) times daily. Liva Plex Supplement  . OVER THE COUNTER MEDICATION Take 3 tablets by mouth 3 (three) times daily. Cata Plex Supplement   . silver sulfADIAZINE (SILVADENE) 1 % cream Apply 3-4 times daily  . sodium fluoride (DENTAGEL) 1.1 % GEL dental gel Place 1 application onto teeth at bedtime.  . SUPER B COMPLEX/C PO Take 1 capsule by mouth daily.   No facility-administered encounter medications on file as of 06/03/2020.    Past Surgical History:  Procedure Laterality Date  . COLON RESECTION    . KNEE SURGERY    . oral surgery tooth extraction    . TONSILLECTOMY    . VAGINAL HYSTERECTOMY    . 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 Cancer Mother 41       breast   . Heart attack Father   . Heart failure Father   . Cancer Maternal Aunt 40       pancreas  . 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  Eyes: Negative for pain.  Respiratory: Negative for shortness of breath.   Cardiovascular: Negative for chest pain, palpitations and leg swelling.  Gastrointestinal: Negative for abdominal pain.  Endocrine: Negative for polydipsia.  Skin: Negative for rash.  Neurological: Negative for dizziness, weakness and headaches.  Hematological: Does not bruise/bleed easily.  All other systems reviewed and are negative.      Objective:   Physical Exam Vitals and nursing note reviewed.  Constitutional:      General: She is not in acute distress.    Appearance: Normal appearance. She is well-developed and well-nourished.  HENT:     Head: Normocephalic.     Nose: Nose normal.     Mouth/Throat:     Mouth: Oropharynx is clear and moist.  Eyes:     Extraocular Movements: EOM normal.     Pupils: Pupils are  equal, round, and reactive to light.  Neck:     Vascular: No carotid bruit or JVD.  Cardiovascular:     Rate and Rhythm: Normal rate and regular rhythm.     Pulses: Intact distal pulses.     Heart sounds: Normal heart sounds.  Pulmonary:     Effort: Pulmonary effort is normal. No respiratory distress.  Breath sounds: Normal breath sounds. No wheezing or rales.  Chest:     Chest wall: No tenderness.  Abdominal:     General: Bowel sounds are normal. There is no distension or abdominal bruit. Aorta is normal.     Palpations: Abdomen is soft. There is no hepatomegaly, splenomegaly, mass or pulsatile mass.     Tenderness: There is no abdominal tenderness.  Musculoskeletal:        General: No edema. Normal range of motion.     Cervical back: Normal range of motion and neck supple.  Lymphadenopathy:     Cervical: No cervical adenopathy.  Skin:    General: Skin is warm and dry.  Neurological:     Mental Status: She is alert and oriented to person, place, and time.     Deep Tendon Reflexes: Reflexes are normal and symmetric.  Psychiatric:        Mood and Affect: Mood and affect normal.        Behavior: Behavior normal.        Thought Content: Thought content normal.        Judgment: Judgment normal.     BP (!) 143/94   Pulse 75   Temp (!) 97.1 F (36.2 C) (Temporal)   Resp 20   Ht 5\' 2"  (1.575 m)   Wt 192 lb (87.1 kg)   SpO2 93%   BMI 35.12 kg/m        Assessment & Plan:  [name] comes in today with chief complaint of Medical Management of Chronic Issues   Diagnosis and orders addressed:  1. Essential hypertension Low sodium diet - losartan (COZAAR) 100 MG tablet; Take 1 tablet (100 mg total) by mouth daily.  Dispense: 90 tablet; Refill: 1  2. Hyperlipidemia with target LDL less than 100 Low fat diet - fenofibrate 160 MG tablet; Take 1 tablet (160 mg total) by mouth daily.  Dispense: 90 tablet; Refill: 1  3. Type 2 diabetes mellitus with microalbuminuria,  without long-term current use of insulin (H[facility]) Continue  To watch carbsin diet  4. Acquired hypothyroidism Will do labs at next visit - levothyroxine (SYNTHROID) 75 MCG tablet; TAKE 1 TABLET EVERY DAY BEFORE BREAKFAST  Dispense: 90 tablet; Refill: 1  5. Anxiety Stress managemenet - ALPRAZolam (XANAX) 0.5 MG tablet; Take 1 tablet (0.5 mg total) by mouth 2 (two) times daily.  Dispense: 180 tablet; Refill: 1  6. Recurrent major depressive disorder, in partial remission ([place])  7. Panic attacks  8. Primary insomnia Bedtime routine  9. BMI 35.0-35.9,adult Discussed diet and exercise for person with BMI >25 Will recheck weight in 3-6 months   Labs pending Health Maintenance reviewed Diet and exercise encouraged  Follow up plan: 6 months   [name] Done, FNP

## 2020-06-27 MED ORDER — SODIUM FLUORIDE 1.1 % DT GEL: 1.0000 "application " | Freq: Every day | DENTAL | 11 refills | Status: DC

## 2020-11-16 DIAGNOSIS — E119 Type 2 diabetes mellitus without complications: Secondary | ICD-10-CM | POA: Diagnosis not present

## 2020-11-16 DIAGNOSIS — H40033 Anatomical narrow angle, bilateral: Secondary | ICD-10-CM | POA: Diagnosis not present

## 2020-12-03 ENCOUNTER — Encounter: Payer: Self-pay | Admitting: Nurse Practitioner

## 2020-12-03 ENCOUNTER — Other Ambulatory Visit: Payer: Self-pay

## 2020-12-03 ENCOUNTER — Ambulatory Visit (INDEPENDENT_AMBULATORY_CARE_PROVIDER_SITE_OTHER): Payer: Medicare HMO | Admitting: Nurse Practitioner

## 2020-12-03 VITALS — BP 122/78 | HR 70 | Temp 97.5°F | Resp 20 | Ht 62.0 in | Wt 189.0 lb

## 2020-12-03 DIAGNOSIS — R809 Proteinuria, unspecified: Secondary | ICD-10-CM

## 2020-12-03 DIAGNOSIS — E039 Hypothyroidism, unspecified: Secondary | ICD-10-CM

## 2020-12-03 DIAGNOSIS — F410 Panic disorder [episodic paroxysmal anxiety] without agoraphobia: Secondary | ICD-10-CM | POA: Diagnosis not present

## 2020-12-03 DIAGNOSIS — F419 Anxiety disorder, unspecified: Secondary | ICD-10-CM | POA: Diagnosis not present

## 2020-12-03 DIAGNOSIS — F5101 Primary insomnia: Secondary | ICD-10-CM | POA: Diagnosis not present

## 2020-12-03 DIAGNOSIS — F3341 Major depressive disorder, recurrent, in partial remission: Secondary | ICD-10-CM | POA: Diagnosis not present

## 2020-12-03 DIAGNOSIS — I10 Essential (primary) hypertension: Secondary | ICD-10-CM | POA: Diagnosis not present

## 2020-12-03 DIAGNOSIS — Z6833 Body mass index (BMI) 33.0-33.9, adult: Secondary | ICD-10-CM | POA: Diagnosis not present

## 2020-12-03 DIAGNOSIS — E1129 Type 2 diabetes mellitus with other diabetic kidney complication: Secondary | ICD-10-CM

## 2020-12-03 DIAGNOSIS — E785 Hyperlipidemia, unspecified: Secondary | ICD-10-CM

## 2020-12-03 LAB — BAYER DCA HB A1C WAIVED: HB A1C (BAYER DCA - WAIVED): 5.6 % (ref <7.0)

## 2020-12-03 MED ORDER — ALPRAZOLAM 0.5 MG PO TABS
0.5000 mg | ORAL_TABLET | Freq: Two times a day (BID) | ORAL | 1 refills | Status: DC
Start: 2020-12-03 — End: 2021-06-05

## 2020-12-03 MED ORDER — LOSARTAN POTASSIUM 100 MG PO TABS: 100.0000 mg | ORAL_TABLET | Freq: Every day | ORAL | 1 refills | Status: DC

## 2020-12-03 MED ORDER — FENOFIBRATE 160 MG PO TABS: 160.0000 mg | ORAL_TABLET | Freq: Every day | ORAL | 1 refills | Status: DC

## 2020-12-03 MED ORDER — LEVOTHYROXINE SODIUM 75 MCG PO TABS: ORAL_TABLET | ORAL | 1 refills | Status: DC

## 2020-12-03 NOTE — Progress Notes (Signed)
Subjective:    Patient ID: [name], female    DOB: 01/10/1956, 65 y.o.   MRN: [id number]  Chief Complaint: Medical Management of Chronic Issues    HPI:  1. Essential hypertension No c/o chest pain, sob or headache. Does not check blood pressure at home. BP Readings from Last 3 Encounters:  12/03/20 122/78  06/03/20 (!) 143/94  01/12/20 (!) 169/94     2. Hyperlipidemia with target LDL less than 100 Doe snot watch diet and does no dedicated exercises. Refuses to take statin Lab Results  Component Value Date   CHOL 197 05/27/2020   HDL 36 (L) 05/27/2020   LDLCALC 135 (H) 05/27/2020   TRIG 143 05/27/2020   CHOLHDL 5.5 (H) 05/27/2020     3. Type 2 diabetes mellitus with microalbuminuria, without long-term current use of insulin ([place]) Does not really watch diet. Sh edoe snot check her blood sugars at home. Lab Results  Component Value Date   HGBA1C 5.7 05/27/2020     4. Acquired hypothyroidism No problems that she is aware of  5. 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 Depression screen [facility] 2/9 12/03/2020 06/03/2020 12/01/2019  Decreased Interest 0 0 0  Down, Depressed, Hopeless 1 0 0  PHQ - 2 Score 1 0 0  Altered sleeping 3 0 0  Tired, decreased energy 1 0 0  Change in appetite 1 0 0  Feeling bad or failure about yourself  0 0 0  Trouble concentrating 0 0 0  Moving slowly or fidgety/restless 0 0 0  Suicidal thoughts 0 0 0  PHQ-9 Score 6 0 0  Difficult doing work/chores Not difficult at all Not difficult at all Not difficult at all  Some recent data might be hidden     6. Anxiety Stays anxious. Is on xanax BID. GAD 7 : Generalized Anxiety Score 12/03/2020 06/03/2020 08/29/2019 02/17/2019  Nervous, Anxious, on Edge 3 1 3 3   Control/stop worrying 3 2 2 2   Worry too much - different things 3 2 2 2   Trouble relaxing 0 0 2 2  Restless 0 0 0 0  Easily annoyed or irritable 0 0 0 0   Afraid - awful might happen 3 3 3  0  Total GAD 7 Score 12 8 12 9   Anxiety Difficulty Not difficult at all Somewhat difficult Not difficult at all Somewhat difficult       7. Panic attacks She has not had nay recently. The xanax seems to keep her under control.  8. Primary insomnia Usually takes xanax at bedtime which helps her sleep  9. BMI 33.0-33.9,adult No recent weight changes Wt Readings from Last 3 Encounters:  12/03/20 189 lb (85.7 kg)  06/03/20 192 lb (87.1 kg)  01/12/20 204 lb 12.8 oz (92.9 kg)   BMI Readings from Last 3 Encounters:  12/03/20 34.57 kg/m  06/03/20 35.12 kg/m  01/12/20 37.22 kg/m       Outpatient Encounter Medications as of 12/03/2020  Medication Sig   ALPRAZolam (XANAX) 0.5 MG tablet Take 1 tablet (0.5 mg total) by mouth 2 (two) times daily.   Apple Cid Vn-Grn Tea-Bit Or-Cr (APPLE CIDER VINEGAR PLUS) TABS Take 1 tablet by mouth daily.    Coenzyme Q10 100 MG capsule Take 100 mg by mouth daily.    fenofibrate 160 MG tablet Take 1 tablet (160 mg total) by mouth daily.   Flaxseed, Linseed, (FLAXSEED OIL) 1000  MG CAPS Take 1,000 mg by mouth daily.    HAWTHORNE BERRY PO Take 565 mg by mouth daily.    INOSITOL PO Take 1 capsule by mouth daily.   levothyroxine (SYNTHROID) 75 MCG tablet TAKE 1 TABLET EVERY DAY BEFORE BREAKFAST   losartan (COZAAR) 100 MG tablet Take 1 tablet (100 mg total) by mouth daily.   Lysine 500 MG CAPS Take 500 mg by mouth 2 (two) times a week.    Milk Thistle 250 MG CAPS Take 250 mg by mouth daily.    OVER THE COUNTER MEDICATION Take 1 capsule by mouth 2 (two) times daily. Liva Plex Supplement   OVER THE COUNTER MEDICATION Take 3 tablets by mouth 3 (three) times daily. Cata Plex Supplement   sodium fluoride (DENTAGEL) 1.1 % GEL dental gel Place 1 application onto teeth at bedtime.   SUPER B COMPLEX/C PO Take 1 capsule by mouth daily.   No facility-administered encounter medications on file as of 12/03/2020.    Past Surgical  History:  Procedure Laterality Date   COLON RESECTION     KNEE SURGERY     oral surgery tooth extraction     TONSILLECTOMY     VAGINAL HYSTERECTOMY     VULVECTOMY PARTIAL Bilateral 03/30/2018   Procedure: LASER PARTIAL VULVECTOMY FOR VULVUL INTRAEPITHELIA NEOPLASIS III;  Surgeon: Florian Buff, MD;  Location: [facility];  Service: Gynecology;  Laterality: Bilateral;    Family History  Problem Relation Age of Onset   Cancer Mother 47       breast    Heart attack Father    Heart failure Father    Cancer Maternal Aunt 42       pancreas   Cancer Maternal Grandfather        lung    New complaints: None today  Social history: 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  Controlled substance contract: 12/03/20     Review of Systems  Constitutional:  Negative for diaphoresis.  Eyes:  Negative for pain.  Respiratory:  Negative for shortness of breath.   Cardiovascular:  Negative for chest pain, palpitations and leg swelling.  Gastrointestinal:  Negative for abdominal pain.  Endocrine: Negative for polydipsia.  Skin:  Negative for rash.  Neurological:  Negative for dizziness, weakness and headaches.  Hematological:  Does not bruise/bleed easily.  All other systems reviewed and are negative.     Objective:   Physical Exam Vitals and nursing note reviewed.  Constitutional:      General: She is not in acute distress.    Appearance: Normal appearance. She is well-developed.  HENT:     Head: Normocephalic.     Right Ear: Tympanic membrane normal.     Left Ear: Tympanic membrane normal.     Nose: Nose normal.     Mouth/Throat:     Mouth: Mucous membranes are moist.  Eyes:     Pupils: Pupils are equal, round, and reactive to light.  Neck:     Vascular: No carotid bruit or JVD.  Cardiovascular:     Rate and Rhythm: Normal rate and regular rhythm.     Heart sounds: Normal heart sounds.  Pulmonary:     Effort: Pulmonary effort is normal. No respiratory distress.      Breath sounds: Normal breath sounds. No wheezing or rales.  Chest:     Chest wall: No tenderness.  Abdominal:     General: Bowel sounds are normal. There is no distension or abdominal bruit.  Palpations: Abdomen is soft. There is no hepatomegaly, splenomegaly, mass or pulsatile mass.     Tenderness: There is no abdominal tenderness.  Musculoskeletal:        General: Normal range of motion.     Cervical back: Normal range of motion and neck supple.  Lymphadenopathy:     Cervical: No cervical adenopathy.  Skin:    General: Skin is warm and dry.  Neurological:     Mental Status: She is alert and oriented to person, place, and time.     Deep Tendon Reflexes: Reflexes are normal and symmetric.  Psychiatric:        Behavior: Behavior normal.        Thought Content: Thought content normal.        Judgment: Judgment normal.    BP 122/78   Pulse 70   Temp (!) 97.5 F (36.4 C) (Temporal)   Resp 20   Ht 5' 2"  (1.575 m)   Wt 189 lb (85.7 kg)   SpO2 92%   BMI 34.57 kg/m        Assessment & Plan:   MOZETTA MURFIN comes in today with chief complaint of Medical Management of Chronic Issues   Diagnosis and orders addressed:  1. Essential hypertension Low sodium diet - CBC with Differential/Platelet - CMP14+EGFR - losartan (COZAAR) 100 MG tablet; Take 1 tablet (100 mg total) by mouth daily.  Dispense: 90 tablet; Refill: 1  2. Hyperlipidemia with target LDL less than 100 Low fat diet - Lipid panel - fenofibrate 160 MG tablet; Take 1 tablet (160 mg total) by mouth daily.  Dispense: 90 tablet; Refill: 1  3. Type 2 diabetes mellitus with microalbuminuria, without long-term current use of insulin (HCC) Continue to watch carbs in diet - Bayer DCA Hb A1c Waived  4. Acquired hypothyroidism Labs pending - Thyroid Panel With TSH - levothyroxine (SYNTHROID) 75 MCG tablet; TAKE 1 TABLET EVERY DAY BEFORE BREAKFAST  Dispense: 90 tablet; Refill: 1  5. Recurrent major depressive  disorder, in partial remission ([place]) Stress management  6. Anxiety - ALPRAZolam (XANAX) 0.5 MG tablet; Take 1 tablet (0.5 mg total) by mouth 2 (two) times daily.  Dispense: 180 tablet; Refill: 1  7. Panic attacks Deep breathing exercises  8. Primary insomnia Bedtime routine  9. BMI 33.0-33.9,adult Discussed diet and exercise for person with BMI >25 Will recheck weight in 3-6 months    Labs pending Health Maintenance reviewed Diet and exercise encouraged  Follow up plan: 3 months   [name] Done, FNP

## 2020-12-03 NOTE — Patient Instructions (Signed)
Textbook of family medicine (9th ed., pp. 1062-1073). [place], [place]: [employer].">  Stress, Adult Stress is a normal reaction to life events. Stress is what you feel when life demands more than you are used to, or more than you think you can handle. Some stress can be useful, such as studying for a test or meeting a deadline at work. Stress that occurs too often or for too long can cause problems. It can affect your emotional health and interfere with relationships and normal daily activities. Too much stress can weaken your body's defense system (immune system) and increase your risk for physical illness. If you already have a medicalproblem, stress can make it worse. What are the causes? All sorts of life events can cause stress. An event that causes stress for one person may not be stressful for another person. Major life events, whether positive or negative, commonly cause stress. Examples include: Losing a job or starting a new job. Losing a loved one. Moving to a new town or home. Getting married or divorced. Having a baby. Getting injured or sick. Less obvious life events can also cause stress, especially if they occur day after day or in combination with each other. Examples include: Working long hours. Driving in traffic. Caring for children. Being in debt. Being in a difficult relationship. What are the signs or symptoms? Stress can cause emotional symptoms, including: Anxiety. This is feeling worried, afraid, on edge, overwhelmed, or out of control. Anger, including irritation or impatience. Depression. This is feeling sad, down, helpless, or guilty. Trouble focusing, remembering, or making decisions. Stress can cause physical symptoms, including: Aches and pains. These may affect your head, neck, back, stomach, or other areas of your body. Tight muscles or a clenched jaw. Low energy. Trouble sleeping. Stress can cause unhealthy behaviors, including: Eating to feel better  (overeating) or skipping meals. Working too much or putting off tasks. Smoking, drinking alcohol, or using drugs to feel better. How is this diagnosed? Stress is diagnosed through an assessment by your health care provider. He or she may diagnose this condition based on: Your symptoms and any stressful life events. Your medical history. Tests to rule out other causes of your symptoms. Depending on your condition, your health care provider may refer you to aspecialist for further evaluation. How is this treated?  Stress management techniques are the recommended treatment for stress. Medicineis not typically recommended for the treatment of stress. Techniques to reduce your reaction to stressful life events include: Stress identification. Monitor yourself for symptoms of stress and identify what causes stress for you. These skills may help you to avoid or prepare for stressful events. Time management. Set your priorities, keep a calendar of events, and learn to say no. Taking these actions can help you avoid making too many commitments. Techniques for coping with stress include: Rethinking the problem. Try to think realistically about stressful events rather than ignoring them or overreacting. Try to find the positives in a stressful situation rather than focusing on the negatives. Exercise. Physical exercise can release both physical and emotional tension. The key is to find a form of exercise that you enjoy and do it regularly. Relaxation techniques. These relax the body and mind. The key is to find one or more that you enjoy and use the techniques regularly. Examples include: Meditation, deep breathing, or progressive relaxation techniques. Yoga or tai chi. Biofeedback, mindfulness techniques, or journaling. Listening to music, being out in nature, or participating in other hobbies. Practicing a healthy lifestyle.   Eat a balanced diet, drink plenty of water, limit or avoid caffeine, and get  plenty of sleep. Having a strong support network. Spend time with family, friends, or other people you enjoy being around. Express your feelings and talk things over with someone you trust. Counseling or talk therapy with a mental health professional may be helpful if you are havingtrouble managing stress on your own. Follow these instructions at home: Lifestyle  Avoid drugs. Do not use any products that contain nicotine or tobacco, such as cigarettes, e-cigarettes, and chewing tobacco. If you need help quitting, ask your health care provider. Limit alcohol intake to no more than 1 drink a day for nonpregnant women and 2 drinks a day for men. One drink equals 12 oz of beer, 5 oz of wine, or 1 oz of hard liquor Do not use alcohol or drugs to relax. Eat a balanced diet that includes fresh fruits and vegetables, whole grains, lean meats, fish, eggs, and beans, and low-fat dairy. Avoid processed foods and foods high in added fat, sugar, and salt. Exercise at least 30 minutes on 5 or more days each week. Get 7-8 hours of sleep each night.  General instructions  Practice stress management techniques as discussed with your health care provider. Drink enough fluid to keep your urine clear or pale yellow. Take over-the-counter and prescription medicines only as told by your health care provider. Keep all follow-up visits as told by your health care provider. This is important.  Contact a health care provider if: Your symptoms get worse. You have new symptoms. You feel overwhelmed by your problems and can no longer manage them on your own. Get help right away if: You have thoughts of hurting yourself or others. If you ever feel like you may hurt yourself or others, or have thoughts about taking your own life, get help right away. You can go to your nearest emergency department or call: Your local emergency services (911 in the [place] A suicide crisis helpline, such as the [facility] at 4253100524. This is open 24 hours a day. Summary Stress is a normal reaction to life events. It can cause problems if it happens too often or for too long. Practicing stress management techniques is the best way to treat stress. Counseling or talk therapy with a mental health professional may be helpful if you are having trouble managing stress on your own. This information is not intended to replace advice given to you by your health care provider. Make sure you discuss any questions you have with your healthcare provider. Document Revised: 02/02/2020 Document Reviewed: 02/02/2020 Elsevier Patient Education  2022 [employer].

## 2020-12-04 LAB — CBC WITH DIFFERENTIAL/PLATELET
Basophils Absolute: 0 10*3/uL (ref 0.0–0.2)
Basos: 0 %
EOS (ABSOLUTE): 0.1 10*3/uL (ref 0.0–0.4)
Eos: 2 %
Hematocrit: 43.3 % (ref 34.0–46.6)
Hemoglobin: 14.3 g/dL (ref 11.1–15.9)
Immature Grans (Abs): 0 10*3/uL (ref 0.0–0.1)
Immature Granulocytes: 0 %
Lymphocytes Absolute: 1.1 10*3/uL (ref 0.7–3.1)
Lymphs: 19 %
MCH: 29.8 pg (ref 26.6–33.0)
MCHC: 33 g/dL (ref 31.5–35.7)
MCV: 90 fL (ref 79–97)
Monocytes Absolute: 0.3 10*3/uL (ref 0.1–0.9)
Monocytes: 5 %
Neutrophils Absolute: 4.2 10*3/uL (ref 1.4–7.0)
Neutrophils: 74 %
Platelets: 202 10*3/uL (ref 150–450)
RBC: 4.8 x10E6/uL (ref 3.77–5.28)
RDW: 13 % (ref 11.7–15.4)
WBC: 5.7 10*3/uL (ref 3.4–10.8)

## 2020-12-04 LAB — LIPID PANEL
Chol/HDL Ratio: 5.6 ratio — ABNORMAL HIGH (ref 0.0–4.4)
Cholesterol, Total: 203 mg/dL — ABNORMAL HIGH (ref 100–199)
HDL: 36 mg/dL — ABNORMAL LOW (ref >39)
LDL Chol Calc (NIH): 134 mg/dL — ABNORMAL HIGH (ref 0–99)
Triglycerides: 186 mg/dL — ABNORMAL HIGH (ref 0–149)
VLDL Cholesterol Cal: 33 mg/dL (ref 5–40)

## 2020-12-04 LAB — CMP14+EGFR
ALT: 21 IU/L (ref 0–32)
AST: 21 IU/L (ref 0–40)
Albumin/Globulin Ratio: 2.7 — ABNORMAL HIGH (ref 1.2–2.2)
Albumin: 4.9 g/dL — ABNORMAL HIGH (ref 3.8–4.8)
Alkaline Phosphatase: 46 IU/L (ref 44–121)
BUN/Creatinine Ratio: 15 (ref 12–28)
BUN: 18 mg/dL (ref 8–27)
Bilirubin Total: 0.4 mg/dL (ref 0.0–1.2)
CO2: 20 mmol/L (ref 20–29)
Calcium: 9.8 mg/dL (ref 8.7–10.3)
Chloride: 104 mmol/L (ref 96–106)
Creatinine, Ser: 1.21 mg/dL — ABNORMAL HIGH (ref 0.57–1.00)
Globulin, Total: 1.8 g/dL (ref 1.5–4.5)
Glucose: 93 mg/dL (ref 65–99)
Potassium: 4.3 mmol/L (ref 3.5–5.2)
Sodium: 141 mmol/L (ref 134–144)
Total Protein: 6.7 g/dL (ref 6.0–8.5)
eGFR: 50 mL/min/{1.73_m2} — ABNORMAL LOW (ref >59)

## 2020-12-04 LAB — THYROID PANEL WITH TSH
Free Thyroxine Index: 1.9 (ref 1.2–4.9)
T3 Uptake Ratio: 29 % (ref 24–39)
T4, Total: 6.7 ug/dL (ref 4.5–12.0)
TSH: 3.43 u[IU]/mL (ref 0.450–4.500)

## 2021-01-21 ENCOUNTER — Other Ambulatory Visit: Payer: Self-pay

## 2021-01-21 ENCOUNTER — Encounter: Payer: Self-pay | Admitting: Obstetrics & Gynecology

## 2021-01-21 ENCOUNTER — Ambulatory Visit: Payer: Medicare HMO | Admitting: Obstetrics & Gynecology

## 2021-01-21 VITALS — BP 134/77 | HR 67 | Ht 62.5 in | Wt 196.0 lb

## 2021-01-21 DIAGNOSIS — D071 Carcinoma in situ of vulva: Secondary | ICD-10-CM

## 2021-01-21 NOTE — Progress Notes (Unsigned)
Chief Complaint  Patient presents with   Follow-up      65 y.o. G1P0 No LMP recorded. Patient has had a hysterectomy. The current method of family planning is {contraception:315051}.  Outpatient Encounter Medications as of 01/21/2021  Medication Sig   ALPRAZolam (XANAX) 0.5 MG tablet Take 1 tablet (0.5 mg total) by mouth 2 (two) times daily.   Apple Cid Vn-Grn Tea-Bit Or-Cr (APPLE CIDER VINEGAR PLUS) TABS Take 1 tablet by mouth daily.    fenofibrate 160 MG tablet Take 1 tablet (160 mg total) by mouth daily.   HAWTHORNE BERRY PO Take 565 mg by mouth daily.    INOSITOL PO Take 1 capsule by mouth daily.   levothyroxine (SYNTHROID) 75 MCG tablet TAKE 1 TABLET EVERY DAY BEFORE BREAKFAST   losartan (COZAAR) 100 MG tablet Take 1 tablet (100 mg total) by mouth daily.   Lysine 500 MG CAPS Take 500 mg by mouth 2 (two) times a week.    Milk Thistle 250 MG CAPS Take 250 mg by mouth daily.    Multiple Vitamins-Minerals (CENTRUM SILVER PO) Take by mouth.   OVER THE COUNTER MEDICATION Take 1 capsule by mouth 2 (two) times daily. Liva Plex Supplement   OVER THE COUNTER MEDICATION Take 3 tablets by mouth 3 (three) times daily. Cata Plex Supplement   sodium fluoride (DENTAGEL) 1.1 % GEL dental gel Place 1 application onto teeth at bedtime.   Flaxseed, Linseed, (FLAXSEED OIL) 1000 MG CAPS Take 1,000 mg by mouth daily.  (Patient not taking: Reported on 01/21/2021)   [DISCONTINUED] Prenatal Vit-Fe Fumarate-FA (M-VIT PO) Take by mouth.   [DISCONTINUED] SUPER B COMPLEX/C PO Take 1 capsule by mouth daily.   No facility-administered encounter medications on file as of 01/21/2021.    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,consistent w/high grade mucoepidermoid ca   Chronic hoarseness    years   Dysphagia    Hypertension    Hypertriglyceridemia     Past Surgical  History:  Procedure Laterality Date   COLON RESECTION     COLONOSCOPY     04/2020   KNEE SURGERY     oral surgery tooth extraction     TONSILLECTOMY     TOTAL ABDOMINAL HYSTERECTOMY     VULVECTOMY PARTIAL Bilateral 03/30/2018   Procedure: LASER PARTIAL VULVECTOMY FOR VULVUL INTRAEPITHELIA NEOPLASIS III;  Surgeon: Florian Buff, MD;  Location: [facility];  Service: Gynecology;  Laterality: Bilateral;    OB History     Gravida  1   Para      Term      Preterm      AB      Living  1      SAB      IAB      Ectopic      Multiple      Live Births  1           Allergies  Allergen Reactions   Azithromycin Diarrhea   Sulfa Antibiotics Other (See Comments)    Unable to recall    Social History   Socioeconomic History   Marital status: Married    Spouse name: Not on file   Number of children: 1   Years of education: Not on file   Highest education level: Not on file  Occupational History    Employer:  [employer]  Tobacco Use   Smoking status: Some Days    Packs/day: 0.25    Years: 40.00    Pack years: 10.00    Types: Cigarettes   Smokeless tobacco: Never  Vaping Use   Vaping Use: Never used  Substance and Sexual Activity   Alcohol use: Not Currently   Drug use: No   Sexual activity: Not Currently    Birth control/protection: Surgical    Comment: hyst  Other Topics Concern   Not on file  Social History Narrative   Not on file   Social Determinants of Health   Financial Resource Strain: Not on file  Food Insecurity: Not on file  Transportation Needs: Not on file  Physical Activity: Not on file  Stress: Not on file  Social Connections: Not on file    Family History  Problem Relation Age of Onset   Cancer Maternal Grandfather        lung   Heart attack Father    Heart failure Father    Cancer Mother 44       breast    Cancer Maternal Aunt 40       pancreas   Mental illness Daughter     Medications:       Current Outpatient  Medications:    ALPRAZolam (XANAX) 0.5 MG tablet, Take 1 tablet (0.5 mg total) by mouth 2 (two) times daily., Disp: 180 tablet, Rfl: 1   Apple Cid Vn-Grn Tea-Bit Or-Cr (APPLE CIDER VINEGAR PLUS) TABS, Take 1 tablet by mouth daily. , Disp: , Rfl:    fenofibrate 160 MG tablet, Take 1 tablet (160 mg total) by mouth daily., Disp: 90 tablet, Rfl: 1   HAWTHORNE BERRY PO, Take 565 mg by mouth daily. , Disp: , Rfl:    INOSITOL PO, Take 1 capsule by mouth daily., Disp: , Rfl:    levothyroxine (SYNTHROID) 75 MCG tablet, TAKE 1 TABLET EVERY DAY BEFORE BREAKFAST, Disp: 90 tablet, Rfl: 1   losartan (COZAAR) 100 MG tablet, Take 1 tablet (100 mg total) by mouth daily., Disp: 90 tablet, Rfl: 1   Lysine 500 MG CAPS, Take 500 mg by mouth 2 (two) times a week. , Disp: , Rfl:    Milk Thistle 250 MG CAPS, Take 250 mg by mouth daily. , Disp: , Rfl:    Multiple Vitamins-Minerals (CENTRUM SILVER PO), Take by mouth., Disp: , Rfl:    OVER THE COUNTER MEDICATION, Take 1 capsule by mouth 2 (two) times daily. Liva Plex Supplement, Disp: , Rfl:    OVER THE COUNTER MEDICATION, Take 3 tablets by mouth 3 (three) times daily. Cata Plex Supplement, Disp: , Rfl:    sodium fluoride (DENTAGEL) 1.1 % GEL dental gel, Place 1 application onto teeth at bedtime., Disp: 120 mL, Rfl: 11   Flaxseed, Linseed, (FLAXSEED OIL) 1000 MG CAPS, Take 1,000 mg by mouth daily.  (Patient not taking: Reported on 01/21/2021), Disp: , Rfl:   Objective Blood pressure 134/77, pulse 67, height 5' 2.5" (1.588 m), weight 196 lb (88.9 kg).  ***  Pertinent ROS ***  Labs or studies ***    Impression Diagnoses this Encounter:: No diagnosis found.  Established relevant diagnosis(es): ***  Plan/Recommendations: No orders of the defined types were placed in this encounter.   Labs or Scans Ordered: No orders of the defined types were placed in this encounter.   Management:: ***  Follow up No follow-ups on file.     [id number]: 10 min  [id number]: 15 min      [id number]:25 min}   Face to face time:  *** minutes  Greater than 50% of the visit time was spent in counseling and coordination of care with the patient.  The summary and outline of the counseling and care coordination is summarized in the note above.   All questions were answered.

## 2021-03-28 ENCOUNTER — Ambulatory Visit (INDEPENDENT_AMBULATORY_CARE_PROVIDER_SITE_OTHER): Payer: Medicare HMO

## 2021-03-28 ENCOUNTER — Other Ambulatory Visit: Payer: Self-pay

## 2021-03-28 DIAGNOSIS — Z23 Encounter for immunization: Secondary | ICD-10-CM | POA: Diagnosis not present

## 2021-06-05 ENCOUNTER — Encounter: Payer: Self-pay | Admitting: Nurse Practitioner

## 2021-06-05 ENCOUNTER — Ambulatory Visit (INDEPENDENT_AMBULATORY_CARE_PROVIDER_SITE_OTHER): Payer: Medicare HMO | Admitting: Nurse Practitioner

## 2021-06-05 VITALS — BP 134/82 | HR 73 | Temp 96.8°F | Resp 20 | Ht 62.0 in | Wt 197.0 lb

## 2021-06-05 DIAGNOSIS — R809 Proteinuria, unspecified: Secondary | ICD-10-CM | POA: Diagnosis not present

## 2021-06-05 DIAGNOSIS — F5101 Primary insomnia: Secondary | ICD-10-CM

## 2021-06-05 DIAGNOSIS — E039 Hypothyroidism, unspecified: Secondary | ICD-10-CM | POA: Diagnosis not present

## 2021-06-05 DIAGNOSIS — I10 Essential (primary) hypertension: Secondary | ICD-10-CM | POA: Diagnosis not present

## 2021-06-05 DIAGNOSIS — F419 Anxiety disorder, unspecified: Secondary | ICD-10-CM | POA: Diagnosis not present

## 2021-06-05 DIAGNOSIS — F3341 Major depressive disorder, recurrent, in partial remission: Secondary | ICD-10-CM | POA: Diagnosis not present

## 2021-06-05 DIAGNOSIS — E1129 Type 2 diabetes mellitus with other diabetic kidney complication: Secondary | ICD-10-CM

## 2021-06-05 DIAGNOSIS — E785 Hyperlipidemia, unspecified: Secondary | ICD-10-CM

## 2021-06-05 DIAGNOSIS — Z6833 Body mass index (BMI) 33.0-33.9, adult: Secondary | ICD-10-CM

## 2021-06-05 DIAGNOSIS — F410 Panic disorder [episodic paroxysmal anxiety] without agoraphobia: Secondary | ICD-10-CM | POA: Diagnosis not present

## 2021-06-05 LAB — BAYER DCA HB A1C WAIVED: HB A1C (BAYER DCA - WAIVED): 5.8 % — ABNORMAL HIGH (ref 4.8–5.6)

## 2021-06-05 MED ORDER — FENOFIBRATE 160 MG PO TABS: 160.0000 mg | ORAL_TABLET | Freq: Every day | ORAL | 1 refills | Status: DC

## 2021-06-05 MED ORDER — LOSARTAN POTASSIUM 100 MG PO TABS: 100.0000 mg | ORAL_TABLET | Freq: Every day | ORAL | 1 refills | Status: DC

## 2021-06-05 MED ORDER — LEVOTHYROXINE SODIUM 75 MCG PO TABS: ORAL_TABLET | ORAL | 1 refills | Status: DC

## 2021-06-05 MED ORDER — ALPRAZOLAM 0.5 MG PO TABS: 0.5000 mg | ORAL_TABLET | Freq: Two times a day (BID) | ORAL | 1 refills | Status: DC

## 2021-06-05 NOTE — Progress Notes (Signed)
Subjective:    Patient ID: [name], female    DOB: Mar 30, 1956, 66 y.o.   MRN: [id number]   Chief Complaint: Medical Management of Chronic Issues    HPI:  [name] is a 66 y.o. who identifies as a female who was assigned female at birth.   Social history: Lives with: husband Work history: retired- has to take cre of her husband   Comes in today for follow up of the following chronic medical issues:  1. Essential hypertension No c/o chest pain, sob or headache. Does not check blood pressure at home. BP Readings from Last 3 Encounters:  06/05/21 (!) 146/92  01/21/21 134/77  12/03/20 122/78     2. Hyperlipidemia with target LDL less than 100 Does not really wathc diet and does no dedicated exercise. Refuses to take a statin. Lab Results  Component Value Date   CHOL 203 (H) 12/03/2020   HDL 36 (L) 12/03/2020   LDLCALC 134 (H) 12/03/2020   TRIG 186 (H) 12/03/2020   CHOLHDL 5.6 (H) 12/03/2020   The 10-year ASCVD risk score (Arnett DK, et al., 2019) is: 35.7%   3. Type 2 diabetes mellitus with microalbuminuria, without long-term current use of insulin (HCC) She does not check his blood sugars at home. Lab Results  Component Value Date   HGBA1C 5.6 12/03/2020     4. Acquired hypothyroidism No problems that aware of Lab Results  Component Value Date   TSH 3.430 12/03/2020    5. Panic attacks Has a very anxious personality and is on xanax bid. GAD 7 : Generalized Anxiety Score 06/05/2021 12/03/2020 06/03/2020 08/29/2019  Nervous, Anxious, on Edge 1 3 1 3   Control/stop worrying 1 3 2 2   Worry too much - different things 1 3 2 2   Trouble relaxing 0 0 0 2  Restless 0 0 0 0  Easily annoyed or irritable 0 0 0 0  Afraid - awful might happen 3 3 3 3   Total GAD 7 Score 6 12 8 12   Anxiety Difficulty Not difficult at all Not difficult at all Somewhat difficult Not difficult at all      6. Recurrent major depressive disorder, in partial remission ([place]) Says  she is doing well without an antidepressant Depression screen [facility] 2/9 06/05/2021 12/03/2020 06/03/2020  Decreased Interest 1 0 0  Down, Depressed, Hopeless 1 1 0  PHQ - 2 Score 2 1 0  Altered sleeping 3 3 0  Tired, decreased energy 1 1 0  Change in appetite 1 1 0  Feeling bad or failure about yourself  0 0 0  Trouble concentrating 0 0 0  Moving slowly or fidgety/restless 0 0 0  Suicidal thoughts 0 0 0  PHQ-9 Score 7 6 0  Difficult doing work/chores Not difficult at all Not difficult at all Not difficult at all  Some recent data might be hidden     7. Anxiety Again is on xanax BID  8. Primary insomnia Sleeps well some nights. Her husband keeps her up at night  9. BMI 33.0-33.9,adult No recent weight changes Wt Readings from Last 3 Encounters:  06/05/21 197 lb (89.4 kg)  01/21/21 196 lb (88.9 kg)  12/03/20 189 lb (85.7 kg)   BMI Readings from Last 3 Encounters:  06/05/21 36.03 kg/m  01/21/21 35.28 kg/m  12/03/20 34.57 kg/m      New complaints: None today  Allergies  Allergen Reactions   Azithromycin Diarrhea   Sulfa Antibiotics Other (See Comments)  Unable to recall   Outpatient Encounter Medications as of 06/05/2021  Medication Sig   ALPRAZolam (XANAX) 0.5 MG tablet Take 1 tablet (0.5 mg total) by mouth 2 (two) times daily.   Apple Cid Vn-Grn Tea-Bit Or-Cr (APPLE CIDER VINEGAR PLUS) TABS Take 1 tablet by mouth daily.    fenofibrate 160 MG tablet Take 1 tablet (160 mg total) by mouth daily.   HAWTHORNE BERRY PO Take 565 mg by mouth daily.    INOSITOL PO Take 1 capsule by mouth daily.   levothyroxine (SYNTHROID) 75 MCG tablet TAKE 1 TABLET EVERY DAY BEFORE BREAKFAST   losartan (COZAAR) 100 MG tablet Take 1 tablet (100 mg total) by mouth daily.   Lysine 500 MG CAPS Take 500 mg by mouth 2 (two) times a week.    Milk Thistle 250 MG CAPS Take 250 mg by mouth daily.    Multiple Vitamins-Minerals (CENTRUM SILVER PO) Take by mouth.   OVER THE COUNTER MEDICATION Take 1  capsule by mouth 2 (two) times daily. Liva Plex Supplement   OVER THE COUNTER MEDICATION Take 3 tablets by mouth 3 (three) times daily. Cata Plex Supplement   sodium fluoride (DENTAGEL) 1.1 % GEL dental gel Place 1 application onto teeth at bedtime.   [DISCONTINUED] Flaxseed, Linseed, (FLAXSEED OIL) 1000 MG CAPS Take 1,000 mg by mouth daily.  (Patient not taking: Reported on 01/21/2021)   No facility-administered encounter medications on file as of 06/05/2021.    Past Surgical History:  Procedure Laterality Date   COLON RESECTION     COLONOSCOPY     04/2020   KNEE SURGERY     oral surgery tooth extraction     TONSILLECTOMY     TOTAL ABDOMINAL HYSTERECTOMY     VULVECTOMY PARTIAL Bilateral 03/30/2018   Procedure: LASER PARTIAL VULVECTOMY FOR VULVUL INTRAEPITHELIA NEOPLASIS III;  Surgeon: Florian Buff, MD;  Location: [facility];  Service: Gynecology;  Laterality: Bilateral;    Family History  Problem Relation Age of Onset   Cancer Maternal Grandfather        lung   Heart attack Father    Heart failure Father    Cancer Mother 43       breast    Cancer Maternal Aunt 40       pancreas   Mental illness Daughter       Controlled substance contract: 12/09/20     Review of Systems  Constitutional:  Negative for diaphoresis.  Eyes:  Negative for pain.  Respiratory:  Negative for shortness of breath.   Cardiovascular:  Negative for chest pain, palpitations and leg swelling.  Gastrointestinal:  Negative for abdominal pain.  Endocrine: Negative for polydipsia.  Skin:  Negative for rash.  Neurological:  Negative for dizziness, weakness and headaches.  Hematological:  Does not bruise/bleed easily.  All other systems reviewed and are negative.     Objective:   Physical Exam Vitals and nursing note reviewed.  Constitutional:      General: She is not in acute distress.    Appearance: Normal appearance. She is well-developed.  HENT:     Head: Normocephalic.     Right Ear: Tympanic  membrane normal.     Left Ear: Tympanic membrane normal.     Nose: Nose normal.     Mouth/Throat:     Mouth: Mucous membranes are moist.  Eyes:     Pupils: Pupils are equal, round, and reactive to light.  Neck:     Vascular: No carotid bruit or  JVD.  Cardiovascular:     Rate and Rhythm: Normal rate and regular rhythm.     Heart sounds: Normal heart sounds.  Pulmonary:     Effort: Pulmonary effort is normal. No respiratory distress.     Breath sounds: Normal breath sounds. No wheezing or rales.  Chest:     Chest wall: No tenderness.  Abdominal:     General: Bowel sounds are normal. There is no distension or abdominal bruit.     Palpations: Abdomen is soft. There is no hepatomegaly, splenomegaly, mass or pulsatile mass.     Tenderness: There is no abdominal tenderness.  Musculoskeletal:        General: Normal range of motion.     Cervical back: Normal range of motion and neck supple.  Lymphadenopathy:     Cervical: No cervical adenopathy.  Skin:    General: Skin is warm and dry.  Neurological:     Mental Status: She is alert and oriented to person, place, and time.     Deep Tendon Reflexes: Reflexes are normal and symmetric.  Psychiatric:        Behavior: Behavior normal.        Thought Content: Thought content normal.        Judgment: Judgment normal.    BP 134/82    Pulse 73    Temp (!) 96.8 F (36 C) (Temporal)    Resp 20    Ht 5' 2"  (1.575 m)    Wt 197 lb (89.4 kg)    SpO2 94%    BMI 36.03 kg/m         Assessment & Plan:  COLBY CATANESE comes in today with chief complaint of Medical Management of Chronic Issues   Diagnosis and orders addressed:  1. Essential hypertension Low sodium diet - losartan (COZAAR) 100 MG tablet; Take 1 tablet (100 mg total) by mouth daily.  Dispense: 90 tablet; Refill: 1 - CBC with Differential/Platelet - CMP14+EGFR  2. Hyperlipidemia with target LDL less than 100 Low fat diet - fenofibrate 160 MG tablet; Take 1 tablet (160 mg  total) by mouth daily.  Dispense: 90 tablet; Refill: 1 - Lipid panel  3. Type 2 diabetes mellitus with microalbuminuria, without long-term current use of insulin (HCC) Labs pending - Bayer DCA Hb A1c Waived  4. Acquired hypothyroidism Labs oending - levothyroxine (SYNTHROID) 75 MCG tablet; TAKE 1 TABLET EVERY DAY BEFORE BREAKFAST  Dispense: 90 tablet; Refill: 1 - Thyroid Panel With TSH  5. Panic attacks Stress management  6. Recurrent major depressive disorder, in partial remission ([place])  7. Anxiety - ALPRAZolam (XANAX) 0.5 MG tablet; Take 1 tablet (0.5 mg total) by mouth 2 (two) times daily.  Dispense: 180 tablet; Refill: 1  8. Primary insomnia Bedtime routine  9. BMI 33.0-33.9,adult Discussed diet and exercise for person with BMI >25 Will recheck weight in 3-6 months    Labs pending Health Maintenance reviewed Diet and exercise encouraged  Follow up plan: 6 months   [name] Done, FNP

## 2021-06-06 LAB — CMP14+EGFR
ALT: 16 IU/L (ref 0–32)
AST: 16 IU/L (ref 0–40)
Albumin/Globulin Ratio: 2.4 — ABNORMAL HIGH (ref 1.2–2.2)
Albumin: 4.7 g/dL (ref 3.8–4.8)
Alkaline Phosphatase: 47 IU/L (ref 44–121)
BUN/Creatinine Ratio: 21 (ref 12–28)
BUN: 23 mg/dL (ref 8–27)
Bilirubin Total: 0.3 mg/dL (ref 0.0–1.2)
CO2: 22 mmol/L (ref 20–29)
Calcium: 9.9 mg/dL (ref 8.7–10.3)
Chloride: 104 mmol/L (ref 96–106)
Creatinine, Ser: 1.11 mg/dL — ABNORMAL HIGH (ref 0.57–1.00)
Globulin, Total: 2 g/dL (ref 1.5–4.5)
Glucose: 99 mg/dL (ref 70–99)
Potassium: 4.2 mmol/L (ref 3.5–5.2)
Sodium: 140 mmol/L (ref 134–144)
Total Protein: 6.7 g/dL (ref 6.0–8.5)
eGFR: 55 mL/min/{1.73_m2} — ABNORMAL LOW (ref >59)

## 2021-06-06 LAB — LIPID PANEL
Chol/HDL Ratio: 5.1 ratio — ABNORMAL HIGH (ref 0.0–4.4)
Cholesterol, Total: 195 mg/dL (ref 100–199)
HDL: 38 mg/dL — ABNORMAL LOW (ref >39)
LDL Chol Calc (NIH): 133 mg/dL — ABNORMAL HIGH (ref 0–99)
Triglycerides: 132 mg/dL (ref 0–149)
VLDL Cholesterol Cal: 24 mg/dL (ref 5–40)

## 2021-06-06 LAB — CBC WITH DIFFERENTIAL/PLATELET
Basophils Absolute: 0 10*3/uL (ref 0.0–0.2)
Basos: 1 %
EOS (ABSOLUTE): 0.1 10*3/uL (ref 0.0–0.4)
Eos: 1 %
Hematocrit: 41 % (ref 34.0–46.6)
Hemoglobin: 13.8 g/dL (ref 11.1–15.9)
Immature Grans (Abs): 0 10*3/uL (ref 0.0–0.1)
Immature Granulocytes: 0 %
Lymphocytes Absolute: 1.2 10*3/uL (ref 0.7–3.1)
Lymphs: 20 %
MCH: 29.9 pg (ref 26.6–33.0)
MCHC: 33.7 g/dL (ref 31.5–35.7)
MCV: 89 fL (ref 79–97)
Monocytes Absolute: 0.4 10*3/uL (ref 0.1–0.9)
Monocytes: 6 %
Neutrophils Absolute: 4.2 10*3/uL (ref 1.4–7.0)
Neutrophils: 72 %
Platelets: 214 10*3/uL (ref 150–450)
RBC: 4.62 x10E6/uL (ref 3.77–5.28)
RDW: 12.9 % (ref 11.7–15.4)
WBC: 5.8 10*3/uL (ref 3.4–10.8)

## 2021-06-06 LAB — THYROID PANEL WITH TSH
Free Thyroxine Index: 1.8 (ref 1.2–4.9)
T3 Uptake Ratio: 27 % (ref 24–39)
T4, Total: 6.5 ug/dL (ref 4.5–12.0)
TSH: 2.24 u[IU]/mL (ref 0.450–4.500)

## 2021-06-24 ENCOUNTER — Telehealth: Payer: Self-pay | Admitting: Nurse Practitioner

## 2021-06-24 NOTE — Telephone Encounter (Signed)
No answer unable to leave a message for patient to call back and schedule Medicare Annual Wellness Visit (AWV) to be completed by video or phone.  No hx of AWV eligible for AWVI as of 04/02/2019 per [employer]   Please schedule at anytime with [place] --- Karle Starch  45 Minutes appointment   Any questions, please call me at [phone]

## 2021-07-01 ENCOUNTER — Other Ambulatory Visit: Payer: Self-pay | Admitting: Nurse Practitioner

## 2021-07-24 ENCOUNTER — Telehealth: Payer: Self-pay | Admitting: Nurse Practitioner

## 2021-07-24 NOTE — Telephone Encounter (Signed)
°  No answer unable to leave a  message for patient to call back and schedule Medicare Annual Wellness Visit (AWV) to be completed by video or phone.  No hx of AWV eligible for AWVI per [employer] as of 04/02/2019  Please schedule at anytime with [place] --- Karle Starch  45 Minutes appointment   Any questions, please call me at [phone]

## 2021-08-08 ENCOUNTER — Telehealth: Payer: Self-pay | Admitting: Nurse Practitioner

## 2021-08-08 NOTE — Telephone Encounter (Signed)
Patient declined the Medicare Wellness Visit with [facility]   ?Not comfortable doing by phone -offered to do in office with her provider and she declined ?

## 2021-10-31 ENCOUNTER — Ambulatory Visit (INDEPENDENT_AMBULATORY_CARE_PROVIDER_SITE_OTHER): Payer: Medicare HMO | Admitting: Nurse Practitioner

## 2021-10-31 ENCOUNTER — Encounter: Payer: Self-pay | Admitting: Nurse Practitioner

## 2021-10-31 DIAGNOSIS — J029 Acute pharyngitis, unspecified: Secondary | ICD-10-CM | POA: Diagnosis not present

## 2021-10-31 LAB — CULTURE, GROUP A STREP

## 2021-10-31 LAB — RAPID STREP SCREEN (MED CTR MEBANE ONLY): Strep Gp A Ag, IA W/Reflex: NEGATIVE

## 2021-10-31 NOTE — Patient Instructions (Signed)
Force fluids °Motrin or tylenol OTC °OTC decongestant °Throat lozenges if help °New toothbrush in 3 days ° °

## 2021-10-31 NOTE — Progress Notes (Signed)
   Virtual Visit  Note Due to COVID-19 pandemic this visit was conducted virtually. This visit type was conducted due to national recommendations for restrictions regarding the COVID-19 Pandemic (e.g. social distancing, sheltering in place) in an effort to limit this patient's exposure and mitigate transmission in our community. All issues noted in this document were discussed and addressed.  A physical exam was not performed with this format.  I connected with [name] on 10/31/21 at 10:49 by telephone and verified that I am speaking with the correct person using two identifiers. [name] is currently located at home and no one is currently with her during visit. The provider, [name] Done, FNP is located in their office at time of visit.  I discussed the limitations, risks, security and privacy concerns of performing an evaluation and management service by telephone and the availability of in person appointments. I also discussed with the patient that there may be a patient responsible charge related to this service. The patient expressed understanding and agreed to proceed.   History and Present Illness:  Patient has been waking up with sore thorat the last several mornings. She has been using honey which has helped. She denies fever, but has had cough and congestion the  last few days. She is afraid that she may have strep. She as been exposed to it by her grandkids. Not sore now.     Review of Systems  Constitutional:  Negative for chills and fever.  HENT:  Positive for congestion.   Respiratory:  Negative for cough.   Musculoskeletal:  Negative for myalgias.  Neurological:  Negative for headaches.    Observations/Objective: Alert and oriented- answers all questions appropriately No distress   Assessment and Plan: [name] in today with chief complaint of No chief complaint on file.   1. Pharyngitis, unspecified etiology Patient thinks sore throat is from  dry mouth Force fluids Motrin or tylenol OTC OTC decongestant Throat lozenges if help New toothbrush in 3 days  Strep test pending   Follow Up Instructions: prn    I discussed the assessment and treatment plan with the patient. The patient was provided an opportunity to ask questions and all were answered. The patient agreed with the plan and demonstrated an understanding of the instructions.   The patient was advised to call back or seek an in-person evaluation if the symptoms worsen or if the condition fails to improve as anticipated.  The above assessment and management plan was discussed with the patient. The patient verbalized understanding of and has agreed to the management plan. Patient is aware to call the clinic if symptoms persist or worsen. Patient is aware when to return to the clinic for a follow-up visit. Patient educated on when it is appropriate to go to the emergency department.   Time call ended:  11:00  I provided 11 minutes of  non face-to-face time during this encounter.    [name] Done, FNP

## 2021-12-03 NOTE — Patient Instructions (Signed)
Our records indicate that you are due for your annual mammogram/breast imaging. While there is no way to prevent breast cancer, early detection provides the best opportunity for curing it. For women over the age of 40, the American Cancer Society recommends a yearly clinical breast exam and a yearly mammogram. These practices have saved thousands of lives. We need your help to ensure that you are receiving optimal medical care. Please call the imaging location that has done you previous mammograms. Please remember to list us as your primary care. This helps make sure we receive a report and can update your chart.  Below is the contact information for several local breast imaging centers. You may call the location that works best for you, and they will be happy to assistance in making you an appointment. You do not need an order for a regular screening mammogram. However, if you are having any problems or concerns with you breast area, please let your primary care provider know, and appropriate orders will be placed. Please let our office know if you have any questions or concerns. Or if you need information for another imaging center not on this list or outside of the area. We are commented to working with you on your health care journey.   The mobile unit/bus ([facility]) - they come twice a month to our location.  These appointments can be made through our office or by call The Breast Center  [facility]  [address] [place], [place] [address] Phone [phone]  [facility] Radiology Department  [address]  , [place] [address] [phone]  [facility])  [address] [place], [place] [address] [phone]  [facility] [place]  [address] [place] [address] [phone]  [facility]  [address], [address] [place] [address] [phone]  [facility] in   [address] , [place] [address] [phone]  [facility] [address] [place], [place] [address] [phone]  [facility] [address]  [address] [place], [place] [address] [phone]  [facility] [address] [place] [address] [phone]     

## 2021-12-05 ENCOUNTER — Ambulatory Visit (INDEPENDENT_AMBULATORY_CARE_PROVIDER_SITE_OTHER): Payer: Medicare HMO | Admitting: Nurse Practitioner

## 2021-12-05 ENCOUNTER — Encounter: Payer: Self-pay | Admitting: Nurse Practitioner

## 2021-12-05 VITALS — BP 144/88 | HR 70 | Temp 97.1°F | Resp 20 | Ht 62.0 in | Wt 207.0 lb

## 2021-12-05 DIAGNOSIS — E039 Hypothyroidism, unspecified: Secondary | ICD-10-CM | POA: Diagnosis not present

## 2021-12-05 DIAGNOSIS — F419 Anxiety disorder, unspecified: Secondary | ICD-10-CM

## 2021-12-05 DIAGNOSIS — F410 Panic disorder [episodic paroxysmal anxiety] without agoraphobia: Secondary | ICD-10-CM

## 2021-12-05 DIAGNOSIS — F5101 Primary insomnia: Secondary | ICD-10-CM

## 2021-12-05 DIAGNOSIS — I10 Essential (primary) hypertension: Secondary | ICD-10-CM

## 2021-12-05 DIAGNOSIS — R809 Proteinuria, unspecified: Secondary | ICD-10-CM | POA: Diagnosis not present

## 2021-12-05 DIAGNOSIS — E785 Hyperlipidemia, unspecified: Secondary | ICD-10-CM | POA: Diagnosis not present

## 2021-12-05 DIAGNOSIS — Z6833 Body mass index (BMI) 33.0-33.9, adult: Secondary | ICD-10-CM | POA: Diagnosis not present

## 2021-12-05 DIAGNOSIS — E1129 Type 2 diabetes mellitus with other diabetic kidney complication: Secondary | ICD-10-CM

## 2021-12-05 DIAGNOSIS — F3341 Major depressive disorder, recurrent, in partial remission: Secondary | ICD-10-CM

## 2021-12-05 LAB — LIPID PANEL
Chol/HDL Ratio: 5.6 ratio — ABNORMAL HIGH (ref 0.0–4.4)
Cholesterol, Total: 209 mg/dL — ABNORMAL HIGH (ref 100–199)
HDL: 37 mg/dL — ABNORMAL LOW (ref >39)
LDL Chol Calc (NIH): 141 mg/dL — ABNORMAL HIGH (ref 0–99)
Triglycerides: 171 mg/dL — ABNORMAL HIGH (ref 0–149)
VLDL Cholesterol Cal: 31 mg/dL (ref 5–40)

## 2021-12-05 LAB — CBC WITH DIFFERENTIAL/PLATELET
Basophils Absolute: 0 10*3/uL (ref 0.0–0.2)
Basos: 1 %
EOS (ABSOLUTE): 0.1 10*3/uL (ref 0.0–0.4)
Eos: 2 %
Hematocrit: 40.2 % (ref 34.0–46.6)
Hemoglobin: 13.7 g/dL (ref 11.1–15.9)
Immature Grans (Abs): 0 10*3/uL (ref 0.0–0.1)
Immature Granulocytes: 0 %
Lymphocytes Absolute: 1.2 10*3/uL (ref 0.7–3.1)
Lymphs: 18 %
MCH: 29.8 pg (ref 26.6–33.0)
MCHC: 34.1 g/dL (ref 31.5–35.7)
MCV: 87 fL (ref 79–97)
Monocytes Absolute: 0.4 10*3/uL (ref 0.1–0.9)
Monocytes: 7 %
Neutrophils Absolute: 4.7 10*3/uL (ref 1.4–7.0)
Neutrophils: 72 %
Platelets: 212 10*3/uL (ref 150–450)
RBC: 4.6 x10E6/uL (ref 3.77–5.28)
RDW: 12.7 % (ref 11.7–15.4)
WBC: 6.5 10*3/uL (ref 3.4–10.8)

## 2021-12-05 LAB — CMP14+EGFR
ALT: 21 IU/L (ref 0–32)
AST: 13 IU/L (ref 0–40)
Albumin/Globulin Ratio: 2.1 (ref 1.2–2.2)
Albumin: 4.6 g/dL (ref 3.8–4.8)
Alkaline Phosphatase: 48 IU/L (ref 44–121)
BUN/Creatinine Ratio: 22 (ref 12–28)
BUN: 23 mg/dL (ref 8–27)
Bilirubin Total: 0.4 mg/dL (ref 0.0–1.2)
CO2: 19 mmol/L — ABNORMAL LOW (ref 20–29)
Calcium: 9.8 mg/dL (ref 8.7–10.3)
Chloride: 103 mmol/L (ref 96–106)
Creatinine, Ser: 1.04 mg/dL — ABNORMAL HIGH (ref 0.57–1.00)
Globulin, Total: 2.2 g/dL (ref 1.5–4.5)
Glucose: 127 mg/dL — ABNORMAL HIGH (ref 70–99)
Potassium: 4.2 mmol/L (ref 3.5–5.2)
Sodium: 137 mmol/L (ref 134–144)
Total Protein: 6.8 g/dL (ref 6.0–8.5)
eGFR: 59 mL/min/{1.73_m2} — ABNORMAL LOW (ref >59)

## 2021-12-05 LAB — BAYER DCA HB A1C WAIVED: HB A1C (BAYER DCA - WAIVED): 6 % — ABNORMAL HIGH (ref 4.8–5.6)

## 2021-12-05 MED ORDER — LEVOTHYROXINE SODIUM 75 MCG PO TABS: ORAL_TABLET | ORAL | 1 refills | Status: DC

## 2021-12-05 MED ORDER — FENOFIBRATE 160 MG PO TABS: 160.0000 mg | ORAL_TABLET | Freq: Every day | ORAL | 1 refills | Status: DC

## 2021-12-05 MED ORDER — LOSARTAN POTASSIUM 100 MG PO TABS: 100.0000 mg | ORAL_TABLET | Freq: Every day | ORAL | 1 refills | Status: DC

## 2021-12-05 MED ORDER — SODIUM FLUORIDE 1.1 % DT GEL: DENTAL | 5 refills | Status: DC

## 2021-12-05 MED ORDER — ALPRAZOLAM 0.5 MG PO TABS: 0.5000 mg | ORAL_TABLET | Freq: Two times a day (BID) | ORAL | 1 refills | Status: DC

## 2021-12-05 NOTE — Progress Notes (Signed)
Subjective:    Patient ID: [name], female    DOB: [birth year], 66 y.o.   MRN: [id number]   Chief Complaint: medical management of chronic issues     HPI:  [name] is a 66 y.o. who identifies as a female who was assigned female at birth.   Social history: Lives with: her husband and children and grand children Work history: retired   Scientist, forensic in today for follow up of the following chronic medical issues:  1. Essential hypertension No c/o chest pain, sob or headache. Does not check blood pressure at h ome. BP Readings from Last 3 Encounters:  06/05/21 134/82  01/21/21 134/77  12/03/20 122/78     2. Hyperlipidemia with target LDL less than 100 Does not watch diet and does no exercise. Refuses statin or any other cholesterol treatment. Lab Results  Component Value Date   CHOL 195 06/05/2021   HDL 38 (L) 06/05/2021   LDLCALC 133 (H) 06/05/2021   TRIG 132 06/05/2021   CHOLHDL 5.1 (H) 06/05/2021   The 10-year ASCVD risk score (Arnett DK, et al., 2019) is: 31.6%   3. Type 2 diabetes mellitus with microalbuminuria, without long-term current use of insulin ([place]) She doe snot check her blood sugars at home. Doe snot really watch her diet very closely. Lab Results  Component Value Date   HGBA1C 5.8 (H) 06/05/2021     4. Acquired hypothyroidism No problems that she is aware of. Lab Results  Component Value Date   TSH 2.240 06/05/2021     5. Anxiety 6. Panic attacks Is a constant worrier. Has been her personality her entire life. She is on xanax 2 x a day. Denies any recent panic attacks.    12/05/2021   10:27 AM 06/05/2021   11:43 AM 12/03/2020   10:36 AM 06/03/2020   10:16 AM  GAD 7 : Generalized Anxiety Score  Nervous, Anxious, on Edge 1 1 3 1   Control/stop worrying 1 1 3 2   Worry too much - different things 1 1 3 2   Trouble relaxing 0 0 0 0  Restless 0 0 0 0  Easily annoyed or irritable 0 0 0 0  Afraid - awful might happen 1 3 3 3   Total GAD 7 Score  4 6 12 8   Anxiety Difficulty Not difficult at all Not difficult at all Not difficult at all Somewhat difficult      7. Recurrent major depressive disorder, in partial remission ([place]) Is on no antidepressant.     12/05/2021   10:26 AM 06/05/2021   11:43 AM 12/03/2020   10:36 AM  Depression screen PHQ 2/9  Decreased Interest 1 1 0  Down, Depressed, Hopeless 1 1 1   PHQ - 2 Score 2 2 1   Altered sleeping 1 3 3   Tired, decreased energy 1 1 1   Change in appetite 1 1 1   Feeling bad or failure about yourself  0 0 0  Trouble concentrating 0 0 0  Moving slowly or fidgety/restless 0 0 0  Suicidal thoughts 0 0 0  PHQ-9 Score 5 7 6   Difficult doing work/chores Not difficult at all Not difficult at all Not difficult at all     8. Primary insomnia Is on no prescription sleep aids. She wakes up a lot at night because her husband does not sleep well at night and he wakes her up.  9. BMI 33.0-33.9,adult Weight  is up 10 lbs Wt Readings from Last 3 Encounters:  12/05/21 207 lb (93.9 kg)  06/05/21 197 lb (89.4 kg)  01/21/21 196 lb (88.9 kg)   BMI Readings from Last 3 Encounters:  12/05/21 37.86 kg/m  06/05/21 36.03 kg/m  01/21/21 35.28 kg/m     New complaints: None today  Allergies  Allergen Reactions   Azithromycin Diarrhea   Sulfa Antibiotics Other (See Comments)    Unable to recall   Outpatient Encounter Medications as of 12/05/2021  Medication Sig   ALPRAZolam (XANAX) 0.5 MG tablet Take 1 tablet (0.5 mg total) by mouth 2 (two) times daily.   Apple Cid Vn-Grn Tea-Bit Or-Cr (APPLE CIDER VINEGAR PLUS) TABS Take 1 tablet by mouth daily.    DENTAGEL 1.1 % GEL dental gel APPLY 1 APPLICATION ONTO TEETH AT BEDTIME.   fenofibrate 160 MG tablet Take 1 tablet (160 mg total) by mouth daily.   HAWTHORNE BERRY PO Take 565 mg by mouth daily.    INOSITOL PO Take 1 capsule by mouth daily.   levothyroxine (SYNTHROID) 75 MCG tablet TAKE 1 TABLET EVERY DAY BEFORE BREAKFAST   losartan (COZAAR)  100 MG tablet Take 1 tablet (100 mg total) by mouth daily.   Lysine 500 MG CAPS Take 500 mg by mouth 2 (two) times a week.    Milk Thistle 250 MG CAPS Take 250 mg by mouth daily.    Multiple Vitamins-Minerals (CENTRUM SILVER PO) Take by mouth.   OVER THE COUNTER MEDICATION Take 1 capsule by mouth 2 (two) times daily. Liva Plex Supplement   OVER THE COUNTER MEDICATION Take 3 tablets by mouth 3 (three) times daily. Cata Plex Supplement   No facility-administered encounter medications on file as of 12/05/2021.    Past Surgical History:  Procedure Laterality Date   COLON RESECTION     COLONOSCOPY     04/2020   KNEE SURGERY     oral surgery tooth extraction     TONSILLECTOMY     TOTAL ABDOMINAL HYSTERECTOMY     VULVECTOMY PARTIAL Bilateral 03/30/2018   Procedure: LASER PARTIAL VULVECTOMY FOR VULVUL INTRAEPITHELIA NEOPLASIS III;  Surgeon: Florian Buff, MD;  Location: [facility];  Service: Gynecology;  Laterality: Bilateral;    Family History  Problem Relation Age of Onset   Cancer Maternal Grandfather        lung   Heart attack Father    Heart failure Father    Cancer Mother 82       breast    Cancer Maternal Aunt 40       pancreas   Mental illness Daughter       Controlled substance contract: 12/05/21- urine drug screen done today     Review of Systems  Constitutional:  Negative for diaphoresis.  Eyes:  Negative for pain.  Respiratory:  Negative for shortness of breath.   Cardiovascular:  Negative for chest pain, palpitations and leg swelling.  Gastrointestinal:  Negative for abdominal pain.  Endocrine: Negative for polydipsia.  Skin:  Negative for rash.  Neurological:  Negative for dizziness, weakness and headaches.  Hematological:  Does not bruise/bleed easily.  All other systems reviewed and are negative.      Objective:   Physical Exam Vitals and nursing note reviewed.  Constitutional:      General: She is not in acute distress.    Appearance: Normal appearance.  She is well-developed.  HENT:     Head: Normocephalic.     Right Ear: Tympanic membrane normal.     Left Ear: Tympanic membrane normal.  Nose: Nose normal.     Mouth/Throat:     Mouth: Mucous membranes are moist.  Eyes:     Pupils: Pupils are equal, round, and reactive to light.  Neck:     Vascular: No carotid bruit or JVD.  Cardiovascular:     Rate and Rhythm: Normal rate and regular rhythm.     Heart sounds: Normal heart sounds.  Pulmonary:     Effort: Pulmonary effort is normal. No respiratory distress.     Breath sounds: Normal breath sounds. No wheezing or rales.  Chest:     Chest wall: No tenderness.  Abdominal:     General: Bowel sounds are normal. There is no distension or abdominal bruit.     Palpations: Abdomen is soft. There is no hepatomegaly, splenomegaly, mass or pulsatile mass.     Tenderness: There is no abdominal tenderness.  Musculoskeletal:        General: Normal range of motion.     Cervical back: Normal range of motion and neck supple.  Lymphadenopathy:     Cervical: No cervical adenopathy.  Skin:    General: Skin is warm and dry.  Neurological:     Mental Status: She is alert and oriented to person, place, and time.     Deep Tendon Reflexes: Reflexes are normal and symmetric.  Psychiatric:        Behavior: Behavior normal.        Thought Content: Thought content normal.        Judgment: Judgment normal.     BP (!) 144/88   Pulse 70   Temp (!) 97.1 F (36.2 C) (Temporal)   Resp 20   Ht 5' 2"  (1.575 m)   Wt 207 lb (93.9 kg)   SpO2 93%   BMI 37.86 kg/m   HGBA1c 6.0     Assessment & Plan:   CHRYSTIE HAGWOOD comes in today with chief complaint of Medical Management of Chronic Issues   Diagnosis and orders addressed:  1. Essential hypertension Low sodium diet - CBC with Differential/Platelet - CMP14+EGFR - losartan (COZAAR) 100 MG tablet; Take 1 tablet (100 mg total) by mouth daily.  Dispense: 90 tablet; Refill: 1  2. Hyperlipidemia  with target LDL less than 100 Low fat diet - Lipid panel - fenofibrate 160 MG tablet; Take 1 tablet (160 mg total) by mouth daily.  Dispense: 90 tablet; Refill: 1  3. Type 2 diabetes mellitus with microalbuminuria, without long-term current use of insulin ([facility]) Continue to watch carbs in diet - Bayer DCA Hb A1c Waived  4. Acquired hypothyroidism Labs pending - levothyroxine (SYNTHROID) 75 MCG tablet; TAKE 1 TABLET EVERY DAY BEFORE BREAKFAST  Dispense: 90 tablet; Refill: 1  5. Anxiety Stress maangeemnt - ALPRAZolam (XANAX) 0.5 MG tablet; Take 1 tablet (0.5 mg total) by mouth 2 (two) times daily.  Dispense: 180 tablet; Refill: 1  6. Panic attacks  7. Recurrent major depressive disorder, in partial remission ([place]) Stress management  8. Primary insomnia Bedtime routine  9. BMI 33.0-33.9,adult Discussed diet and exercise for person with BMI >25 Will recheck weight in 3-6 months    Labs pending Health Maintenance reviewed- will schedule eye exam Diet and exercise encouraged  Follow up plan: 6 months   [name] Done, FNP

## 2021-12-05 NOTE — Addendum Note (Signed)
Addended by: Rolena Infante on: 12/05/2021 04:05 PM   Modules accepted: Orders

## 2021-12-05 NOTE — Addendum Note (Signed)
Addended by: Rolena Infante on: 12/05/2021 10:59 AM   Modules accepted: Orders

## 2021-12-10 LAB — TOXASSURE SELECT 13 (MW), URINE

## 2021-12-27 DIAGNOSIS — H524 Presbyopia: Secondary | ICD-10-CM | POA: Diagnosis not present

## 2021-12-27 DIAGNOSIS — H5213 Myopia, bilateral: Secondary | ICD-10-CM | POA: Diagnosis not present

## 2021-12-27 LAB — HM DIABETES EYE EXAM

## 2022-01-08 ENCOUNTER — Encounter: Payer: Self-pay | Admitting: Nurse Practitioner

## 2022-01-08 ENCOUNTER — Ambulatory Visit (INDEPENDENT_AMBULATORY_CARE_PROVIDER_SITE_OTHER): Payer: Medicare HMO | Admitting: Nurse Practitioner

## 2022-01-08 VITALS — BP 182/98 | HR 93 | Temp 97.0°F | Resp 20 | Ht 62.0 in | Wt 211.0 lb

## 2022-01-08 DIAGNOSIS — E039 Hypothyroidism, unspecified: Secondary | ICD-10-CM | POA: Diagnosis not present

## 2022-01-08 DIAGNOSIS — I10 Essential (primary) hypertension: Secondary | ICD-10-CM

## 2022-01-08 MED ORDER — HYDROCHLOROTHIAZIDE 25 MG PO TABS: 25.0000 mg | ORAL_TABLET | Freq: Every day | ORAL | 0 refills | Status: DC

## 2022-01-08 MED ORDER — HYDROCHLOROTHIAZIDE 25 MG PO TABS: 25.0000 mg | ORAL_TABLET | Freq: Every day | ORAL | 3 refills | Status: DC

## 2022-01-08 NOTE — Addendum Note (Signed)
Addended by: Chevis Pretty on: 01/08/2022 09:40 AM   Modules accepted: Orders

## 2022-01-08 NOTE — Patient Instructions (Signed)

## 2022-01-08 NOTE — Progress Notes (Signed)
   Subjective:    Patient ID: [name], female    DOB: [birth year], 66 y.o.   MRN: [id number]   Chief Complaint: blood pressure elevated at home and Wants thyroid checked   HPI Patient come sin with 2 complaints: - elevated blood pressure at home. Running around 409'B dystolic. No chest pain, sob or headache.   BP Readings from Last 3 Encounters:  01/08/22 (!) 205/98  12/05/21 (!) 144/88  06/05/21 134/82   - wants tsh checked. Was not checked with last labs. Lab Results  Component Value Date   TSH 2.240 06/05/2021        Review of Systems  Constitutional:  Negative for diaphoresis and fatigue.  Eyes:  Negative for pain.  Respiratory:  Negative for shortness of breath.   Cardiovascular:  Negative for chest pain, palpitations and leg swelling.  Gastrointestinal:  Negative for abdominal pain.  Endocrine: Negative for polydipsia.  Skin:  Negative for rash.  Neurological:  Negative for dizziness, weakness and headaches.  Hematological:  Does not bruise/bleed easily.  All other systems reviewed and are negative.      Objective:   Physical Exam Constitutional:      Appearance: Normal appearance.  Cardiovascular:     Rate and Rhythm: Normal rate and regular rhythm.     Heart sounds: Normal heart sounds.  Pulmonary:     Effort: Pulmonary effort is normal.     Breath sounds: Normal breath sounds.  Musculoskeletal:     Right lower leg: Edema (1+) present.     Left lower leg: Edema (1+) present.  Skin:    General: Skin is warm.  Neurological:     General: No focal deficit present.     Mental Status: She is alert and oriented to person, place, and time.  Psychiatric:        Mood and Affect: Mood normal.        Behavior: Behavior normal.    BP (!) 182/98   Pulse 93   Temp (!) 97 F (36.1 C) (Temporal)   Resp 20   Ht '5\' 2"'$  (1.575 m)   Wt 211 lb (95.7 kg)   SpO2 97%   BMI 38.59 kg/m   EKG- NSR -[name] Done, FNP       Assessment & Plan:   [name] in today with chief complaint of blood pressure elevated at home and Wants thyroid checked   1. Essential hypertension DO NOT ADD SALT TO DIET Will start on HCTZ- doe snot want to try anything else right now. Keep daily of blood pressure at home. - hydrochlorothiazide (HYDRODIURIL) 25 MG tablet; Take 1 tablet (25 mg total) by mouth daily.  Dispense: 90 tablet; Refill: 3  Stop all herbs for now.  The above assessment and management plan was discussed with the patient. The patient verbalized understanding of and has agreed to the management plan. Patient is aware to call the clinic if symptoms persist or worsen. Patient is aware when to return to the clinic for a follow-up visit. Patient educated on when it is appropriate to go to the emergency department.   [name] Done, FNP

## 2022-01-09 LAB — CMP14+EGFR
ALT: 24 IU/L (ref 0–32)
AST: 16 IU/L (ref 0–40)
Albumin/Globulin Ratio: 1.9 (ref 1.2–2.2)
Albumin: 4.8 g/dL (ref 3.9–4.9)
Alkaline Phosphatase: 54 IU/L (ref 44–121)
BUN/Creatinine Ratio: 19 (ref 12–28)
BUN: 20 mg/dL (ref 8–27)
Bilirubin Total: 0.3 mg/dL (ref 0.0–1.2)
CO2: 19 mmol/L — ABNORMAL LOW (ref 20–29)
Calcium: 9.8 mg/dL (ref 8.7–10.3)
Chloride: 105 mmol/L (ref 96–106)
Creatinine, Ser: 1.07 mg/dL — ABNORMAL HIGH (ref 0.57–1.00)
Globulin, Total: 2.5 g/dL (ref 1.5–4.5)
Glucose: 134 mg/dL — ABNORMAL HIGH (ref 70–99)
Potassium: 4.3 mmol/L (ref 3.5–5.2)
Sodium: 142 mmol/L (ref 134–144)
Total Protein: 7.3 g/dL (ref 6.0–8.5)
eGFR: 57 mL/min/{1.73_m2} — ABNORMAL LOW (ref >59)

## 2022-01-09 LAB — THYROID PANEL WITH TSH
Free Thyroxine Index: 1.9 (ref 1.2–4.9)
T3 Uptake Ratio: 31 % (ref 24–39)
T4, Total: 6.1 ug/dL (ref 4.5–12.0)
TSH: 3.86 u[IU]/mL (ref 0.450–4.500)

## 2022-01-22 ENCOUNTER — Ambulatory Visit (INDEPENDENT_AMBULATORY_CARE_PROVIDER_SITE_OTHER): Payer: Medicare HMO | Admitting: Nurse Practitioner

## 2022-01-22 ENCOUNTER — Encounter: Payer: Self-pay | Admitting: Nurse Practitioner

## 2022-01-22 VITALS — BP 117/76 | HR 88 | Temp 97.2°F | Ht 62.0 in | Wt 200.2 lb

## 2022-01-22 DIAGNOSIS — I10 Essential (primary) hypertension: Secondary | ICD-10-CM | POA: Diagnosis not present

## 2022-01-22 DIAGNOSIS — R0789 Other chest pain: Secondary | ICD-10-CM | POA: Diagnosis not present

## 2022-01-22 NOTE — Patient Instructions (Signed)

## 2022-01-22 NOTE — Progress Notes (Signed)
   Subjective:    Patient ID: [name], female    DOB: 12-26-1955, 66 y.o.   MRN: [id number]   Chief Complaint: Hypertension   HPI Patient was seen on 01/08/22 with elevated blood pressure readings at home.  We started her on HCTZ '25mg'$  daiy. AT that time patient did not want to try anything else. She has been checking her blood pressure at home and it has been running around 623'J systolic. Has had sme inter,ittent chest tightness when walking on treadmill.   Review of Systems  Constitutional:  Negative for diaphoresis.  Eyes:  Negative for pain.  Respiratory:  Negative for shortness of breath.   Cardiovascular:  Positive for chest pain. Negative for palpitations and leg swelling.  Gastrointestinal:  Negative for abdominal pain.  Endocrine: Negative for polydipsia.  Skin:  Negative for rash.  Neurological:  Negative for dizziness, weakness and headaches.  Hematological:  Does not bruise/bleed easily.  All other systems reviewed and are negative.      Objective:   Physical Exam Constitutional:      Appearance: Normal appearance. She is obese.  Cardiovascular:     Rate and Rhythm: Normal rate and regular rhythm.     Heart sounds: Normal heart sounds.  Pulmonary:     Effort: Pulmonary effort is normal.     Breath sounds: Normal breath sounds.  Skin:    General: Skin is warm.  Neurological:     General: No focal deficit present.     Mental Status: She is alert and oriented to person, place, and time.  Psychiatric:        Mood and Affect: Mood normal.        Behavior: Behavior normal.    BP 117/76 Comment: at home  Pulse 88   Temp (!) 97.2 F (36.2 C) (Skin)   Ht '5\' 2"'$  (1.575 m)   Wt 200 lb 3.2 oz (90.8 kg)   BMI 36.62 kg/m   EKG- Kerry Hough, FNP       Assessment & Plan:  [name] in today with chief complaint of Hypertension   1. Chest tightness - Ambulatory referral to Cardiology - EKG 12-Lead  2. Essential hypertension Continue  HCTZ' continue to check blood pressure at home Keep scheduled follow up appointment    The above assessment and management plan was discussed with the patient. The patient verbalized understanding of and has agreed to the management plan. Patient is aware to call the clinic if symptoms persist or worsen. Patient is aware when to return to the clinic for a follow-up visit. Patient educated on when it is appropriate to go to the emergency department.   [name] Done, FNP

## 2022-01-30 ENCOUNTER — Other Ambulatory Visit: Payer: Self-pay | Admitting: Nurse Practitioner

## 2022-01-30 DIAGNOSIS — I10 Essential (primary) hypertension: Secondary | ICD-10-CM

## 2022-03-23 ENCOUNTER — Ambulatory Visit: Payer: Medicare HMO | Admitting: Obstetrics & Gynecology

## 2022-03-23 ENCOUNTER — Encounter: Payer: Self-pay | Admitting: Obstetrics & Gynecology

## 2022-03-23 VITALS — BP 132/80 | HR 73 | Ht 63.0 in | Wt 206.0 lb

## 2022-03-23 DIAGNOSIS — D071 Carcinoma in situ of vulva: Secondary | ICD-10-CM | POA: Diagnosis not present

## 2022-03-23 NOTE — Progress Notes (Signed)
Follow up appointment for follow up: [facility] CIS 2019  Chief Complaint  Patient presents with   Follow-up    Follow up on vulvar intraepithelial neoplasia III    Blood pressure 132/80, pulse 73, height '5\' 3"'$  (1.6 m), weight 206 lb (93.4 kg).  Pt s/p laser vulvectomy 03/2018 for Vulvar CIS  NED since then Yearly evals  Exam Vulvar tissue examined with NED Some sebaceous cysts present  MEDS ordered this encounter: No orders of the defined types were placed in this encounter.   Orders for this encounter: No orders of the defined types were placed in this encounter.   Impression + Management Plan   ICD-10-CM   1. VIN III (vulvar intraepithelial neoplasia III): S/P laser ablation 03/2018, NED 03/23/22  D07.1       Follow Up: Return in about 1 year (around 03/24/2023) for Follow up, with Dr Elonda Husky.     All questions were answered.  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,consistent w/high grade mucoepidermoid ca   Chronic hoarseness    years   Dysphagia    Hypertension    Hypertriglyceridemia     Past Surgical History:  Procedure Laterality Date   COLON RESECTION     COLONOSCOPY     04/2020   KNEE SURGERY     oral surgery tooth extraction     TONSILLECTOMY     TOTAL ABDOMINAL HYSTERECTOMY     VULVECTOMY PARTIAL Bilateral 03/30/2018   Procedure: LASER PARTIAL VULVECTOMY FOR VULVUL INTRAEPITHELIA NEOPLASIS III;  Surgeon: Florian Buff, MD;  Location: [facility];  Service: Gynecology;  Laterality: Bilateral;    OB History     Gravida  1   Para      Term      Preterm      AB      Living  1      SAB      IAB      Ectopic      Multiple      Live Births  1           Allergies  Allergen Reactions   Azithromycin Diarrhea   Sulfa Antibiotics Other (See Comments)    Unable to recall    Social History   Socioeconomic History    Marital status: Married    Spouse name: Not on file   Number of children: 1   Years of education: Not on file   Highest education level: Not on file  Occupational History    Employer: [employer]  Tobacco Use   Smoking status: Some Days    Packs/day: 0.25    Years: 40.00    Total pack years: 10.00    Types: Cigarettes   Smokeless tobacco: Never  Vaping Use   Vaping Use: Never used  Substance and Sexual Activity   Alcohol use: Not Currently   Drug use: No   Sexual activity: Not Currently    Birth control/protection: Surgical    Comment: hyst  Other Topics Concern   Not on file  Social History Narrative   Not on file   Social Determinants of Health   Financial Resource Strain: Not on file  Food Insecurity: Not on file  Transportation Needs: Not on file  Physical Activity: Not on file  Stress: Not on file  Social Connections: Not on file    Family  History  Problem Relation Age of Onset   Cancer Maternal Grandfather        lung   Heart attack Father    Heart failure Father    Cancer Mother 58       breast    Cancer Maternal Aunt 12       pancreas   Mental illness Daughter

## 2022-03-27 ENCOUNTER — Encounter: Payer: Self-pay | Admitting: Cardiology

## 2022-03-27 ENCOUNTER — Telehealth: Payer: Self-pay | Admitting: Cardiology

## 2022-03-27 ENCOUNTER — Ambulatory Visit: Payer: Medicare HMO | Attending: Cardiology | Admitting: Cardiology

## 2022-03-27 VITALS — BP 152/98 | HR 85 | Ht 63.0 in | Wt 205.6 lb

## 2022-03-27 DIAGNOSIS — I10 Essential (primary) hypertension: Secondary | ICD-10-CM | POA: Diagnosis not present

## 2022-03-27 DIAGNOSIS — E782 Mixed hyperlipidemia: Secondary | ICD-10-CM

## 2022-03-27 DIAGNOSIS — R079 Chest pain, unspecified: Secondary | ICD-10-CM | POA: Diagnosis not present

## 2022-03-27 NOTE — Patient Instructions (Signed)
Medication Instructions:  Your physician recommends that you continue on your current medications as directed. Please refer to the Current Medication list given to you today.   Labwork: none  Testing/Procedures: Your physician has requested that you have en exercise stress myoview. For further information please visit [email or web address]. Please follow instruction sheet, as given.   Follow-Up:  Your physician recommends that you schedule a follow-up appointment in: Follow Up Pending  Any Other Special Instructions Will Be Listed Below (If Applicable).  If you need a refill on your cardiac medications before your next appointment, please call your pharmacy.

## 2022-03-27 NOTE — Telephone Encounter (Signed)
Checking percert on the following patient for testing scheduled at [facility].     Exercise Myo dx: exertional chest pain   03-31-2022

## 2022-03-27 NOTE — Progress Notes (Signed)
Cardiology Office Note  Date: 03/27/2022   ID: [name], DOB 11-28-1955, MRN [id number]  PCP:  Chevis Pretty, FNP  Cardiologist:  Rozann Lesches, MD Electrophysiologist:  None   Chief Complaint  Patient presents with   Chest pain    History of Present Illness: [name] is a 66 y.o. female referred for cardiology consultation by Ms. Hassell Done NP for evaluation of chest pain.  I reviewed the available records.  She is here today with her husband for whom she is primary caregiver.  She states that back in April she had an episode of arm squeezing and chest tightness when she was walking uphill.  Had another similar event in May.  In the interim has had trouble controlling her blood pressure, had been on Cozaar 100 mg daily and then HCTZ was added in August.  She has been walking on the treadmill recently for exercise, has had some similar exertional arm and chest tightness but tends to be mild and she is able to either walk through it or have it resolved within a few minutes.  It does not happen consistently.  She also states that she had COVID-19 in September.  Also under a lot of stress.  She has been tracking her blood pressure at home, in general average systolic has been in the 557D to 140s.  Reports compliance with her medications as outlined below.  She has a history of ENT cancer back in 2013 treated with chemotherapy and radiation.  Also colon cancer subsequently status post sigmoidectomy.   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   Chronic hoarseness    Colon cancer ([place]) 2014   Sigmoidectomy   Dysphagia    Hypertension    Hypertriglyceridemia     Past Surgical History:  Procedure Laterality Date   COLON RESECTION     COLONOSCOPY     04/2020   KNEE SURGERY     oral surgery tooth extraction     TONSILLECTOMY     TOTAL ABDOMINAL HYSTERECTOMY     VULVECTOMY PARTIAL Bilateral  03/30/2018   Procedure: LASER PARTIAL VULVECTOMY FOR VULVUL INTRAEPITHELIA NEOPLASIS III;  Surgeon: Florian Buff, MD;  Location: [facility];  Service: Gynecology;  Laterality: Bilateral;    Current Outpatient Medications  Medication Sig Dispense Refill   ALPRAZolam (XANAX) 0.5 MG tablet Take 1 tablet (0.5 mg total) by mouth 2 (two) times daily. 180 tablet 1   Apple Cid Vn-Grn Tea-Bit Or-Cr (APPLE CIDER VINEGAR PLUS) TABS Take 1 tablet by mouth daily.      fenofibrate 160 MG tablet Take 1 tablet (160 mg total) by mouth daily. 90 tablet 1   HAWTHORNE BERRY PO Take 565 mg by mouth daily.      hydrochlorothiazide (HYDRODIURIL) 25 MG tablet TAKE 1 TABLET (25 MG TOTAL) BY MOUTH DAILY. 30 tablet 0   INOSITOL PO Take 1 capsule by mouth daily.     levothyroxine (SYNTHROID) 75 MCG tablet TAKE 1 TABLET EVERY DAY BEFORE BREAKFAST 90 tablet 1   losartan (COZAAR) 100 MG tablet Take 1 tablet (100 mg total) by mouth daily. 90 tablet 1   Lysine 500 MG CAPS Take 500 mg by mouth 2 (two) times a week.      Milk Thistle 250 MG CAPS Take 250 mg by mouth daily.      Multiple Vitamins-Minerals (CENTRUM SILVER PO) Take by mouth.  OVER THE COUNTER MEDICATION Take 1 capsule by mouth 2 (two) times daily. Liva Plex Supplement     OVER THE COUNTER MEDICATION Take 3 tablets by mouth 3 (three) times daily. Cata Plex Supplement     sodium fluoride (DENTAGEL) 1.1 % GEL dental gel APPLY 1 APPLICATION ONTO TEETH AT BEDTIME. 112 g 5   No current facility-administered medications for this visit.   Allergies:  Azithromycin and Sulfa antibiotics   Social History: The patient  reports that she has been smoking cigarettes. She has a 10.00 pack-year smoking history. She has never used smokeless tobacco. She reports that she does not currently use alcohol. She reports that she does not use drugs.   Family History: The patient's family history includes Cancer in her maternal grandfather; Cancer (age of onset: 51) in her maternal  aunt; Cancer (age of onset: 13) in her mother; Heart attack in her father; Heart failure in her father; Mental illness in her daughter.   ROS: No palpitations or syncope.  Physical Exam: VS:  BP (!) 152/98   Pulse 85   Ht '5\' 3"'$  (1.6 m)   Wt 205 lb 9.6 oz (93.3 kg)   SpO2 97%   BMI 36.42 kg/m , BMI Body mass index is 36.42 kg/m.  Wt Readings from Last 3 Encounters:  03/27/22 205 lb 9.6 oz (93.3 kg)  03/23/22 206 lb (93.4 kg)  01/22/22 200 lb 3.2 oz (90.8 kg)    General: Patient appears comfortable at rest. HEENT: Conjunctiva and lids normal. Neck: Supple, no elevated JVP or carotid bruits. Lungs: Clear to auscultation, nonlabored breathing at rest. Cardiac: Regular rate and rhythm, no S3 or significant systolic murmur, no pericardial rub. Abdomen: Soft, incisional hernia, bowel sounds present, no guarding or rebound. Extremities: No pitting edema. Skin: Warm and dry. Musculoskeletal: No kyphosis. Neuropsychiatric: Alert and oriented x3, affect grossly appropriate.  ECG:  An ECG dated 01/22/2022 was personally reviewed today and demonstrated:  Sinus rhythm.  Recent Labwork: 12/05/2021: Hemoglobin 13.7; Platelets 212 01/08/2022: ALT 24; AST 16; BUN 20; Creatinine, Ser 1.07; Potassium 4.3; Sodium 142; TSH 3.860     Component Value Date/Time   CHOL 209 (H) 12/05/2021 1026   TRIG 171 (H) 12/05/2021 1026   TRIG 344 (H) 05/28/2014 1343   HDL 37 (L) 12/05/2021 1026   HDL 42 05/28/2014 1343   CHOLHDL 5.6 (H) 12/05/2021 1026   LDLCALC 141 (H) 12/05/2021 1026    Other Studies Reviewed Today:  No prior cardiac testing for review today.  Assessment and Plan:  1.  Intermittent exertional arm and chest squeezing in a 66 year old woman with history of hypertension, previous ENT cancer status post chemotherapy and radiation, and mixed hyperlipidemia as noted above.  She is on fenofibrate at this time, also Cozaar and HCTZ.  I reviewed her home blood pressures.  Plan to proceed with an  exercise Myoview for initial ischemic evaluation.  2.  Essential hypertension, now on Cozaar 100 mg daily and HCTZ 25 mg daily.  Does look like blood pressure trend is somewhat better although not optimal as yet.  She has been trying to walk on the treadmill and limit salt.  Medication Adjustments/Labs and Tests Ordered: Current medicines are reviewed at length with the patient today.  Concerns regarding medicines are outlined above.   Tests Ordered: Orders Placed This Encounter  Procedures   NM Myocar Multi W/Spect W/Wall Motion / EF    Medication Changes: No orders of the defined types were placed in this  encounter.   Disposition:  Follow up  test results.  Signed, Satira Sark, MD, [facility] 03/27/2022 2:05 PM    [facility] at [place], [place], [place] [address] Phone: [phone]; Fax: [phone]

## 2022-03-27 NOTE — H&P (View-Only) (Signed)
Cardiology Office Note  Date: 03/27/2022   ID: [name], DOB 02-26-1956, MRN [id number]  PCP:  Chevis Pretty, FNP  Cardiologist:  Rozann Lesches, MD Electrophysiologist:  None   Chief Complaint  Patient presents with   Chest pain    History of Present Illness: [name] is a 66 y.o. female referred for cardiology consultation by Ms. Hassell Done NP for evaluation of chest pain.  I reviewed the available records.  She is here today with her husband for whom she is primary caregiver.  She states that back in April she had an episode of arm squeezing and chest tightness when she was walking uphill.  Had another similar event in May.  In the interim has had trouble controlling her blood pressure, had been on Cozaar 100 mg daily and then HCTZ was added in August.  She has been walking on the treadmill recently for exercise, has had some similar exertional arm and chest tightness but tends to be mild and she is able to either walk through it or have it resolved within a few minutes.  It does not happen consistently.  She also states that she had COVID-19 in September.  Also under a lot of stress.  She has been tracking her blood pressure at home, in general average systolic has been in the 401U to 140s.  Reports compliance with her medications as outlined below.  She has a history of ENT cancer back in 2013 treated with chemotherapy and radiation.  Also colon cancer subsequently status post sigmoidectomy.   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   Chronic hoarseness    Colon cancer ([place]) 2014   Sigmoidectomy   Dysphagia    Hypertension    Hypertriglyceridemia     Past Surgical History:  Procedure Laterality Date   COLON RESECTION     COLONOSCOPY     04/2020   KNEE SURGERY     oral surgery tooth extraction     TONSILLECTOMY     TOTAL ABDOMINAL HYSTERECTOMY     VULVECTOMY PARTIAL Bilateral  03/30/2018   Procedure: LASER PARTIAL VULVECTOMY FOR VULVUL INTRAEPITHELIA NEOPLASIS III;  Surgeon: Florian Buff, MD;  Location: [facility];  Service: Gynecology;  Laterality: Bilateral;    Current Outpatient Medications  Medication Sig Dispense Refill   ALPRAZolam (XANAX) 0.5 MG tablet Take 1 tablet (0.5 mg total) by mouth 2 (two) times daily. 180 tablet 1   Apple Cid Vn-Grn Tea-Bit Or-Cr (APPLE CIDER VINEGAR PLUS) TABS Take 1 tablet by mouth daily.      fenofibrate 160 MG tablet Take 1 tablet (160 mg total) by mouth daily. 90 tablet 1   HAWTHORNE BERRY PO Take 565 mg by mouth daily.      hydrochlorothiazide (HYDRODIURIL) 25 MG tablet TAKE 1 TABLET (25 MG TOTAL) BY MOUTH DAILY. 30 tablet 0   INOSITOL PO Take 1 capsule by mouth daily.     levothyroxine (SYNTHROID) 75 MCG tablet TAKE 1 TABLET EVERY DAY BEFORE BREAKFAST 90 tablet 1   losartan (COZAAR) 100 MG tablet Take 1 tablet (100 mg total) by mouth daily. 90 tablet 1   Lysine 500 MG CAPS Take 500 mg by mouth 2 (two) times a week.      Milk Thistle 250 MG CAPS Take 250 mg by mouth daily.      Multiple Vitamins-Minerals (CENTRUM SILVER PO) Take by mouth.  OVER THE COUNTER MEDICATION Take 1 capsule by mouth 2 (two) times daily. Liva Plex Supplement     OVER THE COUNTER MEDICATION Take 3 tablets by mouth 3 (three) times daily. Cata Plex Supplement     sodium fluoride (DENTAGEL) 1.1 % GEL dental gel APPLY 1 APPLICATION ONTO TEETH AT BEDTIME. 112 g 5   No current facility-administered medications for this visit.   Allergies:  Azithromycin and Sulfa antibiotics   Social History: The patient  reports that she has been smoking cigarettes. She has a 10.00 pack-year smoking history. She has never used smokeless tobacco. She reports that she does not currently use alcohol. She reports that she does not use drugs.   Family History: The patient's family history includes Cancer in her maternal grandfather; Cancer (age of onset: 92) in her maternal  aunt; Cancer (age of onset: 12) in her mother; Heart attack in her father; Heart failure in her father; Mental illness in her daughter.   ROS: No palpitations or syncope.  Physical Exam: VS:  BP (!) 152/98   Pulse 85   Ht '5\' 3"'$  (1.6 m)   Wt 205 lb 9.6 oz (93.3 kg)   SpO2 97%   BMI 36.42 kg/m , BMI Body mass index is 36.42 kg/m.  Wt Readings from Last 3 Encounters:  03/27/22 205 lb 9.6 oz (93.3 kg)  03/23/22 206 lb (93.4 kg)  01/22/22 200 lb 3.2 oz (90.8 kg)    General: Patient appears comfortable at rest. HEENT: Conjunctiva and lids normal. Neck: Supple, no elevated JVP or carotid bruits. Lungs: Clear to auscultation, nonlabored breathing at rest. Cardiac: Regular rate and rhythm, no S3 or significant systolic murmur, no pericardial rub. Abdomen: Soft, incisional hernia, bowel sounds present, no guarding or rebound. Extremities: No pitting edema. Skin: Warm and dry. Musculoskeletal: No kyphosis. Neuropsychiatric: Alert and oriented x3, affect grossly appropriate.  ECG:  An ECG dated 01/22/2022 was personally reviewed today and demonstrated:  Sinus rhythm.  Recent Labwork: 12/05/2021: Hemoglobin 13.7; Platelets 212 01/08/2022: ALT 24; AST 16; BUN 20; Creatinine, Ser 1.07; Potassium 4.3; Sodium 142; TSH 3.860     Component Value Date/Time   CHOL 209 (H) 12/05/2021 1026   TRIG 171 (H) 12/05/2021 1026   TRIG 344 (H) 05/28/2014 1343   HDL 37 (L) 12/05/2021 1026   HDL 42 05/28/2014 1343   CHOLHDL 5.6 (H) 12/05/2021 1026   LDLCALC 141 (H) 12/05/2021 1026    Other Studies Reviewed Today:  No prior cardiac testing for review today.  Assessment and Plan:  1.  Intermittent exertional arm and chest squeezing in a 66 year old woman with history of hypertension, previous ENT cancer status post chemotherapy and radiation, and mixed hyperlipidemia as noted above.  She is on fenofibrate at this time, also Cozaar and HCTZ.  I reviewed her home blood pressures.  Plan to proceed with an  exercise Myoview for initial ischemic evaluation.  2.  Essential hypertension, now on Cozaar 100 mg daily and HCTZ 25 mg daily.  Does look like blood pressure trend is somewhat better although not optimal as yet.  She has been trying to walk on the treadmill and limit salt.  Medication Adjustments/Labs and Tests Ordered: Current medicines are reviewed at length with the patient today.  Concerns regarding medicines are outlined above.   Tests Ordered: Orders Placed This Encounter  Procedures   NM Myocar Multi W/Spect W/Wall Motion / EF    Medication Changes: No orders of the defined types were placed in this  encounter.   Disposition:  Follow up  test results.  Signed, Satira Sark, MD, [facility] 03/27/2022 2:05 PM    [facility] at [place], [place], [place] [address] Phone: [phone]; Fax: [phone]

## 2022-03-31 ENCOUNTER — Encounter (HOSPITAL_COMMUNITY)
Admission: RE | Admit: 2022-03-31 | Discharge: 2022-03-31 | Disposition: A | Discharge status: Home / Self Care | Payer: Medicare HMO | Source: Ambulatory Visit | Attending: Cardiology | Admitting: Cardiology

## 2022-03-31 ENCOUNTER — Ambulatory Visit (HOSPITAL_COMMUNITY)
Admission: RE | Admit: 2022-03-31 | Discharge: 2022-03-31 | Disposition: A | Discharge status: Home / Self Care | Payer: Medicare HMO | Source: Ambulatory Visit | Attending: Cardiology | Admitting: Cardiology

## 2022-03-31 DIAGNOSIS — R079 Chest pain, unspecified: Secondary | ICD-10-CM | POA: Diagnosis not present

## 2022-03-31 LAB — NM MYOCAR MULTI W/SPECT W/WALL MOTION / EF
Angina Index: 0
Duke Treadmill Score: -11
Estimated workload: 7
Exercise duration (min): 4 min
Exercise duration (sec): 1 s
LV dias vol: 78 mL (ref 46–106)
LV sys vol: 40 mL
MPHR: 154 {beats}/min
Nuc Stress EF: 48 %
Peak HR: 151 {beats}/min
Percent HR: 98 %
RATE: 0.3
RPE: 14
Rest HR: 86 {beats}/min
Rest Nuclear Isotope Dose: 11 mCi
SDS: 18
SRS: 6
SSS: 24
ST Depression (mm): 3 mm
Stress Nuclear Isotope Dose: 33 mCi
TID: 1.15

## 2022-03-31 MED ORDER — TECHNETIUM TC 99M TETROFOSMIN IV KIT
10.0000 | PACK | Freq: Once | INTRAVENOUS | Status: AC | PRN
  Administered 2022-03-31: 11 via INTRAVENOUS

## 2022-03-31 MED ORDER — SODIUM CHLORIDE FLUSH 0.9 % IV SOLN
INTRAVENOUS | Status: AC
  Administered 2022-03-31: 10 mL via INTRAVENOUS
  Filled 2022-03-31: qty 10

## 2022-03-31 MED ORDER — REGADENOSON 0.4 MG/5ML IV SOLN
INTRAVENOUS | Status: AC
  Filled 2022-03-31: qty 5

## 2022-03-31 MED ORDER — TECHNETIUM TC 99M TETROFOSMIN IV KIT
30.0000 | PACK | Freq: Once | INTRAVENOUS | Status: AC | PRN
  Administered 2022-03-31: 33 via INTRAVENOUS

## 2022-04-02 ENCOUNTER — Telehealth: Payer: Self-pay | Admitting: *Deleted

## 2022-04-02 NOTE — Telephone Encounter (Signed)
Patient informed and verbalized understanding of plan. Will contact with appointment after discussing with provider. Copy sent to PCP

## 2022-04-02 NOTE — Telephone Encounter (Signed)
-----   Message from Satira Sark, MD sent at 03/31/2022  1:50 PM EDT ----- Results reviewed.  Please let her know that the stress test was significantly abnormal and high risk for the presence of multivessel distribution CAD.  I would suggest a diagnostic cardiac catheterization as a next step.  I am happy to see her as an add-on to clinic in the afternoon tomorrow or Thursday while I am covering [facility].  Would suggest that we get cardiac catheterization set up as soon as possible to determine next step in care.

## 2022-04-03 ENCOUNTER — Ambulatory Visit: Payer: Medicare HMO | Attending: Internal Medicine | Admitting: Internal Medicine

## 2022-04-03 ENCOUNTER — Encounter: Payer: Self-pay | Admitting: Internal Medicine

## 2022-04-03 ENCOUNTER — Telehealth: Payer: Self-pay | Admitting: Internal Medicine

## 2022-04-03 ENCOUNTER — Encounter: Payer: Self-pay | Admitting: *Deleted

## 2022-04-03 VITALS — BP 134/88 | HR 96 | Ht 63.0 in | Wt 206.6 lb

## 2022-04-03 DIAGNOSIS — R079 Chest pain, unspecified: Secondary | ICD-10-CM

## 2022-04-03 DIAGNOSIS — Z01812 Encounter for preprocedural laboratory examination: Secondary | ICD-10-CM | POA: Diagnosis not present

## 2022-04-03 DIAGNOSIS — Z789 Other specified health status: Secondary | ICD-10-CM

## 2022-04-03 DIAGNOSIS — R9439 Abnormal result of other cardiovascular function study: Secondary | ICD-10-CM

## 2022-04-03 MED ORDER — ISOSORBIDE MONONITRATE ER 30 MG PO TB24: 30.0000 mg | ORAL_TABLET | Freq: Every day | ORAL | 6 refills | Status: DC

## 2022-04-03 MED ORDER — EZETIMIBE 10 MG PO TABS: 10.0000 mg | ORAL_TABLET | Freq: Every day | ORAL | 6 refills | Status: DC

## 2022-04-03 MED ORDER — ASPIRIN 81 MG PO TBEC: 81.0000 mg | DELAYED_RELEASE_TABLET | Freq: Every day | ORAL | 6 refills | Status: DC

## 2022-04-03 NOTE — Progress Notes (Signed)
Cardiology Office Note  Date: 04/03/2022   ID: [name], DOB 01/26/1956, MRN [id number]  PCP:  Chevis Pretty, FNP  Cardiologist:  Rozann Lesches, MD Electrophysiologist:  None   Reason for Office Visit: Positive stress test   History of Present Illness: [name] is a 66 y.o. female known to have HTN, HLD presented to cardiology clinic to discuss results of her abnormal stress test report and schedule her for [facility]. She is a patient of Dr. Sherryle Lis.  Patient was scheduled for treadmill nuclear stress test due to cardiac chest pain and risk factors. Stress EKG showed 3 mm of horizontal ST depression in the inferior and inferolateral leads (2, 3, aVF, V5 and V6). ST depression began at minute 3 of stress and ended at minute 5 of recovery. Nuclear scan showed a large, severe intensity, reversible defect from apex to base involving the anterior wall, septum, and inferior wall consistent with multivessel distribution obstructive CAD. Patient is here for follow-up visit. I explained the findings of the abnormal nuclear stress test to the patient and the need to undergo LHC.  Patient is agreeable to get the procedure but she can schedule it only on days when she has someone taking care of her husband for whom she is a primary caretaker. Currently, patient has stable angina 3 times per month with strenuous exertion. Denies DOE, dizziness, lightness, syncope, LE swelling.   Patient stated that she tried atorvastatin and rosuvastatin in the past with muscle aches.  She is interested to try Zetia for HLD.  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   Chronic hoarseness    Colon cancer ([place]) 2014   Sigmoidectomy   Dysphagia    Hypertension    Hypertriglyceridemia     Past Surgical History:  Procedure Laterality Date   COLON RESECTION     COLONOSCOPY     04/2020   KNEE SURGERY     oral surgery tooth  extraction     TONSILLECTOMY     TOTAL ABDOMINAL HYSTERECTOMY     VULVECTOMY PARTIAL Bilateral 03/30/2018   Procedure: LASER PARTIAL VULVECTOMY FOR VULVUL INTRAEPITHELIA NEOPLASIS III;  Surgeon: Florian Buff, MD;  Location: [facility];  Service: Gynecology;  Laterality: Bilateral;    Current Outpatient Medications  Medication Sig Dispense Refill   ALPRAZolam (XANAX) 0.5 MG tablet Take 1 tablet (0.5 mg total) by mouth 2 (two) times daily. 180 tablet 1   Apple Cid Vn-Grn Tea-Bit Or-Cr (APPLE CIDER VINEGAR PLUS) TABS Take 1 tablet by mouth daily.      aspirin EC 81 MG tablet Take 1 tablet (81 mg total) by mouth daily. Swallow whole. 30 tablet 6   ezetimibe (ZETIA) 10 MG tablet Take 1 tablet (10 mg total) by mouth daily. 30 tablet 6   fenofibrate 160 MG tablet Take 1 tablet (160 mg total) by mouth daily. 90 tablet 1   HAWTHORNE BERRY PO Take 565 mg by mouth daily.      hydrochlorothiazide (HYDRODIURIL) 25 MG tablet TAKE 1 TABLET (25 MG TOTAL) BY MOUTH DAILY. 30 tablet 0   INOSITOL PO Take 1 capsule by mouth daily.     isosorbide mononitrate (IMDUR) 30 MG 24 hr tablet Take 1 tablet (30 mg total) by mouth daily. 30 tablet 6   levothyroxine (SYNTHROID) 75 MCG tablet TAKE 1 TABLET EVERY DAY BEFORE BREAKFAST 90 tablet 1  losartan (COZAAR) 100 MG tablet Take 1 tablet (100 mg total) by mouth daily. 90 tablet 1   Lysine 500 MG CAPS Take 500 mg by mouth 2 (two) times a week.      Milk Thistle 250 MG CAPS Take 250 mg by mouth daily.      Multiple Vitamins-Minerals (CENTRUM SILVER PO) Take by mouth.     OVER THE COUNTER MEDICATION Take 1 capsule by mouth 2 (two) times daily. Liva Plex Supplement     OVER THE COUNTER MEDICATION Take 3 tablets by mouth 3 (three) times daily. Cata Plex Supplement     sodium fluoride (DENTAGEL) 1.1 % GEL dental gel APPLY 1 APPLICATION ONTO TEETH AT BEDTIME. 112 g 5   No current facility-administered medications for this visit.   Allergies:  Azithromycin, Lisinopril, and  Sulfa antibiotics   Social History: The patient  reports that she has been smoking cigarettes. She has a 10.00 pack-year smoking history. She has never used smokeless tobacco. She reports that she does not currently use alcohol. She reports that she does not use drugs.   Family History: The patient's family history includes Cancer in her maternal grandfather; Cancer (age of onset: 96) in her maternal aunt; Cancer (age of onset: 51) in her mother; Heart attack in her father; Heart failure in her father; Mental illness in her daughter.   ROS:  Please see the history of present illness. Otherwise, complete review of systems is positive for none.  All other systems are reviewed and negative.   Physical Exam: VS:  BP 134/88   Pulse 96   Ht '5\' 3"'$  (1.6 m)   Wt 206 lb 9.6 oz (93.7 kg)   SpO2 95%   BMI 36.60 kg/m , BMI Body mass index is 36.6 kg/m.  Wt Readings from Last 3 Encounters:  04/03/22 206 lb 9.6 oz (93.7 kg)  03/27/22 205 lb 9.6 oz (93.3 kg)  03/23/22 206 lb (93.4 kg)    General: Patient appears comfortable at rest. HEENT: Conjunctiva and lids normal, oropharynx clear with moist mucosa. Neck: Supple, no elevated JVP or carotid bruits, no thyromegaly. Lungs: Clear to auscultation, nonlabored breathing at rest. Cardiac: Regular rate and rhythm, no S3 or significant systolic murmur, no pericardial rub. Abdomen: Soft, nontender, no hepatomegaly, bowel sounds present, no guarding or rebound. Extremities: No pitting edema, distal pulses 2+. Skin: Warm and dry. Musculoskeletal: No kyphosis. Neuropsychiatric: Alert and oriented x3, affect grossly appropriate.  ECG:  An ECG dated 04/03/2022 was personally reviewed today and demonstrated:  NSR and no ST-T changes  Recent Labwork: 12/05/2021: Hemoglobin 13.7; Platelets 212 01/08/2022: ALT 24; AST 16; BUN 20; Creatinine, Ser 1.07; Potassium 4.3; Sodium 142; TSH 3.860     Component Value Date/Time   CHOL 209 (H) 12/05/2021 1026   TRIG 171  (H) 12/05/2021 1026   TRIG 344 (H) 05/28/2014 1343   HDL 37 (L) 12/05/2021 1026   HDL 42 05/28/2014 1343   CHOLHDL 5.6 (H) 12/05/2021 1026   LDLCALC 141 (H) 12/05/2021 1026    Other Studies Reviewed Today: Treadmill nuclear stress test on 03/31/2022   Findings are consistent with ischemia. The study is high risk.   3.0 mm of horizontal ST depression in the inferior and inferolateral leads (II, III, aVF, V5 and V6) was noted. The ECG was positive for ischemia.  Intermediate risk Duke treadmill score of -11.   LV perfusion is abnormal.  Large, severe intensity, reversible defect from apex to base involving the anterior wall,  septum, and inferior wall consistent with multivessel distribution obstructive CAD.   Left ventricular function is abnormal. Nuclear stress EF: 48 %. The left ventricular ejection fraction is mildly decreased (45-54%). End diastolic cavity size is normal. End systolic cavity size is normal.   Image quality is adequate.   High risk study with large perfusion defects that are reversible within the anterior wall, septum, and inferior wall consistent with multivessel distribution CAD.  LVEF mildly reduced at 48%.  No obvious transient ischemic dilatation noted.  ECG also showed significant ST segment depression consistent with ischemia.  Assessment and Plan: Patient is a 66 year old F known to have HTN presented to the cardiology clinic for discussion of her abnormal stress test.  #High risk exercise Myoview test Plan -Patient had 3 mm horizontal ST depression in the inferior and inferolateral leads that started at minute 3 of stress and resolved at minute 5 of recovery. Nuclear test showed a large reversible defect from apex to base involving the anterior wall, septum, and inferior wall consistent with multivessel CAD. No TID noted. -Patient will benefit from [facility] with possible PCI. Risks and benefits of cardiac catheterization have been discussed with the patient. These  include bleeding, infection, kidney damage, stroke, heart attack, death. The patient understands these risks and is willing to proceed. -Obtain 2D echocardiogram -Start aspirin 81 mg once daily and start Zetia 10 mg once daily -Start Imdur 30 mg once daily for stable angina  #HLD, not at goal (goal less than 70, LDL 141 in 07/23) #Statin intolerance Plan -Patient tried atorvastatin and rosuvastatin in the past with myalgias. She does not want to try any statins at this time and is extremely interested to try Zetia. We will start Zetia 10 mg once daily. She will benefit from initiation of PCSK9 inhibitors however patient hesitant and prefers to try oral medications first unless injectables are the only option. I explained to her that p.o. alternative would be bempedoic acid 180 mg once daily to be taken with Zetia. She would like to call her insurance and research about the costs of both bempedoic acid and PCSK9 inhibitors before trying one of them.  I have spent a total of 48 minutes with patient reviewing char, EKGs, labs and examining patient as well as establishing an assessment and plan that was discussed with the patient.  > 50% of time was spent in direct patient care.       Medication Adjustments/Labs and Tests Ordered: Current medicines are reviewed at length with the patient today.  Concerns regarding medicines are outlined above.   Tests Ordered: Orders Placed This Encounter  Procedures   CBC   Basic metabolic panel   EKG [id number]   ECHOCARDIOGRAM COMPLETE    Medication Changes: Meds ordered this encounter  Medications   isosorbide mononitrate (IMDUR) 30 MG 24 hr tablet    Sig: Take 1 tablet (30 mg total) by mouth daily.    Dispense:  30 tablet    Refill:  6    New 04/03/2022   aspirin EC 81 MG tablet    Sig: Take 1 tablet (81 mg total) by mouth daily. Swallow whole.    Dispense:  30 tablet    Refill:  6    New 04/03/2022   ezetimibe (ZETIA) 10 MG tablet    Sig: Take 1  tablet (10 mg total) by mouth daily.    Dispense:  30 tablet    Refill:  6    New 04/03/2022  Disposition:  Follow up  1 month with Dr Domenic Polite  Signed Dayna Alia Fidel Levy, MD, 04/03/2022 2:01 PM    [facility] at [place], [place], [place] [address]

## 2022-04-03 NOTE — Patient Instructions (Addendum)
Medication Instructions:  Begin Imdur '30mg'$  daily Begin Aspirin '81mg'$  daily  Begin Zetia '10mg'$  daily  Continue all other medications.     Labwork: BMET, CBC - orders given today  Testing/Procedures: Your physician has requested that you have an echocardiogram. Echocardiography is a painless test that uses sound waves to create images of your heart. It provides your doctor with information about the size and shape of your heart and how well your heart's chambers and valves are working. This procedure takes approximately one hour. There are no restrictions for this procedure. Please do NOT wear cologne, perfume, aftershave, or lotions (deodorant is allowed). Please arrive 15 minutes prior to your appointment time. Your physician has requested that you have a cardiac catheterization. Cardiac catheterization is used to diagnose and/or treat various heart conditions. Doctors may recommend this procedure for a number of different reasons. The most common reason is to evaluate chest pain. Chest pain can be a symptom of coronary artery disease (CAD), and cardiac catheterization can show whether plaque is narrowing or blocking your heart's arteries. This procedure is also used to evaluate the valves, as well as measure the blood flow and oxygen levels in different parts of your heart. For further information please visit [email or web address]. Please follow instruction sheet, as given.  Follow-Up: 1 month   Any Other Special Instructions Will Be Listed Below (If Applicable).   If you need a refill on your cardiac medications before your next appointment, please call your pharmacy.

## 2022-04-03 NOTE — Telephone Encounter (Signed)
PERCERT:  left heart cath - Friday, 11/10 - 9:00 am - [facility]

## 2022-04-04 ENCOUNTER — Other Ambulatory Visit: Payer: Self-pay | Admitting: Internal Medicine

## 2022-04-04 MED ORDER — BEMPEDOIC ACID 180 MG PO TABS: 180.0000 mg | ORAL_TABLET | Freq: Every day | ORAL | 3 refills | Status: DC

## 2022-04-07 NOTE — Telephone Encounter (Signed)
Seen by Mallipeddi on 04/03/2022

## 2022-04-08 ENCOUNTER — Ambulatory Visit (INDEPENDENT_AMBULATORY_CARE_PROVIDER_SITE_OTHER): Payer: Medicare HMO

## 2022-04-08 ENCOUNTER — Other Ambulatory Visit (HOSPITAL_COMMUNITY)
Admission: RE | Admit: 2022-04-08 | Discharge: 2022-04-08 | Disposition: A | Discharge status: Home / Self Care | Payer: Medicare HMO | Source: Ambulatory Visit | Attending: Internal Medicine | Admitting: Internal Medicine

## 2022-04-08 DIAGNOSIS — R079 Chest pain, unspecified: Secondary | ICD-10-CM | POA: Insufficient documentation

## 2022-04-08 DIAGNOSIS — R9439 Abnormal result of other cardiovascular function study: Secondary | ICD-10-CM | POA: Insufficient documentation

## 2022-04-08 DIAGNOSIS — Z01812 Encounter for preprocedural laboratory examination: Secondary | ICD-10-CM | POA: Insufficient documentation

## 2022-04-08 LAB — CBC
HCT: 38.3 % (ref 36.0–46.0)
Hemoglobin: 12.8 g/dL (ref 12.0–15.0)
MCH: 30.1 pg (ref 26.0–34.0)
MCHC: 33.4 g/dL (ref 30.0–36.0)
MCV: 90.1 fL (ref 80.0–100.0)
Platelets: 178 10*3/uL (ref 150–400)
RBC: 4.25 MIL/uL (ref 3.87–5.11)
RDW: 13.5 % (ref 11.5–15.5)
WBC: 5.5 10*3/uL (ref 4.0–10.5)
nRBC: 0 % (ref 0.0–0.2)

## 2022-04-08 LAB — BASIC METABOLIC PANEL
Anion gap: 6 (ref 5–15)
BUN: 23 mg/dL (ref 8–23)
CO2: 26 mmol/L (ref 22–32)
Calcium: 9.2 mg/dL (ref 8.9–10.3)
Chloride: 106 mmol/L (ref 98–111)
Creatinine, Ser: 1.18 mg/dL — ABNORMAL HIGH (ref 0.44–1.00)
GFR, Estimated: 51 mL/min — ABNORMAL LOW (ref >60)
Glucose, Bld: 131 mg/dL — ABNORMAL HIGH (ref 70–99)
Potassium: 4.2 mmol/L (ref 3.5–5.1)
Sodium: 138 mmol/L (ref 135–145)

## 2022-04-09 ENCOUNTER — Telehealth: Payer: Self-pay | Admitting: *Deleted

## 2022-04-09 LAB — ECHOCARDIOGRAM COMPLETE
AR max vel: 2.25 cm2
AV Area VTI: 2.29 cm2
AV Area mean vel: 2.15 cm2
AV Mean grad: 3.7 mmHg
AV Peak grad: 6.5 mmHg
Ao pk vel: 1.27 m/s
Area-P 1/2: 3.08 cm2
Calc EF: 60.3 %
MV M vel: 3.85 m/s
MV Peak grad: 59.3 mmHg
S' Lateral: 3.1 cm
Single Plane A2C EF: 60.3 %
Single Plane A4C EF: 58.4 %

## 2022-04-09 NOTE — Telephone Encounter (Signed)
Cardiac Catheterization scheduled at [facility] for: Friday April 10, 2022 9 AM Arrival time and place: [name] Entrance A at: 7 AM  Nothing to eat after midnight prior to procedure, clear liquids until 5 AM day of procedure.  Medication instructions: -Hold:  Losartan/HCTZ-day before and day of procedure-per protocol GFR 51-pt already taken today  Ibuprofen-day before and day of procedure per protocol GRF 51 -Except hold medications usual morning medications can be taken with sips of water including aspirin 81 mg.  Confirmed patient has responsible adult to drive home post procedure and be with patient first 24 hours after arriving home.  Patient reports no new symptoms concerning for COVID-19 in the past 10 days.  Reviewed procedure instructions with patient.

## 2022-04-10 ENCOUNTER — Encounter (HOSPITAL_COMMUNITY): Payer: Self-pay | Admitting: Cardiovascular Disease

## 2022-04-10 ENCOUNTER — Other Ambulatory Visit: Payer: Self-pay

## 2022-04-10 ENCOUNTER — Ambulatory Visit (HOSPITAL_COMMUNITY)
Admission: RE | Admit: 2022-04-10 | Discharge: 2022-04-11 | Disposition: A | Discharge status: Home / Self Care | Payer: Medicare HMO | Source: Ambulatory Visit | Attending: Cardiovascular Disease | Admitting: Cardiovascular Disease

## 2022-04-10 ENCOUNTER — Ambulatory Visit (HOSPITAL_COMMUNITY)
Admission: RE | Disposition: A | Discharge status: Home / Self Care | Payer: Medicare HMO | Source: Ambulatory Visit | Attending: Cardiovascular Disease

## 2022-04-10 DIAGNOSIS — I25119 Atherosclerotic heart disease of native coronary artery with unspecified angina pectoris: Secondary | ICD-10-CM

## 2022-04-10 DIAGNOSIS — I2510 Atherosclerotic heart disease of native coronary artery without angina pectoris: Secondary | ICD-10-CM

## 2022-04-10 DIAGNOSIS — E782 Mixed hyperlipidemia: Secondary | ICD-10-CM | POA: Diagnosis not present

## 2022-04-10 DIAGNOSIS — Z955 Presence of coronary angioplasty implant and graft: Secondary | ICD-10-CM | POA: Diagnosis not present

## 2022-04-10 DIAGNOSIS — I10 Essential (primary) hypertension: Secondary | ICD-10-CM | POA: Diagnosis not present

## 2022-04-10 DIAGNOSIS — Z79899 Other long term (current) drug therapy: Secondary | ICD-10-CM | POA: Insufficient documentation

## 2022-04-10 DIAGNOSIS — I25118 Atherosclerotic heart disease of native coronary artery with other forms of angina pectoris: Secondary | ICD-10-CM | POA: Diagnosis not present

## 2022-04-10 DIAGNOSIS — Z9221 Personal history of antineoplastic chemotherapy: Secondary | ICD-10-CM | POA: Insufficient documentation

## 2022-04-10 DIAGNOSIS — F17210 Nicotine dependence, cigarettes, uncomplicated: Secondary | ICD-10-CM | POA: Diagnosis not present

## 2022-04-10 DIAGNOSIS — F419 Anxiety disorder, unspecified: Secondary | ICD-10-CM | POA: Diagnosis not present

## 2022-04-10 DIAGNOSIS — Z85038 Personal history of other malignant neoplasm of large intestine: Secondary | ICD-10-CM | POA: Insufficient documentation

## 2022-04-10 DIAGNOSIS — R9439 Abnormal result of other cardiovascular function study: Secondary | ICD-10-CM

## 2022-04-10 DIAGNOSIS — Z923 Personal history of irradiation: Secondary | ICD-10-CM | POA: Insufficient documentation

## 2022-04-10 DIAGNOSIS — E039 Hypothyroidism, unspecified: Secondary | ICD-10-CM | POA: Insufficient documentation

## 2022-04-10 DIAGNOSIS — I209 Angina pectoris, unspecified: Secondary | ICD-10-CM

## 2022-04-10 HISTORY — PX: CORONARY STENT INTERVENTION: CATH118234

## 2022-04-10 HISTORY — PX: LEFT HEART CATH AND CORONARY ANGIOGRAPHY: CATH118249

## 2022-04-10 LAB — POCT ACTIVATED CLOTTING TIME
Activated Clotting Time: 263 seconds
Activated Clotting Time: 269 seconds
Activated Clotting Time: 648 seconds

## 2022-04-10 SURGERY — LEFT HEART CATH AND CORONARY ANGIOGRAPHY
Anesthesia: LOCAL

## 2022-04-10 MED ORDER — ISOSORBIDE MONONITRATE ER 30 MG PO TB24
30.0000 mg | ORAL_TABLET | Freq: Every day | ORAL | Status: DC
  Administered 2022-04-10 – 2022-04-11 (×2): 30 mg via ORAL
  Filled 2022-04-10 (×3): qty 1

## 2022-04-10 MED ORDER — ASPIRIN 81 MG PO CHEW: 81.0000 mg | CHEWABLE_TABLET | ORAL | Status: DC

## 2022-04-10 MED ORDER — VERAPAMIL HCL 2.5 MG/ML IV SOLN
INTRAVENOUS | Status: AC
  Filled 2022-04-10: qty 2

## 2022-04-10 MED ORDER — NITROGLYCERIN 1 MG/10 ML FOR IR/CATH LAB
INTRA_ARTERIAL | Status: DC | PRN
  Administered 2022-04-10 (×4): 200 ug via INTRACORONARY

## 2022-04-10 MED ORDER — SODIUM CHLORIDE 0.9 % WEIGHT BASED INFUSION: 1.0000 mL/kg/h | INTRAVENOUS | Status: DC

## 2022-04-10 MED ORDER — LIDOCAINE HCL (PF) 1 % IJ SOLN
INTRAMUSCULAR | Status: DC | PRN
  Administered 2022-04-10: 2 mL

## 2022-04-10 MED ORDER — DIAZEPAM 5 MG PO TABS
5.0000 mg | ORAL_TABLET | Freq: Four times a day (QID) | ORAL | Status: DC | PRN
  Administered 2022-04-10 – 2022-04-11 (×2): 5 mg via ORAL
  Filled 2022-04-10 (×2): qty 1

## 2022-04-10 MED ORDER — IOHEXOL 350 MG/ML SOLN
INTRAVENOUS | Status: DC | PRN
  Administered 2022-04-10: 85 mL

## 2022-04-10 MED ORDER — FENTANYL CITRATE (PF) 100 MCG/2ML IJ SOLN
INTRAMUSCULAR | Status: AC
  Filled 2022-04-10: qty 2

## 2022-04-10 MED ORDER — ROSUVASTATIN CALCIUM 5 MG PO TABS
10.0000 mg | ORAL_TABLET | Freq: Every day | ORAL | Status: DC
  Administered 2022-04-10 – 2022-04-11 (×2): 10 mg via ORAL
  Filled 2022-04-10: qty 2
  Filled 2022-04-10 (×2): qty 1
  Filled 2022-04-10: qty 2

## 2022-04-10 MED ORDER — LIDOCAINE HCL (PF) 1 % IJ SOLN
INTRAMUSCULAR | Status: AC
  Filled 2022-04-10: qty 30

## 2022-04-10 MED ORDER — HEPARIN (PORCINE) IN NACL 1000-0.9 UT/500ML-% IV SOLN
INTRAVENOUS | Status: AC
  Filled 2022-04-10: qty 1000

## 2022-04-10 MED ORDER — SODIUM CHLORIDE 0.9 % IV SOLN: INTRAVENOUS | Status: AC

## 2022-04-10 MED ORDER — VERAPAMIL HCL 2.5 MG/ML IV SOLN
INTRAVENOUS | Status: DC | PRN
  Administered 2022-04-10: 10 mL via INTRA_ARTERIAL

## 2022-04-10 MED ORDER — HYDRALAZINE HCL 20 MG/ML IJ SOLN: 10.0000 mg | INTRAMUSCULAR | Status: AC | PRN

## 2022-04-10 MED ORDER — TICAGRELOR 90 MG PO TABS
90.0000 mg | ORAL_TABLET | Freq: Two times a day (BID) | ORAL | Status: DC
  Administered 2022-04-10 – 2022-04-11 (×2): 90 mg via ORAL
  Filled 2022-04-10 (×2): qty 1

## 2022-04-10 MED ORDER — NITROGLYCERIN 1 MG/10 ML FOR IR/CATH LAB
INTRA_ARTERIAL | Status: AC
  Filled 2022-04-10: qty 10

## 2022-04-10 MED ORDER — ASPIRIN 81 MG PO CHEW
81.0000 mg | CHEWABLE_TABLET | Freq: Every day | ORAL | Status: DC
  Administered 2022-04-11: 81 mg via ORAL
  Filled 2022-04-10: qty 1

## 2022-04-10 MED ORDER — METOPROLOL TARTRATE 12.5 MG HALF TABLET
12.5000 mg | ORAL_TABLET | Freq: Two times a day (BID) | ORAL | Status: DC
  Administered 2022-04-10 – 2022-04-11 (×3): 12.5 mg via ORAL
  Filled 2022-04-10 (×3): qty 1

## 2022-04-10 MED ORDER — TICAGRELOR 90 MG PO TABS
ORAL_TABLET | ORAL | Status: DC | PRN
  Administered 2022-04-10: 180 mg via ORAL

## 2022-04-10 MED ORDER — ONDANSETRON HCL 4 MG/2ML IJ SOLN: 4.0000 mg | Freq: Four times a day (QID) | INTRAMUSCULAR | Status: DC | PRN

## 2022-04-10 MED ORDER — FENTANYL CITRATE (PF) 100 MCG/2ML IJ SOLN
INTRAMUSCULAR | Status: DC | PRN
  Administered 2022-04-10 (×3): 25 ug via INTRAVENOUS

## 2022-04-10 MED ORDER — MIDAZOLAM HCL 2 MG/2ML IJ SOLN
INTRAMUSCULAR | Status: DC | PRN
  Administered 2022-04-10: 2 mg via INTRAVENOUS
  Administered 2022-04-10 (×2): 1 mg via INTRAVENOUS

## 2022-04-10 MED ORDER — MIDAZOLAM HCL 2 MG/2ML IJ SOLN
INTRAMUSCULAR | Status: AC
  Filled 2022-04-10: qty 2

## 2022-04-10 MED ORDER — SODIUM CHLORIDE 0.9% FLUSH: 3.0000 mL | INTRAVENOUS | Status: DC | PRN

## 2022-04-10 MED ORDER — SODIUM CHLORIDE 0.9 % IV SOLN: 250.0000 mL | INTRAVENOUS | Status: DC | PRN

## 2022-04-10 MED ORDER — SODIUM CHLORIDE 0.9 % WEIGHT BASED INFUSION
3.0000 mL/kg/h | INTRAVENOUS | Status: DC
  Administered 2022-04-10: 3 mL/kg/h via INTRAVENOUS

## 2022-04-10 MED ORDER — ACETAMINOPHEN 325 MG PO TABS: 650.0000 mg | ORAL_TABLET | ORAL | Status: DC | PRN

## 2022-04-10 MED ORDER — HEPARIN SODIUM (PORCINE) 1000 UNIT/ML IJ SOLN
INTRAMUSCULAR | Status: AC
  Filled 2022-04-10: qty 10

## 2022-04-10 MED ORDER — LABETALOL HCL 5 MG/ML IV SOLN: 10.0000 mg | INTRAVENOUS | Status: AC | PRN

## 2022-04-10 MED ORDER — HEPARIN SODIUM (PORCINE) 1000 UNIT/ML IJ SOLN
INTRAMUSCULAR | Status: DC | PRN
  Administered 2022-04-10: 4700 [IU] via INTRAVENOUS
  Administered 2022-04-10: 5000 [IU] via INTRAVENOUS
  Administered 2022-04-10: 2000 [IU] via INTRAVENOUS

## 2022-04-10 MED ORDER — SODIUM CHLORIDE 0.9% FLUSH
3.0000 mL | Freq: Two times a day (BID) | INTRAVENOUS | Status: DC
  Administered 2022-04-10: 3 mL via INTRAVENOUS

## 2022-04-10 MED ORDER — HEPARIN (PORCINE) IN NACL 1000-0.9 UT/500ML-% IV SOLN
INTRAVENOUS | Status: DC | PRN
  Administered 2022-04-10 (×2): 500 mL

## 2022-04-10 SURGICAL SUPPLY — 22 items
BALL SAPPHIRE NC24 3.75X10 (BALLOONS) ×1
BALLN EMERGE MR 2.0X12 (BALLOONS) ×1
BALLN SCOREFLEX 2.50X10 (BALLOONS) ×1
BALLOON EMERGE MR 2.0X12 (BALLOONS) IMPLANT
BALLOON SAPPHIRE NC24 3.75X10 (BALLOONS) IMPLANT
BALLOON SCOREFLEX 2.50X10 (BALLOONS) IMPLANT
CATH OPTITORQUE TIG 4.0 5F (CATHETERS) IMPLANT
CATH VISTA GUIDE 6FR XBLAD3.5 (CATHETERS) IMPLANT
DEVICE RAD COMP TR BAND LRG (VASCULAR PRODUCTS) IMPLANT
ELECT DEFIB PAD ADLT CADENCE (PAD) IMPLANT
GLIDESHEATH SLEND SS 6F .021 (SHEATH) IMPLANT
GUIDEWIRE INQWIRE 1.5J.035X260 (WIRE) IMPLANT
INQWIRE 1.5J .035X260CM (WIRE) ×1
KIT ENCORE 26 ADVANTAGE (KITS) IMPLANT
KIT HEART LEFT (KITS) ×1 IMPLANT
PACK CARDIAC CATHETERIZATION (CUSTOM PROCEDURE TRAY) ×1 IMPLANT
SHEATH PROBE COVER 6X72 (BAG) IMPLANT
STENT ONYX FRONTIER 3.5X15 (Permanent Stent) IMPLANT
SYR MEDRAD MARK 7 150ML (SYRINGE) ×1 IMPLANT
TRANSDUCER W/STOPCOCK (MISCELLANEOUS) ×1 IMPLANT
TUBING CIL FLEX 10 FLL-RA (TUBING) ×1 IMPLANT
WIRE COUGAR XT STRL 190CM (WIRE) IMPLANT

## 2022-04-10 NOTE — Interval H&P Note (Signed)
Cath Lab Visit (complete for each Cath Lab visit)  Clinical Evaluation Leading to the Procedure:   ACS: No.  Non-ACS:    Anginal Classification: CCS III  Anti-ischemic medical therapy: Minimal Therapy (1 class of medications)  Non-Invasive Test Results: High-risk stress test findings: cardiac mortality >3%/year  Prior CABG: No previous CABG      History and Physical Interval Note:  04/10/2022 7:41 AM  [name]  has presented today for surgery, with the diagnosis of abnormal stress test.  The various methods of treatment have been discussed with the patient and family. After consideration of risks, benefits and other options for treatment, the patient has consented to  Procedure(s): LEFT HEART CATH AND CORONARY ANGIOGRAPHY (N/A) as a surgical intervention.  The patient's history has been reviewed, patient examined, no change in status, stable for surgery.  I have reviewed the patient's chart and labs.  Questions were answered to the patient's satisfaction.     Shelva Majestic

## 2022-04-11 DIAGNOSIS — E782 Mixed hyperlipidemia: Secondary | ICD-10-CM | POA: Diagnosis not present

## 2022-04-11 DIAGNOSIS — Z9861 Coronary angioplasty status: Secondary | ICD-10-CM

## 2022-04-11 DIAGNOSIS — Z9221 Personal history of antineoplastic chemotherapy: Secondary | ICD-10-CM | POA: Diagnosis not present

## 2022-04-11 DIAGNOSIS — I2510 Atherosclerotic heart disease of native coronary artery without angina pectoris: Secondary | ICD-10-CM

## 2022-04-11 DIAGNOSIS — Z79899 Other long term (current) drug therapy: Secondary | ICD-10-CM | POA: Diagnosis not present

## 2022-04-11 DIAGNOSIS — F419 Anxiety disorder, unspecified: Secondary | ICD-10-CM | POA: Diagnosis not present

## 2022-04-11 DIAGNOSIS — Z955 Presence of coronary angioplasty implant and graft: Secondary | ICD-10-CM | POA: Diagnosis not present

## 2022-04-11 DIAGNOSIS — Z923 Personal history of irradiation: Secondary | ICD-10-CM | POA: Diagnosis not present

## 2022-04-11 DIAGNOSIS — I25118 Atherosclerotic heart disease of native coronary artery with other forms of angina pectoris: Secondary | ICD-10-CM | POA: Diagnosis not present

## 2022-04-11 DIAGNOSIS — F17210 Nicotine dependence, cigarettes, uncomplicated: Secondary | ICD-10-CM | POA: Diagnosis not present

## 2022-04-11 DIAGNOSIS — I10 Essential (primary) hypertension: Secondary | ICD-10-CM | POA: Diagnosis not present

## 2022-04-11 LAB — BASIC METABOLIC PANEL
Anion gap: 7 (ref 5–15)
BUN: 26 mg/dL — ABNORMAL HIGH (ref 8–23)
CO2: 22 mmol/L (ref 22–32)
Calcium: 9.3 mg/dL (ref 8.9–10.3)
Chloride: 108 mmol/L (ref 98–111)
Creatinine, Ser: 1.44 mg/dL — ABNORMAL HIGH (ref 0.44–1.00)
GFR, Estimated: 40 mL/min — ABNORMAL LOW (ref >60)
Glucose, Bld: 108 mg/dL — ABNORMAL HIGH (ref 70–99)
Potassium: 3.7 mmol/L (ref 3.5–5.1)
Sodium: 137 mmol/L (ref 135–145)

## 2022-04-11 LAB — CBC
HCT: 38 % (ref 36.0–46.0)
Hemoglobin: 12.6 g/dL (ref 12.0–15.0)
MCH: 30.1 pg (ref 26.0–34.0)
MCHC: 33.2 g/dL (ref 30.0–36.0)
MCV: 90.7 fL (ref 80.0–100.0)
Platelets: 230 10*3/uL (ref 150–400)
RBC: 4.19 MIL/uL (ref 3.87–5.11)
RDW: 13.4 % (ref 11.5–15.5)
WBC: 7.8 10*3/uL (ref 4.0–10.5)
nRBC: 0 % (ref 0.0–0.2)

## 2022-04-11 MED ORDER — NITROGLYCERIN 0.4 MG SL SUBL: 0.4000 mg | SUBLINGUAL_TABLET | SUBLINGUAL | Status: DC | PRN

## 2022-04-11 MED ORDER — METOPROLOL TARTRATE 25 MG PO TABS: 12.5000 mg | ORAL_TABLET | Freq: Two times a day (BID) | ORAL | 3 refills | Status: DC

## 2022-04-11 MED ORDER — TICAGRELOR 90 MG PO TABS: 90.0000 mg | ORAL_TABLET | Freq: Two times a day (BID) | ORAL | 3 refills | Status: DC

## 2022-04-11 MED ORDER — ROSUVASTATIN CALCIUM 10 MG PO TABS: 10.0000 mg | ORAL_TABLET | Freq: Every day | ORAL | 2 refills | Status: DC

## 2022-04-11 MED ORDER — SODIUM CHLORIDE 0.9 % IV SOLN: INTRAVENOUS | Status: DC

## 2022-04-11 MED ORDER — NITROGLYCERIN 0.4 MG SL SUBL: 0.4000 mg | SUBLINGUAL_TABLET | SUBLINGUAL | 2 refills | Status: DC | PRN

## 2022-04-11 NOTE — Progress Notes (Signed)
Explained discharge instructions to patient. Reviewed follow up appointment and next medication administration times. Also reviewed education. Retrieved patient's home meds from pharmacy and delivered to the patient. Patient verbalized having an understanding for instructions given. All belongings are in the patient's possession to include her home meds. IV and telemetry were removed. CCMD was notified. No other needs verbalized. Transported downstairs for discharge.

## 2022-04-11 NOTE — Progress Notes (Signed)
Rounding Note    Patient Name: [name] Date of Encounter: 04/11/2022  [place] Cardiologist: Rozann Lesches, MD    Subjective   66 year old female with history of chest pain. Echocardiogram on November 8 reveals normal left ventricular systolic function.  Heart catheterization on November 10 revealed proximal to mid LAD stenosis of 95%.  She has a proximal RCA stenosis 100%.  She had a successful PCI of subtotal proximal LAD stenosis.  The right coronary artery will be treated medically.  She has extensive collateral left to right filling of her right coronary artery.  She is on aspirin 81 mg a day, Brilinta 90 mg a day.    Inpatient Medications    Scheduled Meds:  aspirin  81 mg Oral Daily   isosorbide mononitrate  30 mg Oral Daily   metoprolol tartrate  12.5 mg Oral BID   rosuvastatin  10 mg Oral Daily   sodium chloride flush  3 mL Intravenous Q12H   ticagrelor  90 mg Oral BID   Continuous Infusions:  sodium chloride     PRN Meds: sodium chloride, acetaminophen, diazepam, ondansetron (ZOFRAN) IV, sodium chloride flush   Vital Signs    Vitals:   04/10/22 1558 04/10/22 2024 04/10/22 2300 04/11/22 0410  BP: 125/75 121/70 (!) 127/96 131/82  Pulse: 63 67 61 62  Resp: '16 16 16 16  '$ Temp: 98 F (36.7 C) 97.9 F (36.6 C) 97.7 F (36.5 C) 97.6 F (36.4 C)  TempSrc: Oral Oral Oral Oral  SpO2: 97% 97%  98%  Weight:      Height:        Intake/Output Summary (Last 24 hours) at 04/11/2022 0635 Last data filed at 04/10/2022 2145 Gross per 24 hour  Intake 1422.45 ml  Output --  Net 1422.45 ml      04/10/2022    5:43 AM 04/03/2022   11:10 AM 03/27/2022    1:18 PM  Last 3 Weights  Weight (lbs) 206 lb 206 lb 9.6 oz 205 lb 9.6 oz  Weight (kg) 93.441 kg 93.713 kg 93.26 kg      Telemetry    NSR  - Personally Reviewed  ECG     - Personally Reviewed  Physical Exam   GEN: No acute distress.   Neck: No JVD Cardiac: RRR, no murmurs,  rubs, or gallops.  Respiratory: Clear to auscultation bilaterally. GI: Soft, nontender, non-distended  MS: No edema; ,  R radial , slight hematoma,  mildly tender  Neuro:  Nonfocal  Psych: Normal affect   Labs    High Sensitivity Troponin:  No results for input(s): "TROPONINIHS" in the last 720 hours.   Chemistry Recent Labs  Lab 04/08/22 0832 04/11/22 0104  NA 138 137  K 4.2 3.7  CL 106 108  CO2 26 22  GLUCOSE 131* 108*  BUN 23 26*  CREATININE 1.18* 1.44*  CALCIUM 9.2 9.3  GFRNONAA 51* 40*  ANIONGAP 6 7    Lipids No results for input(s): "CHOL", "TRIG", "HDL", "LABVLDL", "LDLCALC", "CHOLHDL" in the last 168 hours.  Hematology Recent Labs  Lab 04/08/22 0832 04/11/22 0104  WBC 5.5 7.8  RBC 4.25 4.19  HGB 12.8 12.6  HCT 38.3 38.0  MCV 90.1 90.7  MCH 30.1 30.1  MCHC 33.4 33.2  RDW 13.5 13.4  PLT 178 230   Thyroid No results for input(s): "TSH", "FREET4" in the last 168 hours.  BNPNo results for input(s): "BNP", "PROBNP" in the last 168 hours.  DDimer  No results for input(s): "DDIMER" in the last 168 hours.   Radiology    CARDIAC CATHETERIZATION  Result Date: 04/10/2022   1st Mrg lesion is 50% stenosed.   Prox LAD to Mid LAD lesion is 95% stenosed.   Prox RCA to Mid RCA lesion is 100% stenosed.   RPDA lesion is 70% stenosed.   Mid LAD lesion is 30% stenosed.   Mid Cx lesion is 30% stenosed.   Prox LAD lesion is 30% stenosed.   A drug-eluting stent was successfully placed.   Post intervention, there is a 0% residual stenosis.   Post intervention, there is a 0% residual stenosis. Multivessel CAD with 95 to 99% proximal LAD stenosis with mild calcification superiorly and 30% mid stenosis; 50% ostial stenosis in a small high marginal vessel of the circumflex with 30% mid AV groove stenosis; and superior takeoff RCA with 70% proximal stenosis followed by total occlusion after the RV marginal branch.  There is extensive collateralization to the distal PDA and PLA vessel from  the left coronary injection with visualization of 70% ostial PDA stenosis. LVEDP 17 mmHg. Successful percutaneous coronary intervention to the subtotal proximal LAD stenosis treated with initial PTCA using a 2.5 x 12 mm balloon, due to the calcification superiorly a 2.5 x 10 mm core flex balloon, and ultimate insertion of a 3.5 x 15 mm Medtronic Onyx Frontier stent postdilated to 3.71 mm the stenosis being reduced to 0%. RECOMMENDATION: DAPT with aspirin/Brilinta for minimum of 12 months.  With total RCA occlusion with collateralization via the left system and mild to moderate left circumflex stenoses, plan initial medical therapy for concomitant CAD.  Will initiate beta-blocker therapy, and continue nitrates.  Depending upon blood pressure amlodipine can be added in the future.  The patient apparently did not tolerate predominantly atorvastaton; will try rechallenge statin therapy with low-dose rosuvastatin.  The patient had recently been started on Zetia and is awaiting to start bempedoic acid pending insurance approval.  May need future  PCSK9 inhibition.  We will plan to keep in hospital overnight with medication initiation and with her home 45 minutes away with plans for discharge tomorrow.   Cardiac Studies      Patient Profile     66 y.o. female    [place]    1.  Coronary artery disease: She is status post PCI of her proximal LAD.  She has an occluded mid right coronary stenosis with good left-to-right collateral filling.  We will treat the RCA medically.  She will be discharged on Brilinta 90 mg twice daily, aspirin 81 mg a day   Isosorbide 30 mg a day, metoprolol 4.5 mg twice daily Rosuvastatin 10 mg a day Nitroglycerin sublingually.  She will need a follow-up appointment with her primary cardiologist or APP in several weeks.  2.  Hyperlipidemia: She is currently on rosuvastatin.  She is also been prescribed bempedoic acid.  She will follow-up with her primary cardiologist  regarding whether or not she should take the bempedoic acid.  It was very expensive and she is not sure that she will actually receive it.    For questions or updates, please contact [place] Please consult [email or web address] for contact info under        Signed, Mertie Moores, MD  04/11/2022, 6:35 AM

## 2022-04-11 NOTE — Discharge Summary (Cosign Needed Addendum)
Discharge Summary    Patient ID: [name] MRN: [id number]; DOB: [birth year]  Admit date: 04/10/2022 Discharge date: 04/11/2022  PCP:  Chevis Pretty, [place] Providers Cardiologist:  Rozann Lesches, MD   {    Discharge Diagnoses    Principal Problem:   CAD S/P percutaneous coronary angioplasty Active Problems:   Angina pectoris ([place])   Abnormal nuclear stress test    Diagnostic Studies/Procedures    LHC on 04/10/22 showed:  Fluoro time: 14.2 (min) DAP: 34.9 (Gycm2) Cumulative Air Kerma: 758.8 (mGy)    1st Mrg lesion is 50% stenosed.   Prox LAD to Mid LAD lesion is 95% stenosed.   Prox RCA to Mid RCA lesion is 100% stenosed.   RPDA lesion is 70% stenosed.   Mid LAD lesion is 30% stenosed.   Mid Cx lesion is 30% stenosed.   Prox LAD lesion is 30% stenosed.   A drug-eluting stent was successfully placed.   Post intervention, there is a 0% residual stenosis.   Post intervention, there is a 0% residual stenosis.   Multivessel CAD with 95 to 99% proximal LAD stenosis with mild calcification superiorly and 30% mid stenosis; 50% ostial stenosis in a small high marginal vessel of the circumflex with 30% mid AV groove stenosis; and superior takeoff RCA with 70% proximal stenosis followed by total occlusion after the RV marginal branch.  There is extensive collateralization to the distal PDA and PLA vessel from the left coronary injection with visualization of 70% ostial PDA stenosis.   LVEDP 17 mmHg.   Successful percutaneous coronary intervention to the subtotal proximal LAD stenosis treated with initial PTCA using a 2.5 x 12 mm balloon, due to the calcification superiorly a 2.5 x 10 mm core flex balloon, and ultimate insertion of a 3.5 x 15 mm Medtronic Onyx Frontier stent postdilated to 3.71 mm the stenosis being reduced to 0%.   RECOMMENDATION:  DAPT with aspirin/Brilinta for minimum of 12 months.  With total RCA occlusion with  collateralization via the left system and mild to moderate left circumflex stenoses, plan initial medical therapy for concomitant CAD.  Will initiate beta-blocker therapy, and continue nitrates.  Depending upon blood pressure amlodipine can be added in the future.  The patient apparently did not tolerate predominantly atorvastaton; will try rechallenge statin therapy with low-dose rosuvastatin.  The patient had recently been started on Zetia and is awaiting to start bempedoic acid pending insurance approval.  May need future  PCSK9 inhibition.  We will plan to keep in hospital overnight with medication initiation and with her home 45 minutes away with plans for discharge tomorrow.   Intervention   Echo on 04/08/22:   1. Left ventricular ejection fraction, by estimation, is 60 to 65%. The  left ventricle has normal function. The left ventricle has no regional  wall motion abnormalities. Left ventricular diastolic parameters are  consistent with Grade I diastolic  dysfunction (impaired relaxation).   2. Right ventricular systolic function is normal. The right ventricular  size is normal.   3. The mitral valve is normal in structure. No evidence of mitral valve  regurgitation. No evidence of mitral stenosis.   4. The aortic valve is tricuspid. Aortic valve regurgitation is not  visualized. No aortic stenosis is present.   5. The inferior vena cava is normal in size with greater than 50%  respiratory variability, suggesting right atrial pressure of 3 mmHg.   Comparison(s): Nuclear stress test done 03/31/22 showed an  EF of 48%.      History of Present Illness     Per admission H&P on 04/03/22:  [name] is a 66 y.o. female known to have HTN, HLD, who presented to cardiology clinic on 04/03/22 to discuss results of her abnormal stress test report and schedule her for [facility] She is a patient of Dr. Sherryle Lis.   Patient was scheduled for treadmill nuclear stress test due to cardiac chest pain  and risk factors. Stress EKG showed 3 mm of horizontal ST depression in the inferior and inferolateral leads (2, 3, aVF, V5 and V6). ST depression began at minute 3 of stress and ended at minute 5 of recovery. Nuclear scan showed a large, severe intensity, reversible defect from apex to base involving the anterior wall, septum, and inferior wall consistent with multivessel distribution obstructive CAD. Patient saw Dr Dellia Cloud for follow-up visit. She had explained the findings of the abnormal nuclear stress test to the patient and the need to undergo LHC.  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 Currently, patient has stable angina 3 times per month with strenuous exertion. Denies DOE, dizziness, lightness, syncope, LE swelling.    Patient stated that she tried atorvastatin and rosuvastatin in the past with muscle aches.  She wa interested to try Zetia for HLD.  She was arranged LHC on 04/10/22.    Hospital Course     Consultants: N/A   Chest pain CAD - arranged for outpatient LHC on 04/10/22 due to chest pain and abnormal stress test - Echo with LVEF 60-65%, grade I DD -  LHC on 04/10/22 : Multivessel CAD with 95 to 99% proximal LAD stenosis with mild calcification superiorly and 30% mid stenosis; 50% ostial stenosis in a small high marginal vessel of the circumflex with 30% mid AV groove stenosis; and superior takeoff RCA with 70% proximal stenosis followed by total occlusion after the RV marginal branch.  There is extensive collateralization to the distal PDA and PLA vessel. Successful PCI to the subtotal proximal LAD stenosis treated with initial PTCA  - Medical therapy: start Aspirin and Brilinta for 12 month; start metoprolol 12.35m BID; continue imdur 359mdaily; re-challenge with Crestor 107mcontinue Zetia and fenofibrate; and PRN nitro ; all new script sent to [facility] today/confirmed they have Brilinta supply  - Renal index slightly up today with Cr from 1.18 to 1.44, baseline seems around 1.1, will start IVF at 100m67m today, continue until discharge; stop HCTZ; consider repeat BMP outpatient at follow up appointment  - Post cath care discussed - Follow up with Dr McDoDomenic Politeanged on 05/19/22, message sent to office to request sooner appointment in 2 weeks for post PCI check up    HTN - BP controlled, will stop HCTZ, continue PTA losartan and Imdur, started metoprolol this admission, follow up outpatient   HLD - on PTA zetia and fenofibrate, added low dose Crestor 10mg51mly, follow up outpatient   Anxiety Hypothyroidism - no change of her chronic meds     Did the patient have an acute coronary syndrome (MI, NSTEMI, STEMI, etc) this admission?:  No                               Did the patient have a percutaneous coronary intervention (stent / angioplasty)?:  Yes.     Cath/PCI Registry Performance & Quality Measures: Aspirin prescribed? - Yes ADP Receptor Inhibitor (Plavix/Clopidogrel, Brilinta/Ticagrelor or Effient/Prasugrel) prescribed (includes medically managed patients)? - Yes High Intensity Statin (Lipitor 40-13m or Crestor 20-459m prescribed? - No - intolerance For EF <40%, was ACEI/ARB prescribed? - Yes For EF <40%, Aldosterone Antagonist (Spironolactone or Eplerenone) prescribed? - Not Applicable (EF >/= 4009%Cardiac Rehab Phase II ordered? - Yes       The patient will be scheduled for a TOC follow up appointment in 14 days.  [facility] at the office where the patient should be seen for follow up.  _____________  Discharge Vitals Blood pressure 131/82, pulse 62, temperature 97.6 F (36.4 C), temperature source Oral, resp. rate 16, height _0  (1.6 m), weight 93.4 kg, SpO2 98 %.  Filed Weights   04/10/22 0543  Weight: 93.4 kg   See attending rounding note for physical  exam   Labs & Radiologic Studies    CBC Recent Labs    04/11/22 0104  WBC 7.8  HGB 12.6  HCT 38.0  MCV 90.7  PLT 23323 Basic Metabolic Panel Recent Labs    04/11/22 0104  NA 137  K 3.7  CL 108  CO2 22  GLUCOSE 108*  BUN 26*  CREATININE 1.44*  CALCIUM 9.3   Liver Function Tests No results for input(s): "AST", "ALT", "ALKPHOS", "BILITOT", "PROT", "ALBUMIN" in the last 72 hours. No results for input(s): "LIPASE", "AMYLASE" in the last 72 hours. High Sensitivity Troponin:   No results for input(s): "TROPONINIHS" in the last 720 hours.  BNP Invalid input(s): "POCBNP" D-Dimer No results for input(s): "DDIMER" in the last 72 hours. Hemoglobin A1C No results for input(s): "HGBA1C" in the last 72 hours. Fasting Lipid Panel No results for input(s): "CHOL", "HDL", "LDLCALC", "TRIG", "CHOLHDL", "LDLDIRECT" in the last 72 hours. Thyroid Function Tests No results for input(s): "TSH", "T4TOTAL", "T3FREE", "THYROIDAB" in the last 72 hours.  Invalid input(s): "FREET3" _____________  CARDIAC CATHETERIZATION  Result Date: 04/10/2022   1st Mrg lesion is 50% stenosed.   Prox LAD to Mid LAD lesion is 95% stenosed.   Prox RCA to Mid RCA lesion is 100% stenosed.   RPDA lesion is 70% stenosed.   Mid LAD lesion is 30% stenosed.   Mid Cx lesion is 30% stenosed.   Prox LAD lesion is 30% stenosed.   A drug-eluting stent was successfully placed.   Post intervention, there is a 0% residual stenosis.   Post intervention, there is a 0% residual stenosis. Multivessel CAD with 95 to 99% proximal LAD stenosis with mild calcification superiorly and 30% mid stenosis; 50% ostial stenosis in a small high marginal vessel of the circumflex with 30% mid AV groove stenosis; and superior takeoff RCA with 70% proximal stenosis followed by total occlusion after the RV marginal branch.  There is extensive collateralization to the distal PDA and PLA vessel from the left coronary injection with visualization of 70%  ostial PDA stenosis. LVEDP 17 mmHg. Successful percutaneous coronary intervention to the subtotal proximal LAD stenosis treated with initial PTCA using a 2.5 x 12 mm balloon, due to the calcification superiorly a 2.5 x 10 mm core flex balloon, and ultimate insertion of a 3.5 x 15 mm Medtronic Onyx Frontier stent postdilated to 3.71 mm the stenosis being reduced to 0%. RECOMMENDATION: DAPT with aspirin/Brilinta for minimum of 12 months.  With total RCA occlusion with collateralization  via the left system and mild to moderate left circumflex stenoses, plan initial medical therapy for concomitant CAD.  Will initiate beta-blocker therapy, and continue nitrates.  Depending upon blood pressure amlodipine can be added in the future.  The patient apparently did not tolerate predominantly atorvastaton; will try rechallenge statin therapy with low-dose rosuvastatin.  The patient had recently been started on Zetia and is awaiting to start bempedoic acid pending insurance approval.  May need future  PCSK9 inhibition.  We will plan to keep in hospital overnight with medication initiation and with her home 45 minutes away with plans for discharge tomorrow.  ECHOCARDIOGRAM COMPLETE  Result Date: 04/09/2022    ECHOCARDIOGRAM REPORT   Patient Name:   [name] Date of Exam: 04/08/2022 Medical Rec #:  [id number]     Height:       63.0 in Accession #:    [id number]    Weight:       206.6 lb Date of Birth:  1955-07-27     BSA:          1.961 m Patient Age:    51 years      BP:           134/88 mmHg Patient Gender: F             HR:           76 bpm. Exam Location:  [place] Procedure: 2D Echo, Cardiac Doppler, Color Doppler and Strain Analysis Indications:    Dx: Abnormal stress test [R94.39 (ICD-10-CM)  History:        Patient has no prior history of Echocardiogram examinations.                 Abnormal stress test, Signs/Symptoms:Shortness of Breath and                 Chest Pain; Risk Factors:Current Smoker, Hypertension, Diabetes                  and Dyslipidemia.  Sonographer:    Jeneen Montgomery RDMS, RVT, RDCS Referring Phys: [id number] VISHNU P MALLIPEDDI IMPRESSIONS  1. Left ventricular ejection fraction, by estimation, is 60 to 65%. The left ventricle has normal function. The left ventricle has no regional wall motion abnormalities. Left ventricular diastolic parameters are consistent with Grade I diastolic dysfunction (impaired relaxation).  2. Right ventricular systolic function is normal. The right ventricular size is normal.  3. The mitral valve is normal in structure. No evidence of mitral valve regurgitation. No evidence of mitral stenosis.  4. The aortic valve is tricuspid. Aortic valve regurgitation is not visualized. No aortic stenosis is present.  5. The inferior vena cava is normal in size with greater than 50% respiratory variability, suggesting right atrial pressure of 3 mmHg. Comparison(s): Nuclear stress test done 03/31/22 showed an EF of 48%. FINDINGS  Left Ventricle: Left ventricular ejection fraction, by estimation, is 60 to 65%. The left ventricle has normal function. The left ventricle has no regional wall motion abnormalities. The left ventricular internal cavity size was normal in size. There is  no left ventricular hypertrophy. Left ventricular diastolic parameters are consistent with Grade I diastolic dysfunction (impaired relaxation). Normal left ventricular filling pressure. Right Ventricle: The right ventricular size is normal. Right vetricular wall thickness was not well visualized. Right ventricular systolic function is normal. Left Atrium: Left atrial size was normal in size. Right Atrium: Right atrial size was normal in size. Pericardium: There is no evidence of pericardial  effusion. Mitral Valve: The mitral valve is normal in structure. No evidence of mitral valve regurgitation. No evidence of mitral valve stenosis. Tricuspid Valve: The tricuspid valve is normal in structure. Tricuspid valve regurgitation is  mild . No evidence of tricuspid stenosis. Aortic Valve: The aortic valve is tricuspid. Aortic valve regurgitation is not visualized. No aortic stenosis is present. Aortic valve mean gradient measures 3.7 mmHg. Aortic valve peak gradient measures 6.5 mmHg. Aortic valve area, by VTI measures 2.29 cm. Pulmonic Valve: The pulmonic valve was not well visualized. Pulmonic valve regurgitation is not visualized. No evidence of pulmonic stenosis. Aorta: The aortic root is normal in size and structure. Venous: The inferior vena cava is normal in size with greater than 50% respiratory variability, suggesting right atrial pressure of 3 mmHg. IAS/Shunts: No atrial level shunt detected by color flow Doppler.  LEFT VENTRICLE PLAX 2D LVIDd:         4.40 cm     Diastology LVIDs:         3.10 cm     LV e' medial:    6.53 cm/s LV PW:         0.90 cm     LV E/e' medial:  9.9 LV IVS:        1.00 cm     LV e' lateral:   7.62 cm/s LVOT diam:     1.90 cm     LV E/e' lateral: 8.5 LV SV:         58 LV SV Index:   30          2D Longitudinal Strain LVOT Area:     2.84 cm    2D Strain GLS (A2C):   -18.2 %                            2D Strain GLS (A3C):   -20.5 %                            2D Strain GLS (A4C):   -19.8 % LV Volumes (MOD)           2D Strain GLS Avg:     -19.5 % LV vol d, MOD A2C: 73.5 ml LV vol d, MOD A4C: 64.0 ml LV vol s, MOD A2C: 29.2 ml LV vol s, MOD A4C: 26.6 ml LV SV MOD A2C:     44.3 ml LV SV MOD A4C:     64.0 ml LV SV MOD BP:      43.3 ml RIGHT VENTRICLE RV S prime:     13.20 cm/s TAPSE (M-mode): 2.5 cm LEFT ATRIUM             Index        RIGHT ATRIUM           Index LA diam:        3.00 cm 1.53 cm/m   RA Area:     11.70 cm LA Vol (A2C):   39.4 ml 20.10 ml/m  RA Volume:   27.40 ml  13.98 ml/m LA Vol (A4C):   37.6 ml 19.18 ml/m LA Biplane Vol: 40.4 ml 20.61 ml/m  AORTIC VALVE AV Area (Vmax):    2.25 cm AV Area (Vmean):   2.15 cm AV Area (VTI):     2.29 cm AV Vmax:           127.32 cm/s AV Vmean:  89.423 cm/s AV VTI:            0.255 m AV Peak Grad:      6.5 mmHg AV Mean Grad:      3.7 mmHg LVOT Vmax:         101.00 cm/s LVOT Vmean:        67.800 cm/s LVOT VTI:          0.206 m LVOT/AV VTI ratio: 0.81  AORTA Ao Root diam: 3.10 cm MITRAL VALVE               TRICUSPID VALVE MV Area (PHT): 3.08 cm    TR Peak grad:   21.2 mmHg MV Decel Time: 246 msec    TR Vmax:        230.00 cm/s MR Peak grad: 59.3 mmHg MR Vmax:      385.00 cm/s  SHUNTS MV E velocity: 64.70 cm/s  Systemic VTI:  0.21 m MV A velocity: 75.40 cm/s  Systemic Diam: 1.90 cm MV E/A ratio:  0.86 Carlyle Dolly MD Electronically signed by Carlyle Dolly MD Signature Date/Time: 04/09/2022/7:08:41 PM    Final    NM Myocar Multi W/Spect Tamela Oddi Motion / EF  Result Date: 03/31/2022   Findings are consistent with ischemia. The study is high risk.   3.0 mm of horizontal ST depression in the inferior and inferolateral leads (II, III, aVF, V5 and V6) was noted. The ECG was positive for ischemia.  Intermediate risk Duke treadmill score of -11.   LV perfusion is abnormal.  Large, severe intensity, reversible defect from apex to base involving the anterior wall, septum, and inferior wall consistent with multivessel distribution obstructive CAD.   Left ventricular function is abnormal. Nuclear stress EF: 48 %. The left ventricular ejection fraction is mildly decreased (45-54%). End diastolic cavity size is normal. End systolic cavity size is normal.   Image quality is adequate. High risk study with large perfusion defects that are reversible within the anterior wall, septum, and inferior wall consistent with multivessel distribution CAD.  LVEF mildly reduced at 48%.  No obvious transient ischemic dilatation noted.  ECG also showed significant ST segment depression consistent with ischemia.   Disposition   Patient is seen by Dr Acie Fredrickson today and deemed  stable for discharge. Patient is agreeable with medication change and follow up, discharge instruction  reviewed.  Pt is being discharged home today in good condition.  Follow-up Plans & Appointments     Follow-up Information     Shari Sark, MD Follow up on 05/19/2022.   Specialty: Cardiology Why: at 9 am for your cardiology follow up appointment Contact information: [place] [address] [phone]                Discharge Instructions     Amb Referral to Cardiac Rehabilitation   Complete by: As directed    Not interested   Diagnosis: Coronary Stents   After initial evaluation and assessments completed: Virtual Based Care may be provided alone or in conjunction with Phase 2 Cardiac Rehab based on patient barriers.: Yes   Intensive Cardiac Rehabilitation (ICR) [place] location only OR Traditional Cardiac Rehabilitation (TCR) *If criteria for ICR are not met will enroll in TCR [facility] only): Yes   Diet - low sodium heart healthy   Complete by: As directed    Discharge instructions   Complete by: As directed    [place]  OF YOUR FOLLOW-UP OFFICE VISITS.  PLEASE ATTEND ALL SCHEDULED FOLLOW-UP APPOINTMENTS.   Activity: Increase activity slowly as tolerated. You may shower, but no soaking baths (or swimming) for 1 week. No driving for 24 hours. No lifting over 5 lbs for 1 week. No sexual activity for 1 week.   You May Return to Work: in 1 week (if applicable)  Wound Care: You may wash cath site gently with soap and water. Keep cath site clean and dry. If you notice pain, swelling, bleeding or pus at your cath site, please call [phone].    PLEASE DO NOT MISS ANY DOSES OF YOUR ASPIRIN/BRILINTA!!!!!   Also keep a log of you blood pressures and bring back to your follow up appt. Please call the office with any questions.   Patients taking blood thinners should generally stay away from medicines like ibuprofen, Advil, Motrin, naproxen, and Aleve due to risk of stomach bleeding. You may take Tylenol as  directed or talk to your primary doctor about alternatives.  PLEASE ENSURE THAT YOU DO NOT RUN OUT OF YOUR ASPIRIN/BRILINTA. This medication is very important to remain on for at least one year. IF you have issues obtaining this medication due to cost please CALL the office 3-5 business days prior to running out in order to prevent missing doses of this medication.    Radial Site Care  Refer to this sheet in the next few weeks. These instructions provide you with information on caring for yourself after your procedure. Your caregiver may also give you more specific instructions. Your treatment has been planned according to current medical practices, but problems sometimes occur. Call your caregiver if you have any problems or questions after your procedure.  HOME CARE INSTRUCTIONS You may shower the day after the procedure. Remove the bandage (dressing) and gently wash the site with plain soap and water. Gently pat the site dry.  Do not apply powder or lotion to the site.  Do not submerge the affected site in water for 3 to 5 days.  Inspect the site at least twice daily.  Do not flex or bend the affected arm for 24 hours.  No lifting over 5 pounds (2.3 kg) for 5 days after your procedure.  Do not drive home if you are discharged the same day of the procedure. Have someone else drive you.  You may drive 24 hours after the procedure unless otherwise instructed by your caregiver.   What to expect: Any bruising will usually fade within 1 to 2 weeks.  Blood that collects in the tissue (hematoma) may be painful to the touch. It should usually decrease in size and tenderness within 1 to 2 weeks.   SEEK IMMEDIATE MEDICAL CARE IF: You have unusual pain at the radial site.  You have redness, warmth, swelling, or pain at the radial site.  You have drainage (other than a small amount of blood on the dressing).  You have chills.  You have a fever or persistent symptoms for more than 72 hours.  You  have a fever and your symptoms suddenly get worse.  Your arm becomes pale, cool, tingly, or numb.  You have heavy bleeding from the site. Hold pressure on the site.   Increase activity slowly   Complete by: As directed         Discharge Medications   Allergies as of 04/11/2022       Reactions   Azithromycin Diarrhea   Lisinopril Cough   Sulfa Antibiotics Other (See  Comments)   Unable to recall        Medication List     STOP taking these medications    hydrochlorothiazide 25 MG tablet Commonly known as: HYDRODIURIL   ibuprofen 200 MG tablet Commonly known as: ADVIL       TAKE these medications    acetaminophen 500 MG tablet Commonly known as: TYLENOL Take 500 mg by mouth every 6 (six) hours as needed for moderate pain.   ALPRAZolam 0.5 MG tablet Commonly known as: XANAX Take 1 tablet (0.5 mg total) by mouth 2 (two) times daily.   Apple Cider Vinegar Plus Tabs Take 1 tablet by mouth daily.   aspirin EC 81 MG tablet Take 1 tablet (81 mg total) by mouth daily. Swallow whole.   Bempedoic Acid 180 MG Tabs Take 180 mg by mouth daily.   CENTRUM SILVER PO Take 1 tablet by mouth daily.   Echinacea 400 MG Caps Take 800 mg by mouth daily as needed (immune support).   ezetimibe 10 MG tablet Commonly known as: ZETIA Take 1 tablet (10 mg total) by mouth daily.   fenofibrate 160 MG tablet Take 1 tablet (160 mg total) by mouth daily.   HAWTHORNE BERRY PO Take 565 mg by mouth daily.   hydrocortisone cream 1 % Apply 1 Application topically 2 (two) times daily as needed for itching.   INOSITOL PO Take 1 capsule by mouth daily.   isosorbide mononitrate 30 MG 24 hr tablet Commonly known as: IMDUR Take 1 tablet (30 mg total) by mouth daily.   levothyroxine 75 MCG tablet Commonly known as: SYNTHROID TAKE 1 TABLET EVERY DAY BEFORE BREAKFAST   losartan 100 MG tablet Commonly known as: COZAAR Take 1 tablet (100 mg total) by mouth daily.   Lysine 500 MG  Caps Take 500 mg by mouth daily as needed (immune support).   metoprolol tartrate 25 MG tablet Commonly known as: LOPRESSOR Take 0.5 tablets (12.5 mg total) by mouth 2 (two) times daily.   Milk Thistle 250 MG Caps Take 250 mg by mouth daily.   nitroGLYCERIN 0.4 MG SL tablet Commonly known as: NITROSTAT Place 1 tablet (0.4 mg total) under the tongue every 5 (five) minutes as needed for chest pain.   OVER THE COUNTER MEDICATION Take 3 tablets by mouth daily. Cataplex E Supplement   rosuvastatin 10 MG tablet Commonly known as: CRESTOR Take 1 tablet (10 mg total) by mouth daily.   sodium fluoride 1.1 % Gel dental gel Commonly known as: DentaGel APPLY 1 APPLICATION ONTO TEETH AT BEDTIME. What changed:  how much to take how to take this when to take this additional instructions   SPIRULINA PO Take 1 tablet by mouth daily as needed (immune support).   ticagrelor 90 MG Tabs tablet Commonly known as: BRILINTA Take 1 tablet (90 mg total) by mouth 2 (two) times daily.           Outstanding Labs/Studies     Duration of Discharge Encounter   Greater than 30 minutes including physician time.  Signed, Margie Billet, NP 04/11/2022, 9:04 AM   Attending Note:   The patient was seen and examined.  Agree with assessment and plan as noted above.  Changes made to the above note as needed.  Patient seen and independently examined with Margie Billet, PA.   We discussed all aspects of the encounter. I agree with the assessment and plan as stated above.    Coronary artery disease: She is status post PCI of  her proximal LAD.  She has an occluded mid right coronary stenosis with good left-to-right collateral filling.  We will treat the RCA medically.   She will be discharged on Brilinta 90 mg twice daily, aspirin 81 mg a day    Isosorbide 30 mg a day, metoprolol 4.5 mg twice daily Rosuvastatin 10 mg a day Nitroglycerin sublingually.   She will need a follow-up appointment with her  primary cardiologist or APP in several weeks.   2.  Hyperlipidemia: She is currently on rosuvastatin.  She is also been prescribed bempedoic acid.  She will follow-up with her primary cardiologist regarding whether or not she should take the bempedoic acid.  It was very expensive and she is not sure that she will actually receive it.     I have spent a total of 40 minutes with patient reviewing hospital  notes , telemetry, EKGs, labs and examining patient as well as establishing an assessment and plan that was discussed with the patient.  > 50% of time was spent in direct patient care.    Thayer Headings, Brooke Bonito., MD, [facility] 04/12/2022, 6:11 AM [address],  [employer] Pager [phone]

## 2022-04-11 NOTE — Care Management (Addendum)
Spoke with patient at bedside. States she already has Consulting civil engineer card. Denies having any TOC needs. Reports granddaughter will transport home today.    Marthenia Rolling, MSN, RN,BSN Inpatient [facility] Case Manager [phone]

## 2022-04-11 NOTE — Progress Notes (Signed)
CARDIAC REHAB PHASE I    Pt feeling well this morning. Ambulating in room with no cp or sob. Post stent education including site care, exercise guidelines, heart healthy diet, restrictions, risk factors, smoking cessation, antiplatelet therapy importance and CRP2 reviewed. All questions and concerns addressed. Will refer to [facility] for CRP2, however pt not interested at this time. Plan for home today.    [id number] Vanessa Barbara, RN BSN 04/11/2022 8:44 AM

## 2022-04-13 ENCOUNTER — Encounter (HOSPITAL_COMMUNITY): Payer: Self-pay | Admitting: Cardiovascular Disease

## 2022-04-13 MED FILL — Nitroglycerin IV Soln 100 MCG/ML in D5W: INTRA_ARTERIAL | Qty: 10 | Status: AC

## 2022-04-14 LAB — LIPOPROTEIN A (LPA): Lipoprotein (a): 52.6 nmol/L — ABNORMAL HIGH (ref <75.0)

## 2022-04-21 ENCOUNTER — Ambulatory Visit (INDEPENDENT_AMBULATORY_CARE_PROVIDER_SITE_OTHER): Payer: Medicare HMO

## 2022-04-21 DIAGNOSIS — Z23 Encounter for immunization: Secondary | ICD-10-CM | POA: Diagnosis not present

## 2022-04-28 ENCOUNTER — Ambulatory Visit: Payer: Medicare HMO | Attending: Nurse Practitioner | Admitting: Nurse Practitioner

## 2022-04-28 ENCOUNTER — Encounter: Payer: Self-pay | Admitting: Nurse Practitioner

## 2022-04-28 VITALS — BP 140/82 | HR 59 | Ht 63.0 in | Wt 209.8 lb

## 2022-04-28 DIAGNOSIS — I10 Essential (primary) hypertension: Secondary | ICD-10-CM

## 2022-04-28 DIAGNOSIS — F439 Reaction to severe stress, unspecified: Secondary | ICD-10-CM | POA: Diagnosis not present

## 2022-04-28 DIAGNOSIS — Z79899 Other long term (current) drug therapy: Secondary | ICD-10-CM | POA: Diagnosis not present

## 2022-04-28 DIAGNOSIS — E785 Hyperlipidemia, unspecified: Secondary | ICD-10-CM | POA: Diagnosis not present

## 2022-04-28 DIAGNOSIS — I2510 Atherosclerotic heart disease of native coronary artery without angina pectoris: Secondary | ICD-10-CM

## 2022-04-28 DIAGNOSIS — E669 Obesity, unspecified: Secondary | ICD-10-CM | POA: Diagnosis not present

## 2022-04-28 MED ORDER — METOPROLOL TARTRATE 25 MG PO TABS: 12.5000 mg | ORAL_TABLET | Freq: Two times a day (BID) | ORAL | 3 refills | Status: DC

## 2022-04-28 MED ORDER — ISOSORBIDE MONONITRATE ER 30 MG PO TB24: 30.0000 mg | ORAL_TABLET | Freq: Every day | ORAL | 3 refills | Status: DC

## 2022-04-28 MED ORDER — TICAGRELOR 90 MG PO TABS: 90.0000 mg | ORAL_TABLET | Freq: Two times a day (BID) | ORAL | 3 refills | Status: DC

## 2022-04-28 MED ORDER — ASPIRIN 81 MG PO TBEC: 81.0000 mg | DELAYED_RELEASE_TABLET | Freq: Every day | ORAL | 3 refills | Status: DC

## 2022-04-28 MED ORDER — EZETIMIBE 10 MG PO TABS: 10.0000 mg | ORAL_TABLET | Freq: Every day | ORAL | 3 refills | Status: DC

## 2022-04-28 MED ORDER — ROSUVASTATIN CALCIUM 10 MG PO TABS: 10.0000 mg | ORAL_TABLET | Freq: Every day | ORAL | 3 refills | Status: DC

## 2022-04-28 NOTE — Progress Notes (Signed)
Cardiology Office Note:    Date:  04/28/2022   ID:  [name], DOB [birth year], MRN [id number]  PCP:  Chevis Pretty, [place] [facility] Cardiologist:  Rozann Lesches, MD     Referring MD: [name], Mary-Margaret, *   CC: Here for follow-up after cardiac catheterization  History of Present Illness:    [name] is a 66 y.o. female with a hx of the following:  Abnormal stress test CAD, s/p PTCA and DES to subtotal proximal LAD (04/2022) HTN HLD Stress/anxiety Obesity  In October 2023 she was arranged a Lexiscan stress test due to her chief complaint of chest pain and cardiac risk factors.  During stress EKG, it was noted there was 3 mm of horizontal ST depression in inferolateral and inferior leads.  Started at 3 minutes of stress and ended at 5 minutes after recovery.  Test was considered high risk study with large perfusion defect that was reversible along the anterior wall, inferior wall, and septum that was consistent with evidence of multivessel CAD.  LVEF was mildly reduced at 40%, findings were consistent with ischemia.  Dr. Domenic Polite who reviewed this study suggested a cardiac catheterization as the next step of care.  2D echocardiogram revealed EF 60 to 65%, grade 1 DD, no significant valvular abnormalities.  Underwent left heart cath by Dr. Shelva Majestic on April 10, 2022 that revealed multivessel CAD, and received successful PCI via PTCA and DES to subtotal proximal LAD.  DAPT with aspirin and Brilinta was recommended for 12 months.  Dr. Claiborne Billings recommended that due to total RCA occlusion with collateralization via left system, and mild to moderate left Cx stenosis, medical therapy recommended.  Beta-blocker therapy was initiated.  Recommended that depending upon blood pressure reading, amlodipine could be considered to be added in the future.  Low-dose rosuvastatin initiated, with consideration for PCP S K9 inhibitor in the future.   Today she  presents for follow-up after heart cath.  She states she is doing well since her cardiac catheterization. She denies any chest pain, shortness of breath, palpitations, syncope, presyncope, dizziness, orthopnea, PND, swelling, significant weight changes, or claudication. Does admit to stress and anxiety. 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 BP log reveals elevated SBP in AM around 160's but admits that this is stress and anxiety related with lower readings around 120's in the evenings. Tolerating current medication well. Denies any other questions or concerns.    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;  Service: Cardiovascular;  Laterality: N/A;   KNEE SURGERY     LEFT HEART CATH AND CORONARY ANGIOGRAPHY N/A 04/10/2022   Procedure: LEFT HEART CATH AND CORONARY ANGIOGRAPHY;  Surgeon: Shari Sine, MD;  Location: [facility];  Service: Cardiovascular;  Laterality:  N/A;   oral surgery tooth extraction     TONSILLECTOMY     TOTAL ABDOMINAL HYSTERECTOMY     VULVECTOMY PARTIAL Bilateral 03/30/2018   Procedure: LASER PARTIAL VULVECTOMY FOR VULVUL INTRAEPITHELIA NEOPLASIS III;  Surgeon: Shari Buff, MD;  Location: [facility];  Service: Gynecology;  Laterality: Bilateral;    Current Medications: Current Meds  Medication Sig   acetaminophen (TYLENOL) 500 MG tablet Take 500 mg by mouth every 6 (six) hours as  needed for moderate pain.   ALPRAZolam (XANAX) 0.5 MG tablet Take 1 tablet (0.5 mg total) by mouth 2 (two) times daily.   Apple Cid Vn-Grn Tea-Bit Or-Cr (APPLE CIDER VINEGAR PLUS) TABS Take 1 tablet by mouth daily.    Echinacea 400 MG CAPS Take 800 mg by mouth daily as needed (immune support).   fenofibrate 160 MG tablet Take 1 tablet (160 mg total) by mouth daily.   hydrocortisone cream 1 % Apply 1 Application topically 2 (two) times daily as needed for itching.   INOSITOL PO Take 1 capsule by mouth daily.   levothyroxine (SYNTHROID) 75 MCG tablet TAKE 1 TABLET EVERY DAY BEFORE BREAKFAST   losartan (COZAAR) 100 MG tablet Take 1 tablet (100 mg total) by mouth daily.   Lysine 500 MG CAPS Take 500 mg by mouth daily as needed (immune support).   Milk Thistle 250 MG CAPS Take 250 mg by mouth daily.    nitroGLYCERIN (NITROSTAT) 0.4 MG SL tablet Place 1 tablet (0.4 mg total) under the tongue every 5 (five) minutes as needed for chest pain.   OVER THE COUNTER MEDICATION Take 3 tablets by mouth daily. Cataplex E Supplement   sodium fluoride (DENTAGEL) 1.1 % GEL dental gel APPLY 1 APPLICATION ONTO TEETH AT BEDTIME. (Patient taking differently: Place 1 Application onto teeth 4 (four) times a week.)   SPIRULINA PO Take 1 tablet by mouth daily as needed (immune support).    aspirin EC 81 MG tablet Take 1 tablet (81 mg total) by mouth daily. Swallow whole.   ezetimibe (ZETIA) 10 MG tablet Take 1 tablet (10 mg total) by mouth daily.   isosorbide mononitrate (IMDUR) 30 MG 24 hr tablet Take 1 tablet (30 mg total) by mouth daily.   metoprolol tartrate (LOPRESSOR) 25 MG tablet Take 0.5 tablets (12.5 mg total) by mouth 2 (two) times daily.    rosuvastatin (CRESTOR) 10 MG tablet Take 1 tablet (10 mg total) by mouth daily.   ticagrelor (BRILINTA) 90 MG TABS tablet Take 1 tablet (90 mg total) by mouth 2 (two) times daily.     Allergies:   Azithromycin, Lisinopril, and Sulfa antibiotics   Social History    Socioeconomic History   Marital status: Married    Spouse name: Not on file   Number of children: 1   Years of education: Not on file   Highest education level: Not on file  Occupational History    Employer: [employer]  Tobacco Use   Smoking status: Some Days    Packs/day: 0.25    Years: 40.00    Total pack years: 10.00    Types: Cigarettes    Passive exposure: Never   Smokeless tobacco: Never  Vaping Use   Vaping Use: Never used  Substance and Sexual Activity   Alcohol use: Not Currently   Drug use: No   Sexual activity: Not Currently    Birth control/protection: Surgical    Comment: hyst  Other Topics Concern   Not on file  Social  History Narrative   Not on file   Social Determinants of Health   Financial Resource Strain: Not on file  Food Insecurity: No Food Insecurity (04/10/2022)   Hunger Vital Sign    Worried About Running Out of Food in the Last Year: Never true    Ran Out of Food in the Last Year: Never true  Transportation Needs: No Transportation Needs (04/10/2022)   PRAPARE - Hydrologist (Medical): No    Lack of Transportation (Non-Medical): No  Physical Activity: Not on file  Stress: Not on file  Social Connections: Not on file     Family History: The patient's family history includes Cancer in her maternal grandfather; Cancer (age of onset: 62) in her maternal aunt; Cancer (age of onset: 57) in her mother; Heart attack in her father; Heart failure in her father; Mental illness in her daughter.  ROS:   Review of Systems  Constitutional: Negative.   HENT: Negative.    Eyes: Negative.   Respiratory: Negative.    Cardiovascular: Negative.   Gastrointestinal: Negative.   Genitourinary: Negative.   Musculoskeletal: Negative.   Skin: Negative.   Neurological: Negative.   Endo/Heme/Allergies:  Negative for environmental allergies and polydipsia. Bruises/bleeds easily.  Psychiatric/Behavioral:  Negative for  depression, hallucinations, memory loss, substance abuse and suicidal ideas. The patient is nervous/anxious and has insomnia.     Please see the history of present illness.    All other systems reviewed and are negative.  EKGs/Labs/Other Studies Reviewed:    The following studies were reviewed today:   EKG:  EKG is not ordered today.   Left heart cath on April 10, 2022:   1st Mrg lesion is 50% stenosed.   Prox LAD to Mid LAD lesion is 95% stenosed.   Prox RCA to Mid RCA lesion is 100% stenosed.   RPDA lesion is 70% stenosed.   Mid LAD lesion is 30% stenosed.   Mid Cx lesion is 30% stenosed.   Prox LAD lesion is 30% stenosed.   A drug-eluting stent was successfully placed.   Post intervention, there is a 0% residual stenosis.   Post intervention, there is a 0% residual stenosis.   Multivessel CAD with 95 to 99% proximal LAD stenosis with mild calcification superiorly and 30% mid stenosis; 50% ostial stenosis in a small high marginal vessel of the circumflex with 30% mid AV groove stenosis; and superior takeoff RCA with 70% proximal stenosis followed by total occlusion after the RV marginal branch.  There is extensive collateralization to the distal PDA and PLA vessel from the left coronary injection with visualization of 70% ostial PDA stenosis.   LVEDP 17 mmHg.   Successful percutaneous coronary intervention to the subtotal proximal LAD stenosis treated with initial PTCA using a 2.5 x 12 mm balloon, due to the calcification superiorly a 2.5 x 10 mm core flex balloon, and ultimate insertion of a 3.5 x 15 mm Medtronic Onyx Frontier stent postdilated to 3.71 mm the stenosis being reduced to 0%.   RECOMMENDATION: DAPT with aspirin/Brilinta for minimum of 12 months.  With total RCA occlusion with collateralization via the left system and mild to moderate left circumflex stenoses, plan initial medical therapy for concomitant CAD.  Will initiate beta-blocker therapy, and continue  nitrates.  Depending upon blood pressure amlodipine can be added in the future.  The patient apparently did not tolerate predominantly atorvastaton; will try rechallenge statin therapy with low-dose rosuvastatin.  The patient had recently been started  on Zetia and is awaiting to start bempedoic acid pending insurance approval.  May need future  PCSK9 inhibition.  We will plan to keep in hospital overnight with medication initiation and with her home 45 minutes away with plans for discharge tomorrow.    2D complete echocardiogram on April 08, 2022:  1. Left ventricular ejection fraction, by estimation, is 60 to 65%. The  left ventricle has normal function. The left ventricle has no regional  wall motion abnormalities. Left ventricular diastolic parameters are  consistent with Grade I diastolic  dysfunction (impaired relaxation).   2. Right ventricular systolic function is normal. The right ventricular  size is normal.   3. The mitral valve is normal in structure. No evidence of mitral valve  regurgitation. No evidence of mitral stenosis.   4. The aortic valve is tricuspid. Aortic valve regurgitation is not  visualized. No aortic stenosis is present.   5. The inferior vena cava is normal in size with greater than 50%  respiratory variability, suggesting right atrial pressure of 3 mmHg.   Comparison(s): Nuclear stress test [name] 03/31/22 showed an EF of 48%.  Lexiscan on March 31, 2022:   Findings are consistent with ischemia. The study is high risk.   3.0 mm of horizontal ST depression in the inferior and inferolateral leads (II, III, aVF, V5 and V6) was noted. The ECG was positive for ischemia.  Intermediate risk Duke treadmill score of -11.   LV perfusion is abnormal.  Large, severe intensity, reversible defect from apex to base involving the anterior wall, septum, and inferior wall consistent with multivessel distribution obstructive CAD.   Left ventricular function is abnormal. Nuclear  stress EF: 48 %. The left ventricular ejection fraction is mildly decreased (45-54%). End diastolic cavity size is normal. End systolic cavity size is normal.   Image quality is adequate.   High risk study with large perfusion defects that are reversible within the anterior wall, septum, and inferior wall consistent with multivessel distribution CAD.  LVEF mildly reduced at 48%.  No obvious transient ischemic dilatation noted.  ECG also showed significant ST segment depression consistent with ischemia.     Recent Labs: 01/08/2022: ALT 24; TSH 3.860 04/11/2022: BUN 26; Creatinine, Ser 1.44; Hemoglobin 12.6; Platelets 230; Potassium 3.7; Sodium 137  Recent Lipid Panel    Component Value Date/Time   CHOL 209 (H) 12/05/2021 1026   TRIG 171 (H) 12/05/2021 1026   TRIG 344 (H) 05/28/2014 1343   HDL 37 (L) 12/05/2021 1026   HDL 42 05/28/2014 1343   CHOLHDL 5.6 (H) 12/05/2021 1026   LDLCALC 141 (H) 12/05/2021 1026     Risk Assessment/Calculations:      HYPERTENSION CONTROL Vitals:   04/28/22 1004 04/28/22 1030  BP: (!) 152/94 (!) 140/82    The patient's blood pressure is elevated above target today.  In order to address the patient's elevated BP: Blood pressure will be monitored at home to determine if medication changes need to be made.; Follow up with general cardiology has been recommended.            Physical Exam:    VS:  BP (!) 140/82 (BP Location: Left Arm, Patient Position: Sitting, Cuff Size: Normal)   Pulse (!) 59   Ht '5\' 3"'$  (1.6 m)   Wt 209 lb 12.8 oz (95.2 kg)   SpO2 97%   BMI 37.16 kg/m     Wt Readings from Last 3 Encounters:  04/28/22 209 lb 12.8 oz (95.2 kg)  04/10/22 206 lb (93.4 kg)  04/03/22 206 lb 9.6 oz (93.7 kg)     GEN: Obese, 66 y.o. Caucasian female in no acute distress NECK: No JVD; No carotid bruits CARDIAC: S1/S2, RRR, no murmurs, rubs, gallops; 2+ peripheral pulses throughout, strong and equal bilaterally; scattered bruising along right  radial site from cardiac cath, no hematoma and no erythema noted.  RESPIRATORY:  Clear and diminished to auscultation without rales, wheezing or rhonchi  MUSCULOSKELETAL:  No edema; No deformity  SKIN: Warm and dry NEUROLOGIC:  Alert and oriented x 3 PSYCHIATRIC:  Pleasant, nervous affect   ASSESSMENT:    1. Coronary artery disease involving native heart without angina pectoris, unspecified vessel or lesion type   2. Medication management   3. Hyperlipidemia, unspecified hyperlipidemia type   4. Hypertension, unspecified type   5. Stress   6. Obesity (BMI 30-39.9)    PLAN:    In order of problems listed above:  CAD, s/p PTCA with DES to subtotal proximal LAD No chest pain since hospital discharge. Tolerating medications well. Continue ASA, Zetia, Fenofibrate, Imdur, Losartan, Lopressor, Rosuvastatin, and Brilinta. Will obtain CBC and BMET in 2 weeks. Heart healthy diet and regular cardiovascular exercise encouraged. Okay to begin cardiac rehab.   Cardiac Rehabilitation Eligibility Assessment  The patient is ready to start cardiac rehabilitation from a cardiac standpoint.    2. Hyperlipidemia Labs from November 2023 revealed total cholesterol 209, HDL 37, LDL 141, and triglycerides 171.  She was started on rosuvastatin 10 mg daily in the hospital.  Also continue Zetia and fenofibrate. Waiting on insurance approval with bempedoic acid; however patient has some concerns about starting this medication.  Plan to obtain FLP and LFT in 2 months after starting this medication, and if next labs do not show improvement, consider PCSK9 inhibitor. Heart healthy diet and regular cardiovascular exercise encouraged.   3. Hypertension Blood pressure on arrival, 152/94.  Repeat BP by me 140/82.  Does admit to stress and anxiety at this office visit. Discussed to monitor BP at home at least 2 hours after medications and sitting for 5-10 minutes.  Given BP log and will let me know her readings in the next  2 weeks via MyChart.  Continue current medication regimen. Will obtain BMET in the next 2 weeks. If SBP is not at goal (< 140) in next 2 weeks, plan to increase metoprolol tartrate to 25 mg BID.   4.  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  Offered Education officer, museum referral including resources, she politely declines. Continue to follow with PCP.  5. Obesity BMI 37.16. Weight loss via diet and exercise encouraged. Discussed the impact being overweight would have on cardiovascular risk. Heart healthy diet and regular cardiovascular exercise encouraged.   6. Disposition: Will refill her medications upon her request: Brilinta, Crestor, metoprolol tartrate, aspirin, Imdur, and Zetia.  She is requesting that I ask Dr. Domenic Polite regarding taking vitamin and coenzyme Q 10 with Brilinta, will route this note to him.  Follow-up with Dr. Domenic Polite in 1 month or sooner if anything changes.    Medication Adjustments/Labs and Tests Ordered: Current medicines are reviewed at length with the patient today.  Concerns regarding medicines are outlined above.  Orders Placed This Encounter  Procedures   CBC   Basic metabolic panel   Meds ordered this encounter  Medications   ticagrelor (BRILINTA) 90 MG TABS tablet    Sig: Take 1 tablet (90 mg total) by mouth 2 (two) times  daily.    Dispense:  180 tablet    Refill:  3   rosuvastatin (CRESTOR) 10 MG tablet    Sig: Take 1 tablet (10 mg total) by mouth daily.    Dispense:  90 tablet    Refill:  3   metoprolol tartrate (LOPRESSOR) 25 MG tablet    Sig: Take 0.5 tablets (12.5 mg total) by mouth 2 (two) times daily.    Dispense:  90 tablet    Refill:  3   aspirin EC 81 MG tablet    Sig: Take 1 tablet (81 mg total) by mouth daily. Swallow whole.    Dispense:  90 tablet    Refill:  3    New 04/03/2022   isosorbide mononitrate (IMDUR) 30 MG 24 hr tablet    Sig: Take 1 tablet (30 mg total) by mouth daily.    Dispense:  90 tablet    Refill:   3    New 04/03/2022   ezetimibe (ZETIA) 10 MG tablet    Sig: Take 1 tablet (10 mg total) by mouth daily.    Dispense:  90 tablet    Refill:  3    New 04/03/2022    Patient Instructions  Medication Instructions:  Your physician recommends that you continue on your current medications as directed. Please refer to the Current Medication list given to you today.   Labwork: CBC, BMET (2 weeks 05/12/22 @ [facility])  Testing/Procedures: none  Follow-Up:  Your physician recommends that you schedule a follow-up appointment in: Follow Up as scheduled with Dr.  Domenic Polite.  Any Other Special Instructions Will Be Listed Below (If Applicable).  Please send a MyChart message in 2 weeks with your blood pressure readings.  If you need a refill on your cardiac medications before your next appointment, please call your pharmacy.    SignedFinis Bud, NP  04/28/2022 11:15 AM    [place]

## 2022-04-28 NOTE — Patient Instructions (Signed)
Medication Instructions:  Your physician recommends that you continue on your current medications as directed. Please refer to the Current Medication list given to you today.   Labwork: CBC, BMET (2 weeks 05/12/22 @ [facility])  Testing/Procedures: none  Follow-Up:  Your physician recommends that you schedule a follow-up appointment in: Follow Up as scheduled with Dr.  Domenic Polite.  Any Other Special Instructions Will Be Listed Below (If Applicable).  Please send a MyChart message in 2 weeks with your blood pressure readings.  If you need a refill on your cardiac medications before your next appointment, please call your pharmacy.

## 2022-05-12 ENCOUNTER — Encounter: Payer: Self-pay | Admitting: Cardiology

## 2022-05-12 ENCOUNTER — Other Ambulatory Visit (HOSPITAL_COMMUNITY)
Admission: RE | Admit: 2022-05-12 | Discharge: 2022-05-12 | Disposition: A | Discharge status: Home / Self Care | Payer: Medicare HMO | Source: Ambulatory Visit | Attending: Nurse Practitioner | Admitting: Nurse Practitioner

## 2022-05-12 DIAGNOSIS — Z79899 Other long term (current) drug therapy: Secondary | ICD-10-CM | POA: Insufficient documentation

## 2022-05-12 LAB — CBC
HCT: 39.2 % (ref 36.0–46.0)
Hemoglobin: 13.3 g/dL (ref 12.0–15.0)
MCH: 30.8 pg (ref 26.0–34.0)
MCHC: 33.9 g/dL (ref 30.0–36.0)
MCV: 90.7 fL (ref 80.0–100.0)
Platelets: 192 10*3/uL (ref 150–400)
RBC: 4.32 MIL/uL (ref 3.87–5.11)
RDW: 13.4 % (ref 11.5–15.5)
WBC: 6.5 10*3/uL (ref 4.0–10.5)
nRBC: 0 % (ref 0.0–0.2)

## 2022-05-12 LAB — BASIC METABOLIC PANEL
Anion gap: 10 (ref 5–15)
BUN: 23 mg/dL (ref 8–23)
CO2: 24 mmol/L (ref 22–32)
Calcium: 9.5 mg/dL (ref 8.9–10.3)
Chloride: 107 mmol/L (ref 98–111)
Creatinine, Ser: 1.26 mg/dL — ABNORMAL HIGH (ref 0.44–1.00)
GFR, Estimated: 47 mL/min — ABNORMAL LOW (ref >60)
Glucose, Bld: 121 mg/dL — ABNORMAL HIGH (ref 70–99)
Potassium: 4.1 mmol/L (ref 3.5–5.1)
Sodium: 141 mmol/L (ref 135–145)

## 2022-05-18 ENCOUNTER — Encounter: Payer: Self-pay | Admitting: Cardiology

## 2022-05-18 NOTE — Progress Notes (Unsigned)
Cardiology Office Note  Date: 05/19/2022   ID: [name], DOB [birth year], MRN [id number]  PCP:  Chevis Pretty, FNP  Cardiologist:  Rozann Lesches, MD Electrophysiologist:  None   Chief Complaint  Patient presents with   Cardiac follow-up    History of Present Illness: [name] is a 66 y.o. female last seen in November by Ms. Arlington Calix NP.  She is here with her husband for a follow-up visit.  Reports no angina, stable NYHA class II dyspnea, no palpitations or syncope.  We went over her medications today.  She is no longer taking any supplements.  Reports no obvious intolerance to current dose of Crestor, she is also on Zetia.  Interval LP(a) was normal.  She states that she is to have a follow-up lipid panel with her PCP in January, we can review those numbers although she still may not be quite at goal since she has only been on treatment since November.  No spontaneous bleeding problems on aspirin and Brilinta.  She has not had to use any nitroglycerin.  We had nursing check her home blood pressure cuffs today in terms of correlation with our measurements.  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   Chronic hoarseness    Colon cancer ([place]) 2014   Sigmoidectomy   Dysphagia    Hyperlipidemia    Hypertension    Hypertriglyceridemia     Past Surgical History:  Procedure Laterality Date   COLON RESECTION     COLONOSCOPY     04/2020   CORONARY STENT INTERVENTION N/A 04/10/2022   Procedure: CORONARY STENT INTERVENTION;  Surgeon: Troy Sine, MD;  Location: [facility];  Service: Cardiovascular;  Laterality: N/A;   KNEE SURGERY     LEFT HEART CATH AND CORONARY ANGIOGRAPHY N/A 04/10/2022   Procedure: LEFT HEART CATH AND CORONARY ANGIOGRAPHY;  Surgeon: Troy Sine, MD;  Location: [facility];   Service: Cardiovascular;  Laterality: N/A;   oral surgery tooth extraction     TONSILLECTOMY     TOTAL ABDOMINAL HYSTERECTOMY     VULVECTOMY PARTIAL Bilateral 03/30/2018   Procedure: LASER PARTIAL VULVECTOMY FOR VULVUL INTRAEPITHELIA NEOPLASIS III;  Surgeon: Florian Buff, MD;  Location: [facility];  Service: Gynecology;  Laterality: Bilateral;    Current Outpatient Medications  Medication Sig Dispense Refill   acetaminophen (TYLENOL) 500 MG tablet Take 500 mg by mouth every 6 (six) hours as needed for moderate pain.     ALPRAZolam (XANAX) 0.5 MG tablet Take 1 tablet (0.5 mg total) by mouth 2 (two) times daily. 180 tablet 1   aspirin EC 81 MG tablet Take 1 tablet (81 mg total) by mouth daily. Swallow whole. 90 tablet 3   ezetimibe (ZETIA) 10 MG tablet Take 1 tablet (10 mg total) by mouth daily. 90 tablet 3   fenofibrate 160 MG tablet Take 1 tablet (160 mg total) by mouth daily. 90 tablet 1   hydrocortisone cream 1 % Apply 1 Application topically 2 (two) times daily as needed for itching.     isosorbide mononitrate (IMDUR) 30 MG 24 hr tablet Take 1 tablet (30 mg total) by mouth daily. 90 tablet 3   levothyroxine (SYNTHROID) 75 MCG tablet TAKE 1 TABLET EVERY DAY BEFORE BREAKFAST 90 tablet 1  losartan (COZAAR) 100 MG tablet Take 1 tablet (100 mg total) by mouth daily. 90 tablet 1   metoprolol tartrate (LOPRESSOR) 25 MG tablet Take 0.5 tablets (12.5 mg total) by mouth 2 (two) times daily. 90 tablet 3   nitroGLYCERIN (NITROSTAT) 0.4 MG SL tablet Place 1 tablet (0.4 mg total) under the tongue every 5 (five) minutes as needed for chest pain. 20 tablet 2   rosuvastatin (CRESTOR) 10 MG tablet Take 1 tablet (10 mg total) by mouth daily. 90 tablet 3   sodium fluoride (DENTAGEL) 1.1 % GEL dental gel APPLY 1 APPLICATION ONTO TEETH AT BEDTIME. (Patient taking differently: Place 1 Application onto teeth 4 (four) times a week.) 112 g 5   ticagrelor (BRILINTA) 90 MG TABS tablet Take 1 tablet (90 mg total) by  mouth 2 (two) times daily. 180 tablet 3   No current facility-administered medications for this visit.   Allergies:  Azithromycin, Lisinopril, and Sulfa antibiotics   ROS: No syncope.  Physical Exam: VS:  BP 138/88   Pulse (!) 59   Ht '5\' 3"'$  (1.6 m)   Wt 208 lb (94.3 kg)   SpO2 97%   BMI 36.85 kg/m , BMI Body mass index is 36.85 kg/m.  Wt Readings from Last 3 Encounters:  05/19/22 208 lb (94.3 kg)  04/28/22 209 lb 12.8 oz (95.2 kg)  04/10/22 206 lb (93.4 kg)    General: Patient appears comfortable at rest. HEENT: Conjunctiva and lids normal. Neck: Supple, no elevated JVP or carotid bruits. Lungs: Clear to auscultation, nonlabored breathing at rest. Cardiac: Regular rate and rhythm, no S3 or significant systolic murmur, no pericardial rub. Extremities: No pitting edema.  ECG:  An ECG dated 04/10/2022 was personally reviewed today and demonstrated:  Sinus bradycardia.  Recent Labwork: 01/08/2022: ALT 24; AST 16; TSH 3.860 05/12/2022: BUN 23; Creatinine, Ser 1.26; Hemoglobin 13.3; Platelets 192; Potassium 4.1; Sodium 141     Component Value Date/Time   CHOL 209 (H) 12/05/2021 1026   TRIG 171 (H) 12/05/2021 1026   TRIG 344 (H) 05/28/2014 1343   HDL 37 (L) 12/05/2021 1026   HDL 42 05/28/2014 1343   CHOLHDL 5.6 (H) 12/05/2021 1026   LDLCALC 141 (H) 12/05/2021 1026  November 2023: Lipoprotein a 52.6 (normal)  Other Studies Reviewed Today:  Echocardiogram 04/08/2022:  1. Left ventricular ejection fraction, by estimation, is 60 to 65%. The  left ventricle has normal function. The left ventricle has no regional  wall motion abnormalities. Left ventricular diastolic parameters are  consistent with Grade I diastolic  dysfunction (impaired relaxation).   2. Right ventricular systolic function is normal. The right ventricular  size is normal.   3. The mitral valve is normal in structure. No evidence of mitral valve  regurgitation. No evidence of mitral stenosis.   4. The  aortic valve is tricuspid. Aortic valve regurgitation is not  visualized. No aortic stenosis is present.   5. The inferior vena cava is normal in size with greater than 50%  respiratory variability, suggesting right atrial pressure of 3 mmHg.   Cardiac catheterization 04/10/2022:   1st Mrg lesion is 50% stenosed.   Prox LAD to Mid LAD lesion is 95% stenosed.   Prox RCA to Mid RCA lesion is 100% stenosed.   RPDA lesion is 70% stenosed.   Mid LAD lesion is 30% stenosed.   Mid Cx lesion is 30% stenosed.   Prox LAD lesion is 30% stenosed.   A drug-eluting stent was successfully placed.  Post intervention, there is a 0% residual stenosis.   Post intervention, there is a 0% residual stenosis.   Multivessel CAD with 95 to 99% proximal LAD stenosis with mild calcification superiorly and 30% mid stenosis; 50% ostial stenosis in a small high marginal vessel of the circumflex with 30% mid AV groove stenosis; and superior takeoff RCA with 70% proximal stenosis followed by total occlusion after the RV marginal branch.  There is extensive collateralization to the distal PDA and PLA vessel from the left coronary injection with visualization of 70% ostial PDA stenosis.   LVEDP 17 mmHg.   Successful percutaneous coronary intervention to the subtotal proximal LAD stenosis treated with initial PTCA using a 2.5 x 12 mm balloon, due to the calcification superiorly a 2.5 x 10 mm core flex balloon, and ultimate insertion of a 3.5 x 15 mm Medtronic Onyx Frontier stent postdilated to 3.71 mm the stenosis being reduced to 0%.   RECOMMENDATION: DAPT with aspirin/Brilinta for minimum of 12 months.  With total RCA occlusion with collateralization via the left system and mild to moderate left circumflex stenoses, plan initial medical therapy for concomitant CAD.  Assessment and Plan:  1.  CAD status post DES to the proximal LAD in November with residual RCA/PDA disease managed medically.  She reports no active angina  or nitroglycerin use at this time.  Continue aspirin, Brilinta, Cozaar, Imdur, Lopressor, Crestor, and Zetia.  Heart rate running in the 50s without obvious symptomatology.  Continue to follow and if symptomatic bradycardia becomes a problem would stop her Lopressor.  2.  Mixed hyperlipidemia with history of previous statin intolerance although doing well on Crestor 10 mg daily and Zetia 10 mg daily so far.  She will have repeat lipids with PCP in January, can see how her trend is going at that time although still may not be quite at goal treatment effect since she just started therapy and November.  Ideally would like to get her LDL in the 60s.  3.  Essential hypertension, currently on Cozaar and low-dose Lopressor.  Would not advance Lopressor for additional blood pressure control given bradycardia.  If her blood pressure trends higher, consider addition of low-dose Norvasc.  Medication Adjustments/Labs and Tests Ordered: Current medicines are reviewed at length with the patient today.  Concerns regarding medicines are outlined above.   Tests Ordered: No orders of the defined types were placed in this encounter.   Medication Changes: No orders of the defined types were placed in this encounter.   Disposition:  Follow up  3 months.  Signed, Satira Sark, MD, [facility] 05/19/2022 9:31 AM    [facility]. [address], [place], [place] [address] Phone: [phone]; Fax: [phone]

## 2022-05-19 ENCOUNTER — Encounter: Payer: Self-pay | Admitting: Cardiology

## 2022-05-19 ENCOUNTER — Ambulatory Visit: Payer: Medicare HMO | Attending: Cardiology | Admitting: Cardiology

## 2022-05-19 VITALS — BP 138/88 | HR 59 | Ht 63.0 in | Wt 208.0 lb

## 2022-05-19 DIAGNOSIS — I25119 Atherosclerotic heart disease of native coronary artery with unspecified angina pectoris: Secondary | ICD-10-CM

## 2022-05-19 DIAGNOSIS — E782 Mixed hyperlipidemia: Secondary | ICD-10-CM | POA: Diagnosis not present

## 2022-05-19 DIAGNOSIS — I10 Essential (primary) hypertension: Secondary | ICD-10-CM

## 2022-05-19 NOTE — Patient Instructions (Signed)
Medication Instructions:  ?Your physician recommends that you continue on your current medications as directed. Please refer to the Current Medication list given to you today. ? ? ?Labwork: ?None today ? ?Testing/Procedures: ?None today ? ?Follow-Up: ?3 months ? ?Any Other Special Instructions Will Be Listed Below (If Applicable). ? ?If you need a refill on your cardiac medications before your next appointment, please call your pharmacy. ? ?

## 2022-05-29 ENCOUNTER — Encounter: Payer: Self-pay | Admitting: Cardiology

## 2022-06-08 ENCOUNTER — Ambulatory Visit (INDEPENDENT_AMBULATORY_CARE_PROVIDER_SITE_OTHER): Payer: Medicare HMO | Admitting: Nurse Practitioner

## 2022-06-08 ENCOUNTER — Telehealth: Payer: Self-pay | Admitting: Nurse Practitioner

## 2022-06-08 ENCOUNTER — Encounter: Payer: Self-pay | Admitting: Nurse Practitioner

## 2022-06-08 VITALS — BP 137/79 | HR 64 | Temp 96.9°F | Resp 20 | Wt 204.0 lb

## 2022-06-08 DIAGNOSIS — R809 Proteinuria, unspecified: Secondary | ICD-10-CM

## 2022-06-08 DIAGNOSIS — I10 Essential (primary) hypertension: Secondary | ICD-10-CM

## 2022-06-08 DIAGNOSIS — E1129 Type 2 diabetes mellitus with other diabetic kidney complication: Secondary | ICD-10-CM

## 2022-06-08 DIAGNOSIS — Z87891 Personal history of nicotine dependence: Secondary | ICD-10-CM | POA: Diagnosis not present

## 2022-06-08 DIAGNOSIS — I2510 Atherosclerotic heart disease of native coronary artery without angina pectoris: Secondary | ICD-10-CM | POA: Diagnosis not present

## 2022-06-08 DIAGNOSIS — F5101 Primary insomnia: Secondary | ICD-10-CM

## 2022-06-08 DIAGNOSIS — F419 Anxiety disorder, unspecified: Secondary | ICD-10-CM

## 2022-06-08 DIAGNOSIS — Z9861 Coronary angioplasty status: Secondary | ICD-10-CM

## 2022-06-08 DIAGNOSIS — Z789 Other specified health status: Secondary | ICD-10-CM

## 2022-06-08 DIAGNOSIS — F3341 Major depressive disorder, recurrent, in partial remission: Secondary | ICD-10-CM | POA: Diagnosis not present

## 2022-06-08 DIAGNOSIS — E785 Hyperlipidemia, unspecified: Secondary | ICD-10-CM | POA: Diagnosis not present

## 2022-06-08 DIAGNOSIS — Z6833 Body mass index (BMI) 33.0-33.9, adult: Secondary | ICD-10-CM

## 2022-06-08 DIAGNOSIS — E039 Hypothyroidism, unspecified: Secondary | ICD-10-CM | POA: Diagnosis not present

## 2022-06-08 LAB — BAYER DCA HB A1C WAIVED: HB A1C (BAYER DCA - WAIVED): 6.1 % — ABNORMAL HIGH (ref 4.8–5.6)

## 2022-06-08 MED ORDER — ALPRAZOLAM 0.5 MG PO TABS: 0.5000 mg | ORAL_TABLET | Freq: Two times a day (BID) | ORAL | 1 refills | Status: DC

## 2022-06-08 MED ORDER — FENOFIBRATE 160 MG PO TABS: 160.0000 mg | ORAL_TABLET | Freq: Every day | ORAL | 1 refills | Status: DC

## 2022-06-08 MED ORDER — LOSARTAN POTASSIUM 100 MG PO TABS: 100.0000 mg | ORAL_TABLET | Freq: Every day | ORAL | 1 refills | Status: DC

## 2022-06-08 MED ORDER — LEVOTHYROXINE SODIUM 75 MCG PO TABS: ORAL_TABLET | ORAL | 1 refills | Status: DC

## 2022-06-08 NOTE — Telephone Encounter (Signed)
Patient wants to talk to PCP nurse about her visit on 1/8, please call back

## 2022-06-08 NOTE — Progress Notes (Signed)
1111  Subjective:    Patient ID: [name], female    DOB: 12-22-1955, 67 y.o.   MRN: [id number]   Chief Complaint: medical management of chronic issues     HPI:  [name] is a 67 y.o. who identifies as a female who was assigned female at birth.   Social history: Lives with: husband Work history: retired   Scientist, forensic in today for follow up of the following chronic medical issues:  1. Essential hypertension No c/o chest pain, sob or headache. Doe snot check blood pressure at home. BP Readings from Last 3 Encounters:  05/19/22 138/88  04/28/22 (!) 140/82  04/11/22 (!) 141/68     2. CAD S/P percutaneous coronary angioplasty Last saw cardiology on 05/19/22. According to office note- no changes were made to plan of care. Is patient becomes symptomatic with low heart rate the will stop lopressor. Is now on blood thinners Pulse Readings from Last 3 Encounters:  06/08/22 64  05/19/22 (!) 59  04/28/22 (!) 59     3. Acquired hypothyroidism No issues that she is awareof. Lab Results  Component Value Date   TSH 3.860 01/08/2022     4. Type 2 diabetes mellitus with microalbuminuria, without long-term current use of insulin (HCC) She doe snot check her blood sugars daily. Doe snot watch diet very closely. Lab Results  Component Value Date   HGBA1C 6.0 (H) 12/05/2021      5. Hyperlipidemia LDL goal <70 Does not watch diet and does no dedicated exercise. Is on crestor now and iis tolerating it well  Lab Results  Component Value Date   CHOL 209 (H) 12/05/2021   HDL 37 (L) 12/05/2021   LDLCALC 141 (H) 12/05/2021   TRIG 171 (H) 12/05/2021   CHOLHDL 5.6 (H) 12/05/2021     6. History of tobacco use Still smokes several cigarettes a day- doe snot smoke entire cigarette.   7. Recurrent major depressive disorder, in partial remission ([place]) On no antidepressant currently    06/08/2022   11:46 AM 01/08/2022    9:03 AM 12/05/2021   10:26 AM  Depression screen PHQ 2/9   Decreased Interest 0 0 1  Down, Depressed, Hopeless '1 1 1  '$ PHQ - 2 Score '1 1 2  '$ Altered sleeping '3 3 1  '$ Tired, decreased energy '1 1 1  '$ Change in appetite 0 1 1  Feeling bad or failure about yourself  0 0 0  Trouble concentrating 0 0 0  Moving slowly or fidgety/restless 0 0 0  Suicidal thoughts 0 0 0  PHQ-9 Score '5 6 5  '$ Difficult doing work/chores Not difficult at all Not difficult at all Not difficult at all     8. Anxiety Is on xanax and is doing well.    06/08/2022   11:47 AM 01/08/2022    9:03 AM 12/05/2021   10:27 AM 06/05/2021   11:43 AM  GAD 7 : Generalized Anxiety Score  Nervous, Anxious, on Edge '3 3 1 1  '$ Control/stop worrying '3 3 1 1  '$ Worry too much - different things '3 3 1 1  '$ Trouble relaxing 1 0 0 0  Restless 0 0 0 0  Easily annoyed or irritable 0 0 0 0  Afraid - awful might happen '3 3 1 3  '$ Total GAD 7 Score '13 12 4 6  '$ Anxiety Difficulty Not difficult at all Not difficult at all Not difficult at all Not difficult at all      9.  Primary insomnia Does not sleep well  10. Statin intolerance Agreed to try crestor at last visit.  11. BMI 33.0-33.9,adult Weight is down 4lbs Wt Readings from Last 3 Encounters:  06/08/22 204 lb (92.5 kg)  05/19/22 208 lb (94.3 kg)  04/28/22 209 lb 12.8 oz (95.2 kg)   BMI Readings from Last 3 Encounters:  06/08/22 36.14 kg/m  05/19/22 36.85 kg/m  04/28/22 37.16 kg/m      New complaints: None today  Allergies  Allergen Reactions   Azithromycin Diarrhea   Lisinopril Cough   Sulfa Antibiotics Other (See Comments)    Unable to recall   Outpatient Encounter Medications as of 06/08/2022  Medication Sig   acetaminophen (TYLENOL) 500 MG tablet Take 500 mg by mouth every 6 (six) hours as needed for moderate pain.   ALPRAZolam (XANAX) 0.5 MG tablet Take 1 tablet (0.5 mg total) by mouth 2 (two) times daily.   aspirin EC 81 MG tablet Take 1 tablet (81 mg total) by mouth daily. Swallow whole.   ezetimibe (ZETIA) 10 MG tablet  Take 1 tablet (10 mg total) by mouth daily.   fenofibrate 160 MG tablet Take 1 tablet (160 mg total) by mouth daily.   hydrocortisone cream 1 % Apply 1 Application topically 2 (two) times daily as needed for itching.   isosorbide mononitrate (IMDUR) 30 MG 24 hr tablet Take 1 tablet (30 mg total) by mouth daily.   levothyroxine (SYNTHROID) 75 MCG tablet TAKE 1 TABLET EVERY DAY BEFORE BREAKFAST   losartan (COZAAR) 100 MG tablet Take 1 tablet (100 mg total) by mouth daily.   metoprolol tartrate (LOPRESSOR) 25 MG tablet Take 0.5 tablets (12.5 mg total) by mouth 2 (two) times daily.   nitroGLYCERIN (NITROSTAT) 0.4 MG SL tablet Place 1 tablet (0.4 mg total) under the tongue every 5 (five) minutes as needed for chest pain.   rosuvastatin (CRESTOR) 10 MG tablet Take 1 tablet (10 mg total) by mouth daily.   sodium fluoride (DENTAGEL) 1.1 % GEL dental gel APPLY 1 APPLICATION ONTO TEETH AT BEDTIME. (Patient taking differently: Place 1 Application onto teeth 4 (four) times a week.)   ticagrelor (BRILINTA) 90 MG TABS tablet Take 1 tablet (90 mg total) by mouth 2 (two) times daily.   No facility-administered encounter medications on file as of 06/08/2022.    Past Surgical History:  Procedure Laterality Date   COLON RESECTION     COLONOSCOPY     04/2020   CORONARY STENT INTERVENTION N/A 04/10/2022   Procedure: CORONARY STENT INTERVENTION;  Surgeon: Troy Sine, MD;  Location: [facility];  Service: Cardiovascular;  Laterality: N/A;   KNEE SURGERY     LEFT HEART CATH AND CORONARY ANGIOGRAPHY N/A 04/10/2022   Procedure: LEFT HEART CATH AND CORONARY ANGIOGRAPHY;  Surgeon: Troy Sine, MD;  Location: [facility];  Service: Cardiovascular;  Laterality: N/A;   oral surgery tooth extraction     TONSILLECTOMY     TOTAL ABDOMINAL HYSTERECTOMY     VULVECTOMY PARTIAL Bilateral 03/30/2018   Procedure: LASER PARTIAL VULVECTOMY FOR VULVUL INTRAEPITHELIA NEOPLASIS III;  Surgeon: Florian Buff,  MD;  Location: [facility];  Service: Gynecology;  Laterality: Bilateral;    Family History  Problem Relation Age of Onset   Cancer Maternal Grandfather        lung   Heart attack Father    Heart failure Father    Cancer Mother 4       breast  Cancer Maternal Aunt 40       pancreas   Mental illness Daughter       Controlled substance contract: n/a     Review of Systems  Constitutional:  Negative for diaphoresis.  Eyes:  Negative for pain.  Respiratory:  Negative for shortness of breath.   Cardiovascular:  Negative for chest pain, palpitations and leg swelling.  Gastrointestinal:  Negative for abdominal pain.  Endocrine: Negative for polydipsia.  Skin:  Negative for rash.  Neurological:  Negative for dizziness, weakness and headaches.  Hematological:  Does not bruise/bleed easily.  All other systems reviewed and are negative.      Objective:   Physical Exam Vitals and nursing note reviewed.  Constitutional:      General: She is not in acute distress.    Appearance: Normal appearance. She is well-developed.  HENT:     Head: Normocephalic.     Right Ear: Tympanic membrane normal.     Left Ear: Tympanic membrane normal.     Nose: Nose normal.     Mouth/Throat:     Mouth: Mucous membranes are moist.  Eyes:     Pupils: Pupils are equal, round, and reactive to light.  Neck:     Vascular: No carotid bruit or JVD.  Cardiovascular:     Rate and Rhythm: Normal rate and regular rhythm.     Heart sounds: Normal heart sounds.  Pulmonary:     Effort: Pulmonary effort is normal. No respiratory distress.     Breath sounds: Normal breath sounds. No wheezing or rales.  Chest:     Chest wall: No tenderness.  Abdominal:     General: Bowel sounds are normal. There is no distension or abdominal bruit.     Palpations: Abdomen is soft. There is no hepatomegaly, splenomegaly, mass or pulsatile mass.     Tenderness: There is no abdominal tenderness.  Musculoskeletal:         General: Normal range of motion.     Cervical back: Normal range of motion and neck supple.  Lymphadenopathy:     Cervical: No cervical adenopathy.  Skin:    General: Skin is warm and dry.  Neurological:     Mental Status: She is alert and oriented to person, place, and time.     Deep Tendon Reflexes: Reflexes are normal and symmetric.  Psychiatric:        Behavior: Behavior normal.        Thought Content: Thought content normal.        Judgment: Judgment normal.    BP 137/79   Pulse 64   Temp (!) 96.9 F (36.1 C) (Temporal)   Resp 20   Wt 204 lb (92.5 kg)   SpO2 97%   BMI 36.14 kg/m   Hgba1c 6.1%      Assessment & Plan:  EKATERINI CAPITANO comes in today with chief complaint of Medical Management of Chronic Issues   Diagnosis and orders addressed:  1. Essential hypertension Low sodium diet - CBC with Differential/Platelet - CMP14+EGFR - losartan (COZAAR) 100 MG tablet; Take 1 tablet (100 mg total) by mouth daily.  Dispense: 90 tablet; Refill: 1  2. CAD S/P percutaneous coronary angioplasty Keep follow up with cardiology  3. Acquired hypothyroidism Labs pending - levothyroxine (SYNTHROID) 75 MCG tablet; TAKE 1 TABLET EVERY DAY BEFORE BREAKFAST  Dispense: 90 tablet; Refill: 1  4. Type 2 diabetes mellitus with microalbuminuria, without long-term current use of insulin (HCC) Continue to watch carbs in  diet - Bayer DCA Hb A1c Waived  5. Hyperlipidemia LDL goal <70 Low fat diet - Lipid panel  6. History of tobacco use Smoking cessation encourgaed  7. Recurrent major depressive disorder, in partial remission ([place]) Stress management  8. Anxiety - ALPRAZolam (XANAX) 0.5 MG tablet; Take 1 tablet (0.5 mg total) by mouth 2 (two) times daily.  Dispense: 180 tablet; Refill: 1  9. Primary insomnia Bedtime routine  10. Statin intolerance Agreed to take crestor- we will see if it has helped labs.  11. BMI 33.0-33.9,adult Discussed diet and exercise for person with  BMI >25 Will recheck weight in 3-6 months   12. Hyperlipidemia with target LDL less than 100 Labs pending - fenofibrate 160 MG tablet; Take 1 tablet (160 mg total) by mouth daily.  Dispense: 90 tablet; Refill: 1   Labs pending Health Maintenance reviewed Diet and exercise encouraged  Follow up plan: 6 months   [name] Done, FNP

## 2022-06-08 NOTE — Telephone Encounter (Signed)
Patient was reviewing AVS on Mychart and under HPI number 6 it states that she smokes a pack of cigarettes and she only smokes occasionally. Not even one cigarette daily. She wants to know if this can be changed to occasional smoker? Please advise

## 2022-06-08 NOTE — Patient Instructions (Signed)

## 2022-06-09 LAB — CBC WITH DIFFERENTIAL/PLATELET
Basophils Absolute: 0 10*3/uL (ref 0.0–0.2)
Basos: 1 %
EOS (ABSOLUTE): 0.1 10*3/uL (ref 0.0–0.4)
Eos: 2 %
Hematocrit: 42.3 % (ref 34.0–46.6)
Hemoglobin: 14.2 g/dL (ref 11.1–15.9)
Immature Grans (Abs): 0 10*3/uL (ref 0.0–0.1)
Immature Granulocytes: 1 %
Lymphocytes Absolute: 1.2 10*3/uL (ref 0.7–3.1)
Lymphs: 18 %
MCH: 30.3 pg (ref 26.6–33.0)
MCHC: 33.6 g/dL (ref 31.5–35.7)
MCV: 90 fL (ref 79–97)
Monocytes Absolute: 0.4 10*3/uL (ref 0.1–0.9)
Monocytes: 7 %
Neutrophils Absolute: 4.7 10*3/uL (ref 1.4–7.0)
Neutrophils: 71 %
Platelets: 212 10*3/uL (ref 150–450)
RBC: 4.68 x10E6/uL (ref 3.77–5.28)
RDW: 13.2 % (ref 11.7–15.4)
WBC: 6.5 10*3/uL (ref 3.4–10.8)

## 2022-06-09 LAB — CMP14+EGFR
ALT: 25 IU/L (ref 0–32)
AST: 18 IU/L (ref 0–40)
Albumin/Globulin Ratio: 2.6 — ABNORMAL HIGH (ref 1.2–2.2)
Albumin: 5.1 g/dL — ABNORMAL HIGH (ref 3.9–4.9)
Alkaline Phosphatase: 48 IU/L (ref 44–121)
BUN/Creatinine Ratio: 19 (ref 12–28)
BUN: 23 mg/dL (ref 8–27)
Bilirubin Total: 0.3 mg/dL (ref 0.0–1.2)
CO2: 22 mmol/L (ref 20–29)
Calcium: 10 mg/dL (ref 8.7–10.3)
Chloride: 104 mmol/L (ref 96–106)
Creatinine, Ser: 1.22 mg/dL — ABNORMAL HIGH (ref 0.57–1.00)
Globulin, Total: 2 g/dL (ref 1.5–4.5)
Glucose: 111 mg/dL — ABNORMAL HIGH (ref 70–99)
Potassium: 4.5 mmol/L (ref 3.5–5.2)
Sodium: 140 mmol/L (ref 134–144)
Total Protein: 7.1 g/dL (ref 6.0–8.5)
eGFR: 49 mL/min/{1.73_m2} — ABNORMAL LOW (ref >59)

## 2022-06-09 LAB — LIPID PANEL
Chol/HDL Ratio: 2.8 ratio (ref 0.0–4.4)
Cholesterol, Total: 122 mg/dL (ref 100–199)
HDL: 43 mg/dL (ref >39)
LDL Chol Calc (NIH): 56 mg/dL (ref 0–99)
Triglycerides: 129 mg/dL (ref 0–149)
VLDL Cholesterol Cal: 23 mg/dL (ref 5–40)

## 2022-06-09 NOTE — Telephone Encounter (Signed)
Corrected chart

## 2022-07-07 ENCOUNTER — Other Ambulatory Visit: Payer: Self-pay | Admitting: Home Health

## 2022-07-20 ENCOUNTER — Telehealth: Payer: Self-pay | Admitting: Cardiology

## 2022-07-20 ENCOUNTER — Ambulatory Visit: Payer: Medicare HMO | Attending: Physician Assistant | Admitting: Physician Assistant

## 2022-07-20 ENCOUNTER — Encounter: Payer: Self-pay | Admitting: Physician Assistant

## 2022-07-20 ENCOUNTER — Encounter: Payer: Self-pay | Admitting: Cardiology

## 2022-07-20 VITALS — BP 180/102 | HR 82 | Ht 62.5 in | Wt 211.2 lb

## 2022-07-20 DIAGNOSIS — I10 Essential (primary) hypertension: Secondary | ICD-10-CM

## 2022-07-20 DIAGNOSIS — E785 Hyperlipidemia, unspecified: Secondary | ICD-10-CM | POA: Diagnosis not present

## 2022-07-20 DIAGNOSIS — I25119 Atherosclerotic heart disease of native coronary artery with unspecified angina pectoris: Secondary | ICD-10-CM | POA: Diagnosis not present

## 2022-07-20 MED ORDER — AMLODIPINE BESYLATE 5 MG PO TABS: 5.0000 mg | ORAL_TABLET | Freq: Every day | ORAL | 3 refills | Status: DC

## 2022-07-20 MED ORDER — AMLODIPINE BESYLATE 5 MG PO TABS: 5.0000 mg | ORAL_TABLET | Freq: Every day | ORAL | 0 refills | Status: DC

## 2022-07-20 NOTE — Progress Notes (Signed)
Cardiology Office Note:    Date:  07/20/2022   ID:  [name], DOB 30-Apr-1956, MRN [id number]  PCP:  Chevis Pretty, [place] [facility] Cardiologist:  Rozann Lesches, MD     Referring MD: Chevis Pretty, *   Chief Complaint:  No chief complaint on file.     History of Present Illness:   [name] is a 67 y.o. female with history of of HTN, HLD, CAD DES pLAD 04/2022.   Patient comes in because her BP has been running high. She wants to start HCTZ back-it was stopped in hospital when Imdur was added. Getting some extra salt recently.        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   ALPRAZolam (XANAX) 0.5 MG tablet Take 1 tablet (0.5 mg total) by mouth 2 (two) times daily.   amLODipine (NORVASC) 5 MG tablet Take 1 tablet (5 mg total) by mouth daily.   amLODipine (NORVASC) 5 MG tablet Take 1 tablet (5 mg total) by mouth daily.   aspirin EC 81 MG tablet Take 1 tablet (81 mg total) by mouth daily. Swallow whole.   ezetimibe (ZETIA) 10 MG tablet Take 1 tablet (10 mg total) by mouth daily.   fenofibrate 160 MG tablet Take 1 tablet (160 mg total) by mouth daily.   hydrocortisone cream 1 % Apply 1 Application topically 2 (two) times daily as needed for itching.   isosorbide mononitrate (IMDUR) 30 MG 24 hr tablet Take 1 tablet (30 mg total) by mouth daily.   levothyroxine (SYNTHROID) 75 MCG tablet TAKE 1 TABLET EVERY DAY BEFORE BREAKFAST   losartan (COZAAR) 100 MG tablet Take 1 tablet (100 mg total) by  mouth daily.   metoprolol tartrate (LOPRESSOR) 25 MG tablet TAKE 0.5 TABLET (12.5 MG TOTAL) BY MOUTH 2 TIMES DAILY.   nitroGLYCERIN (NITROSTAT) 0.4 MG SL tablet Place 1 tablet (0.4 mg total) under the tongue every 5 (five) minutes as needed for chest pain.   rosuvastatin (CRESTOR) 10 MG tablet TAKE 1 TABLET BY MOUTH EVERY DAY   sodium fluoride (DENTAGEL) 1.1 % GEL dental gel APPLY 1 APPLICATION ONTO TEETH AT BEDTIME. (Patient taking differently: Place 1 Application onto teeth 4 (four) times a week.)   ticagrelor (BRILINTA) 90 MG TABS tablet Take 1 tablet (90 mg total) by mouth 2 (two) times daily.    Allergies:   Azithromycin, Lisinopril, and Sulfa antibiotics   Social History   Tobacco Use   Smoking status: Some Days    Packs/day: 0.25    Years: 40.00    Total pack years: 10.00    Types: Cigarettes    Passive exposure: Never   Smokeless tobacco: Never  Vaping Use   Vaping Use: Never used  Substance Use Topics   Alcohol use: Not Currently   Drug use: No    Family Hx: The patient's family history includes Cancer in her maternal grandfather; Cancer (age of onset: 72)  in her maternal aunt; Cancer (age of onset: 34) in her mother; Heart attack in her father; Heart failure in her father; Mental illness in her daughter.  ROS     Physical Exam:    VS:  BP (!) 180/102   Pulse 82   Ht 5' 2.5" (1.588 m)   Wt 211 lb 3.2 oz (95.8 kg)   SpO2 97%   BMI 38.01 kg/m     Wt Readings from Last 3 Encounters:  07/20/22 211 lb 3.2 oz (95.8 kg)  06/08/22 204 lb (92.5 kg)  05/19/22 208 lb (94.3 kg)    Physical Exam  GEN: Obese, in no acute distress  Neck: no JVD, carotid bruits, or masses Cardiac:RRR; no murmurs, rubs, or gallops  Respiratory:  clear to auscultation bilaterally, normal work of breathing GI: soft, nontender, nondistended, + BS Ext: without cyanosis, clubbing, or edema, Good distal pulses bilaterally Neuro:  Alert and Oriented x 3,  Psych: euthymic mood, full affect         EKGs/Labs/Other Test Reviewed:    EKG:  EKG is not ordered today.     Recent Labs: 01/08/2022: TSH 3.860 06/08/2022: ALT 25; BUN 23; Creatinine, Ser 1.22; Hemoglobin 14.2; Platelets 212; Potassium 4.5; Sodium 140   Recent Lipid Panel Recent Labs    06/08/22 1138  CHOL 122  TRIG 129  HDL 43  LDLCALC 56     Prior CV Studies:   Echocardiogram 04/08/2022:  1. Left ventricular ejection fraction, by estimation, is 60 to 65%. The  left ventricle has normal function. The left ventricle has no regional  wall motion abnormalities. Left ventricular diastolic parameters are  consistent with Grade I diastolic  dysfunction (impaired relaxation).   2. Right ventricular systolic function is normal. The right ventricular  size is normal.   3. The mitral valve is normal in structure. No evidence of mitral valve  regurgitation. No evidence of mitral stenosis.   4. The aortic valve is tricuspid. Aortic valve regurgitation is not  visualized. No aortic stenosis is present.   5. The inferior vena cava is normal in size with greater than 50%  respiratory variability, suggesting right atrial pressure of 3 mmHg.    Cardiac catheterization 04/10/2022:   1st Mrg lesion is 50% stenosed.   Prox LAD to Mid LAD lesion is 95% stenosed.   Prox RCA to Mid RCA lesion is 100% stenosed.   RPDA lesion is 70% stenosed.   Mid LAD lesion is 30% stenosed.   Mid Cx lesion is 30% stenosed.   Prox LAD lesion is 30% stenosed.   A drug-eluting stent was successfully placed.   Post intervention, there is a 0% residual stenosis.   Post intervention, there is a 0% residual stenosis.   Multivessel CAD with 95 to 99% proximal LAD stenosis with mild calcification superiorly and 30% mid stenosis; 50% ostial stenosis in a small high marginal vessel of the circumflex with 30% mid AV groove stenosis; and superior takeoff RCA with 70% proximal stenosis followed by total occlusion after the RV marginal branch.  There is  extensive collateralization to the distal PDA and PLA vessel from the left coronary injection with visualization of 70% ostial PDA stenosis.   LVEDP 17 mmHg.   Successful percutaneous coronary intervention to the subtotal proximal LAD stenosis treated with initial PTCA using a 2.5 x 12 mm balloon, due to the calcification superiorly a 2.5 x 10 mm core flex balloon, and ultimate insertion of a 3.5 x 15 mm  Medtronic Onyx Frontier stent postdilated to 3.71 mm the stenosis being reduced to 0%.   RECOMMENDATION: DAPT with aspirin/Brilinta for minimum of 12 months.  With total RCA occlusion with collateralization via the left system and mild to moderate left circumflex stenoses, plan initial medical therapy for concomitant CAD.     Risk Assessment/Calculations/Metrics:         HYPERTENSION CONTROL Vitals:   07/20/22 1044 07/20/22 1113  BP: (!) 160/98 (!) 180/102    The patient's blood pressure is elevated above target today.  In order to address the patient's elevated BP: Blood pressure will be monitored at home to determine if medication changes need to be made.; A new medication was prescribed today.; Follow up with general cardiology has been recommended.       ASSESSMENT & PLAN:   No problem-specific Assessment & Plan notes found for this encounter.    CAD status post DES to the proximal LAD in November with residual RCA/PDA disease managed medically.  no  angina.Continue aspirin, Brilinta, Cozaar, Imdur, Lopressor, Crestor, and Zetia.      Mixed hyperlipidemia with history of previous statin intolerance although doing well on Crestor 10 mg daily and Zetia 10 mg daily so far. LDL 56 06/08/22   Essential hypertension, currently on Cozaar and low-dose Lopressor, Imdur  BP running high. Will add Norvasc 5 mg daily, not HCTZ  with Crt 1.22 last month.              Dispo:  No follow-ups on file.   Medication Adjustments/Labs and Tests Ordered: Current medicines are reviewed at  length with the patient today.  Concerns regarding medicines are outlined above.  Tests Ordered: No orders of the defined types were placed in this encounter.  Medication Changes: Meds ordered this encounter  Medications   amLODipine (NORVASC) 5 MG tablet    Sig: Take 1 tablet (5 mg total) by mouth daily.    Dispense:  90 tablet    Refill:  3   amLODipine (NORVASC) 5 MG tablet    Sig: Take 1 tablet (5 mg total) by mouth daily.    Dispense:  30 tablet    Refill:  0   Shari Boast, PA-C  07/20/2022 11:24 AM    [address], [place], [place]  [address] Phone: [phone]; Fax: [phone]

## 2022-07-20 NOTE — Telephone Encounter (Signed)
[name]  P [facility] Triage (supporting Satira Sark, MD)24 minutes ago (8:56 AM)      My Blood Pressure has spiking over week end when I sleep is it possible to be seen today. Is there some reason I cannot take the hydrochlorathiazide?  I really feel like the hydrochlorathiazide will help this, it did before, maybe not the 67m, maybe 12.5 Can those pills be broken in 1/2? I still have plenty of those. My husband takes them, my sister takes them and their blood pressure is fine I left a message with the call center [phone] [phone]. She said she would contact the nurse. Just so this doesn't get duplicated I have an appointment coming with EFinis Budthe 19th of March, but this can't wait that long. [name]     07/20/22:  Upon reviewing chart, on hospital discharge instructions from 04/2022, HCTZ was discontinued.  Patient cardiology office visit on 05/19/22 mentioned starting low dose amlodipine for BP if needed. I will forward to [place]St. [place]for review.

## 2022-07-20 NOTE — Telephone Encounter (Signed)
Pt c/o BP issue: STAT if pt c/o blurred vision, one-sided weakness or slurred speech  1. What are your last 5 BP readings? 188/107 196/111 77 175/97  75 158/92  75  2. Are you having any other symptoms (ex. Dizziness, headache, blurred vision, passed out)? Patient states no symptoms.    3. What is your BP issue? Elevated BP during the night, she stated it's fine during the day.  She stated she was on hydrochlorothiazide and taken off of it, she feels she needs to go back on it.

## 2022-07-20 NOTE — Patient Instructions (Signed)
Medication Instructions:  START Amlodipine 5 mg daily  30 day supply to local pharmacy  90 day supply to mail order   Labwork: None today  Testing/Procedures: None today  Follow-Up: Keep scheduled appointment  Any Other Special Instructions Will Be Listed Below (If Applicable).   MyChart Message [place] Friday with BP readings  You may stop taking BP at 3 am  If you need a refill on your cardiac medications before your next appointment, please call your pharmacy.     Two Gram Sodium Diet 2000 mg  What is Sodium? Sodium is a mineral found naturally in many foods. The most significant source of sodium in the diet is table salt, which is about 40% sodium.  Processed, convenience, and preserved foods also contain a large amount of sodium.  The body needs only 500 mg of sodium daily to function,  A normal diet provides more than enough sodium even if you do not use salt.  Why Limit Sodium? A build up of sodium in the body can cause thirst, increased blood pressure, shortness of breath, and water retention.  Decreasing sodium in the diet can reduce edema and risk of heart attack or stroke associated with high blood pressure.  Keep in mind that there are many other factors involved in these health problems.  Heredity, obesity, lack of exercise, cigarette smoking, stress and what you eat all play a role.  General Guidelines: Do not add salt at the table or in cooking.  One teaspoon of salt contains over 2 grams of sodium. Read food labels Avoid processed and convenience foods Ask your dietitian before eating any foods not dicussed in the menu planning guidelines Consult your physician if you wish to use a salt substitute or a sodium containing medication such as antacids.  Limit milk and milk products to 16 oz (2 cups) per day.  Shopping Hints: READ LABELS!! "Dietetic" does not necessarily mean low sodium. Salt and other sodium ingredients are often added to foods during  processing.    Menu Planning Guidelines Food Group Choose More Often Avoid  Beverages (see also the milk group All fruit juices, low-sodium, salt-free vegetables juices, low-sodium carbonated beverages Regular vegetable or tomato juices, commercially softened water used for drinking or cooking  Breads and Cereals Enriched white, wheat, rye and pumpernickel bread, hard rolls and dinner rolls; muffins, cornbread and waffles; most dry cereals, cooked cereal without added salt; unsalted crackers and breadsticks; low sodium or homemade bread crumbs Bread, rolls and crackers with salted tops; quick breads; instant hot cereals; pancakes; commercial bread stuffing; self-rising flower and biscuit mixes; regular bread crumbs or cracker crumbs  Desserts and Sweets Desserts and sweets mad with mild should be within allowance Instant pudding mixes and cake mixes  Fats Butter or margarine; vegetable oils; unsalted salad dressings, regular salad dressings limited to 1 Tbs; light, sour and heavy cream Regular salad dressings containing bacon fat, bacon bits, and salt pork; snack dips made with instant soup mixes or processed cheese; salted nuts  Fruits Most fresh, frozen and canned fruits Fruits processed with salt or sodium-containing ingredient (some dried fruits are processed with sodium sulfites        Vegetables Fresh, frozen vegetables and low- sodium canned vegetables Regular canned vegetables, sauerkraut, pickled vegetables, and others prepared in brine; frozen vegetables in sauces; vegetables seasoned with ham, bacon or salt pork  Condiments, Sauces, Miscellaneous  Salt substitute with physician's approval; pepper, herbs, spices; vinegar, lemon or lime juice; hot pepper sauce; garlic  powder, onion powder, low sodium soy sauce (1 Tbs.); low sodium condiments (ketchup, chili sauce, mustard) in limited amounts (1 tsp.) fresh ground horseradish; unsalted tortilla chips, pretzels, potato chips, popcorn, salsa  (1/4 cup) Any seasoning made with salt including garlic salt, celery salt, onion salt, and seasoned salt; sea salt, rock salt, kosher salt; meat tenderizers; monosodium glutamate; mustard, regular soy sauce, barbecue, sauce, chili sauce, teriyaki sauce, steak sauce, Worcestershire sauce, and most flavored vinegars; canned gravy and mixes; regular condiments; salted snack foods, olives, picles, relish, horseradish sauce, catsup   Food preparation: Try these seasonings Meats:    Pork Sage, onion Serve with applesauce  Chicken Poultry seasoning, thyme, parsley Serve with cranberry sauce  Lamb Curry powder, rosemary, garlic, thyme Serve with mint sauce or jelly  Veal Marjoram, basil Serve with current jelly, cranberry sauce  Beef Pepper, bay leaf Serve with dry mustard, unsalted chive butter  Fish Bay leaf, dill Serve with unsalted lemon butter, unsalted parsley butter  Vegetables:    Asparagus Lemon juice   Broccoli Lemon juice   Carrots Mustard dressing parsley, mint, nutmeg, glazed with unsalted butter and sugar   Green beans Marjoram, lemon juice, nutmeg,dill seed   Tomatoes Basil, marjoram, onion   Spice /blend for [employer]" 4 tsp ground thyme 1 tsp ground sage 3 tsp ground rosemary 4 tsp ground marjoram   Test your knowledge A product that says "Salt Free" may still contain sodium. True or False Garlic Powder and Hot Pepper Sauce an be used as alternative seasonings.True or False Processed foods have more sodium than fresh foods.  True or False Canned Vegetables have less sodium than froze True or False   WAYS TO DECREASE YOUR SODIUM INTAKE Avoid the use of added salt in cooking and at the table.  Table salt (and other prepared seasonings which contain salt) is probably one of the greatest sources of sodium in the diet.  Unsalted foods can gain flavor from the sweet, sour, and butter taste sensations of herbs and spices.  Instead of using salt for seasoning, try the following  seasonings with the foods listed.  Remember: how you use them to enhance natural food flavors is limited only by your creativity... Allspice-Meat, fish, eggs, fruit, peas, red and yellow vegetables Almond Extract-Fruit baked goods Anise Seed-Sweet breads, fruit, carrots, beets, cottage cheese, cookies (tastes like licorice) Basil-Meat, fish, eggs, vegetables, rice, vegetables salads, soups, sauces Bay Leaf-Meat, fish, stews, poultry Burnet-Salad, vegetables (cucumber-like flavor) Caraway Seed-Bread, cookies, cottage cheese, meat, vegetables, cheese, rice Cardamon-Baked goods, fruit, soups Celery Powder or seed-Salads, salad dressings, sauces, meatloaf, soup, bread.Do not use  celery salt Chervil-Meats, salads, fish, eggs, vegetables, cottage cheese (parsley-like flavor) Chili Power-Meatloaf, chicken cheese, corn, eggplant, egg dishes Chives-Salads cottage cheese, egg dishes, soups, vegetables, sauces Cilantro-Salsa, casseroles Cinnamon-Baked goods, fruit, pork, lamb, chicken, carrots Cloves-Fruit, baked goods, fish, pot roast, green beans, beets, carrots Coriander-Pastry, cookies, meat, salads, cheese (lemon-orange flavor) Cumin-Meatloaf, fish,cheese, eggs, cabbage,fruit pie (caraway flavor) [employer], fruit, eggs, fish, poultry, cottage cheese, vegetables Dill Seed-Meat, cottage cheese, poultry, vegetables, fish, salads, bread Fennel Seed-Bread, cookies, apples, pork, eggs, fish, beets, cabbage, cheese, Licorice-like flavor Garlic-(buds or powder) Salads, meat, poultry, fish, bread, butter, vegetables, potatoes.Do not  use garlic salt Ginger-Fruit, vegetables, baked goods, meat, fish, poultry Horseradish Root-Meet, vegetables, butter Lemon Juice or Extract-Vegetables, fruit, tea, baked goods, fish salads Mace-Baked goods fruit, vegetables, fish, poultry (taste like nutmeg) Maple Extract-Syrups Marjoram-Meat, chicken, fish, vegetables, breads, green salads (taste like  Sage) Mint-Tea, lamb,  sherbet, vegetables, desserts, carrots, cabbage Mustard, Dry or Seed-Cheese, eggs, meats, vegetables, poultry Nutmeg-Baked goods, fruit, chicken, eggs, vegetables, desserts Onion Powder-Meat, fish, poultry, vegetables, cheese, eggs, bread, rice salads (Do not use   Onion salt) Orange Extract-Desserts, baked goods Oregano-Pasta, eggs, cheese, onions, pork, lamb, fish, chicken, vegetables, green salads Paprika-Meat, fish, poultry, eggs, cheese, vegetables Parsley Flakes-Butter, vegetables, meat fish, poultry, eggs, bread, salads (certain forms may   Contain sodium Pepper-Meat fish, poultry, vegetables, eggs Peppermint Extract-Desserts, baked goods Poppy Seed-Eggs, bread, cheese, fruit dressings, baked goods, noodles, vegetables, cottage  [employer], poultry, meat, fish, cauliflower, turnips,eggs bread Saffron-Rice, bread, veal, chicken, fish, eggs Sage-Meat, fish, poultry, onions, eggplant, tomateos, pork, stews Savory-Eggs, salads, poultry, meat, rice, vegetables, soups, pork Tarragon-Meat, poultry, fish, eggs, butter, vegetables (licorice-like flavor)  Thyme-Meat, poultry, fish, eggs, vegetables, (clover-like flavor), sauces, soups Tumeric-Salads, butter, eggs, fish, rice, vegetables (saffron-like flavor) Vanilla Extract-Baked goods, candy Vinegar-Salads, vegetables, meat marinades Walnut Extract-baked goods, candy   2. Choose your Foods Wisely   The following is a list of foods to avoid which are high in sodium:  Meats-Avoid all smoked, canned, salt cured, dried and kosher meat and fish as well as Anchovies   [employer] meats:Bologna, Liverwurst, Pastrami Canned meat or fish  Marinated herring Caviar    Pepperoni Corned Beef   Pizza Dried chipped beef  Salami Frozen breaded fish or meat Salt pork Frankfurters or hot dogs  Sardines Gefilte fish   Sausage Ham (boiled ham, Proscuitto Smoked butt     spiced ham)   Spam      TV Dinners Vegetables Canned vegetables (Regular) Relish Canned mushrooms  Sauerkraut Olives    Tomato juice Pickles  Bakery and Dessert Products Canned puddings  Cream pies Cheesecake   Decorated cakes Cookies  Beverages/Juices Tomato juice, regular  Gatorade   V-8 vegetable juice, regular  Breads and Cereals Biscuit mixes   Salted potato chips, corn chips, pretzels Bread stuffing mixes  Salted crackers and rolls Pancake and waffle mixes Self-rising flour  Seasonings Accent    Meat sauces Barbecue sauce  Meat tenderizer Catsup    Monosodium glutamate (MSG) Celery salt   Onion salt Chili sauce   Prepared mustard Garlic salt   Salt, seasoned salt, sea salt Gravy mixes   Soy sauce Horseradish   Steak sauce Ketchup   Tartar sauce Lite salt    Teriyaki sauce Marinade mixes   Worcestershire sauce  Others Baking powder   Cocoa and cocoa mixes Baking soda   Commercial casserole mixes Candy-caramels, chocolate  Dehydrated soups    Bars, fudge,nougats  Instant rice and pasta mixes Canned broth or soup  Maraschino cherries Cheese, aged and processed cheese and cheese spreads  Learning Assessment Quiz  Indicated T (for True) or F (for False) for each of the following statements:  _____ Fresh fruits and vegetables and unprocessed grains are generally low in sodium _____ Water may contain a considerable amount of sodium, depending on the source _____ You can always tell if a food is high in sodium by tasting it _____ Certain laxatives my be high in sodium and should be avoided unless prescribed   by a physician or pharmacist _____ Salt substitutes may be used freely by anyone on a sodium restricted diet _____ Sodium is present in table salt, food additives and as a natural component of   most foods _____ Table salt is approximately 90% sodium _____ Limiting sodium intake may help prevent excess fluid  accumulation in the body _____ On a  sodium-restricted diet, seasonings such as bouillon soy sauce, and    cooking wine should be used in place of table salt _____ On an ingredient list, a product which lists monosodium glutamate as the first   ingredient is an appropriate food to include on a low sodium diet  Circle the best answer(s) to the following statements (Hint: there may be more than one correct answer)  11. On a low-sodium diet, some acceptable snack items are:    A. Olives  F. Bean dip   K. Grapefruit juice    B. Salted Pretzels G. Commercial Popcorn   L. Canned peaches    C. Carrot Sticks  H. Bouillon   M. Unsalted nuts   D. [place] fries  I. Peanut butter crackers N. Salami   E. Sweet pickles J. Tomato Juice   O. Pizza  12.  Seasonings that may be used freely on a reduced - sodium diet include   A. Lemon wedges F.Monosodium glutamate K. Celery seed    B.Soysauce   G. Pepper   L. Mustard powder   C. Sea salt  H. Cooking wine  M. Onion flakes   D. Vinegar  E. Prepared horseradish N. Salsa   E. Sage   J. Worcestershire sauce  O. Chutney

## 2022-07-20 NOTE — Telephone Encounter (Signed)
07/20/22 ADDENDUM:  Patient has apt today at 1100 hrs with M.Lenze,PA-C

## 2022-07-23 ENCOUNTER — Encounter: Payer: Self-pay | Admitting: Cardiology

## 2022-08-10 NOTE — Telephone Encounter (Signed)
Will this patient need labs prior to her upcoming appointment?

## 2022-08-11 ENCOUNTER — Other Ambulatory Visit: Payer: Self-pay | Admitting: Physician Assistant

## 2022-08-11 ENCOUNTER — Telehealth: Payer: Self-pay | Admitting: Cardiology

## 2022-08-11 NOTE — Telephone Encounter (Signed)
Did not see any telephone encounters where the patient was contacted. Informed patient that this call could be in regards to upcoming appointment. Verbalized understanding.

## 2022-08-11 NOTE — Telephone Encounter (Signed)
Patient states he was returning call. Please advise ?

## 2022-08-18 ENCOUNTER — Encounter: Payer: Self-pay | Admitting: Nurse Practitioner

## 2022-08-18 ENCOUNTER — Ambulatory Visit: Payer: Medicare HMO | Attending: Nurse Practitioner | Admitting: Nurse Practitioner

## 2022-08-18 VITALS — BP 122/76 | HR 59 | Ht 62.5 in | Wt 209.8 lb

## 2022-08-18 DIAGNOSIS — F439 Reaction to severe stress, unspecified: Secondary | ICD-10-CM | POA: Diagnosis not present

## 2022-08-18 DIAGNOSIS — I10 Essential (primary) hypertension: Secondary | ICD-10-CM | POA: Diagnosis not present

## 2022-08-18 DIAGNOSIS — E785 Hyperlipidemia, unspecified: Secondary | ICD-10-CM

## 2022-08-18 DIAGNOSIS — E669 Obesity, unspecified: Secondary | ICD-10-CM | POA: Diagnosis not present

## 2022-08-18 DIAGNOSIS — I2510 Atherosclerotic heart disease of native coronary artery without angina pectoris: Secondary | ICD-10-CM | POA: Diagnosis not present

## 2022-08-18 NOTE — Patient Instructions (Signed)

## 2022-08-18 NOTE — Progress Notes (Signed)
Cardiology Office Note:    Date:  08/18/2022   ID:  [name], DOB [birth year], MRN [id number]  PCP:  Chevis Pretty, [place] Providers Cardiologist:  Rozann Lesches, MD     Referring MD: Hassell Done, Mary-Margaret, *   CC: Here for follow-up   History of Present Illness:    [name] is a 67 y.o. female with a hx of the following:  Abnormal stress test CAD, s/p PTCA and DES to subtotal proximal LAD (04/2022) HTN HLD Stress/anxiety Obesity  Underwent stress testing 03/2022. During stress EKG, 3 mm of horizontal ST depression in inferolateral and inferior leads. Test was considered high risk, large perfusion defect that was reversible along the anterior wall, inferior wall, and septum that was consistent with evidence of multivessel CAD.  LVEF was mildly reduced at 40%, findings consistent with ischemia.  Dr. Domenic Polite suggested a cardiac catheterization. TTE revealed EF 60 to 65%, grade 1 DD. Underwent LHC 04/2022 that revealed multivessel CAD, received PCI via PTCA and DES to subtotal proximal LAD.  DAPT recommended for 12 months.  Dr. Claiborne Billings recommended that due to total RCA occlusion with collateralization via left system, and mild to moderate left Cx stenosis, medical therapy recommended.    Last seen by Ermalinda Barrios 1 month ago with CC of elevated BP. Norvasc 5 mg daily added to medication regimen.   Today she presents for follow-up. Doing well from a cardiac perspective. BP has improved. Denies any chest pain, shortness of breath, palpitations, syncope, presyncope, dizziness, orthopnea, PND, swelling or significant weight changes, acute bleeding, or claudication.    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   Chronic hoarseness     Colon cancer ([place]) 2014   Sigmoidectomy   Dysphagia    Hyperlipidemia    Hypertension    Hypertriglyceridemia     Past Surgical History:  Procedure Laterality Date   COLON RESECTION     COLONOSCOPY     04/2020   CORONARY STENT INTERVENTION N/A 04/10/2022   Procedure: CORONARY STENT INTERVENTION;  Surgeon: Troy Sine, MD;  Location: [facility];  Service: Cardiovascular;  Laterality: N/A;   KNEE SURGERY     LEFT HEART CATH AND CORONARY ANGIOGRAPHY N/A 04/10/2022   Procedure: LEFT HEART CATH AND CORONARY ANGIOGRAPHY;  Surgeon: Troy Sine, MD;  Location: [facility];  Service: Cardiovascular;  Laterality: N/A;   oral surgery tooth extraction     TONSILLECTOMY     TOTAL ABDOMINAL HYSTERECTOMY     VULVECTOMY PARTIAL Bilateral 03/30/2018   Procedure: LASER PARTIAL VULVECTOMY FOR VULVUL INTRAEPITHELIA NEOPLASIS III;  Surgeon: Florian Buff, MD;  Location: [facility];  Service: Gynecology;  Laterality: Bilateral;   Current Meds  Medication Sig   acetaminophen (TYLENOL) 500 MG tablet Take 500 mg by mouth every 6 (six) hours as needed for moderate pain.   ALPRAZolam (XANAX) 0.5 MG tablet Take 1 tablet (0.5 mg total) by mouth 2 (two) times daily.   amLODipine (NORVASC) 5 MG tablet Take 1 tablet (5 mg total) by mouth daily.   aspirin EC 81 MG tablet Take 1 tablet (81 mg total) by mouth daily. Swallow whole.   ezetimibe (ZETIA) 10  MG tablet Take 1 tablet (10 mg total) by mouth daily.   fenofibrate 160 MG tablet Take 1 tablet (160 mg total) by mouth daily.   hydrocortisone cream 1 % Apply 1 Application topically 2 (two) times daily as needed for itching.   isosorbide mononitrate (IMDUR) 30 MG 24 hr tablet Take 1 tablet (30 mg total) by mouth daily.   levothyroxine (SYNTHROID) 75 MCG tablet TAKE 1 TABLET EVERY DAY BEFORE BREAKFAST   losartan (COZAAR) 100 MG tablet Take 1 tablet (100 mg total) by mouth daily.   metoprolol tartrate (LOPRESSOR) 25 MG tablet TAKE 0.5 TABLET  (12.5 MG TOTAL) BY MOUTH 2 TIMES DAILY.   nitroGLYCERIN (NITROSTAT) 0.4 MG SL tablet Place 1 tablet (0.4 mg total) under the tongue every 5 (five) minutes as needed for chest pain.   rosuvastatin (CRESTOR) 10 MG tablet TAKE 1 TABLET BY MOUTH EVERY DAY   sodium fluoride (DENTAGEL) 1.1 % GEL dental gel APPLY 1 APPLICATION ONTO TEETH AT BEDTIME. (Patient taking differently: Place 1 Application onto teeth 4 (four) times a week.)   ticagrelor (BRILINTA) 90 MG TABS tablet Take 1 tablet (90 mg total) by mouth 2 (two) times daily.   Allergies:   Azithromycin, Lisinopril, and Sulfa antibiotics   Social History   Socioeconomic History   Marital status: Married    Spouse name: Not on file   Number of children: 1   Years of education: Not on file   Highest education level: Not on file  Occupational History    Employer: [employer]  Tobacco Use   Smoking status: Some Days    Packs/day: 0.25    Years: 40.00    Additional pack years: 0.00    Total pack years: 10.00    Types: Cigarettes    Passive exposure: Never   Smokeless tobacco: Never  Vaping Use   Vaping Use: Never used  Substance and Sexual Activity   Alcohol use: Not Currently   Drug use: No   Sexual activity: Not Currently    Birth control/protection: Surgical    Comment: hyst  Other Topics Concern   Not on file  Social History Narrative   Not on file   Social Determinants of Health   Financial Resource Strain: Not on file  Food Insecurity: No Food Insecurity (04/10/2022)   Hunger Vital Sign    Worried About Running Out of Food in the Last Year: Never true    Ran Out of Food in the Last Year: Never true  Transportation Needs: No Transportation Needs (04/10/2022)   PRAPARE - Hydrologist (Medical): No    Lack of Transportation (Non-Medical): No  Physical Activity: Not on file  Stress: Not on file  Social Connections: Not on file     Family History: The patient's family history  includes Cancer in her maternal grandfather; Cancer (age of onset: 46) in her maternal aunt; Cancer (age of onset: 43) in her mother; Heart attack in her father; Heart failure in her father; Mental illness in her daughter.  ROS:     Please see the history of present illness.    All other systems reviewed and are negative.  EKGs/Labs/Other Studies Reviewed:    The following studies were reviewed today:   EKG:  EKG is not ordered today.   Left heart cath on April 10, 2022:   1st Mrg lesion is 50% stenosed.   Prox LAD to Mid LAD lesion is 95% stenosed.   Prox RCA  to Mid RCA lesion is 100% stenosed.   RPDA lesion is 70% stenosed.   Mid LAD lesion is 30% stenosed.   Mid Cx lesion is 30% stenosed.   Prox LAD lesion is 30% stenosed.   A drug-eluting stent was successfully placed.   Post intervention, there is a 0% residual stenosis.   Post intervention, there is a 0% residual stenosis.   Multivessel CAD with 95 to 99% proximal LAD stenosis with mild calcification superiorly and 30% mid stenosis; 50% ostial stenosis in a small high marginal vessel of the circumflex with 30% mid AV groove stenosis; and superior takeoff RCA with 70% proximal stenosis followed by total occlusion after the RV marginal branch.  There is extensive collateralization to the distal PDA and PLA vessel from the left coronary injection with visualization of 70% ostial PDA stenosis.   LVEDP 17 mmHg.   Successful percutaneous coronary intervention to the subtotal proximal LAD stenosis treated with initial PTCA using a 2.5 x 12 mm balloon, due to the calcification superiorly a 2.5 x 10 mm core flex balloon, and ultimate insertion of a 3.5 x 15 mm Medtronic Onyx Frontier stent postdilated to 3.71 mm the stenosis being reduced to 0%.   RECOMMENDATION: DAPT with aspirin/Brilinta for minimum of 12 months.  With total RCA occlusion with collateralization via the left system and mild to moderate left circumflex stenoses,  plan initial medical therapy for concomitant CAD.  Will initiate beta-blocker therapy, and continue nitrates.  Depending upon blood pressure amlodipine can be added in the future.  The patient apparently did not tolerate predominantly atorvastaton; will try rechallenge statin therapy with low-dose rosuvastatin.  The patient had recently been started on Zetia and is awaiting to start bempedoic acid pending insurance approval.  May need future  PCSK9 inhibition.  We will plan to keep in hospital overnight with medication initiation and with her home 45 minutes away with plans for discharge tomorrow.    2D complete echocardiogram on April 08, 2022:  1. Left ventricular ejection fraction, by estimation, is 60 to 65%. The  left ventricle has normal function. The left ventricle has no regional  wall motion abnormalities. Left ventricular diastolic parameters are  consistent with Grade I diastolic  dysfunction (impaired relaxation).   2. Right ventricular systolic function is normal. The right ventricular  size is normal.   3. The mitral valve is normal in structure. No evidence of mitral valve  regurgitation. No evidence of mitral stenosis.   4. The aortic valve is tricuspid. Aortic valve regurgitation is not  visualized. No aortic stenosis is present.   5. The inferior vena cava is normal in size with greater than 50%  respiratory variability, suggesting right atrial pressure of 3 mmHg.   Comparison(s): Nuclear stress test done 03/31/22 showed an EF of 48%.  Lexiscan on March 31, 2022:   Findings are consistent with ischemia. The study is high risk.   3.0 mm of horizontal ST depression in the inferior and inferolateral leads (II, III, aVF, V5 and V6) was noted. The ECG was positive for ischemia.  Intermediate risk Duke treadmill score of -11.   LV perfusion is abnormal.  Large, severe intensity, reversible defect from apex to base involving the anterior wall, septum, and inferior wall  consistent with multivessel distribution obstructive CAD.   Left ventricular function is abnormal. Nuclear stress EF: 48 %. The left ventricular ejection fraction is mildly decreased (45-54%). End diastolic cavity size is normal. End systolic cavity size is normal.  Image quality is adequate.   High risk study with large perfusion defects that are reversible within the anterior wall, septum, and inferior wall consistent with multivessel distribution CAD.  LVEF mildly reduced at 48%.  No obvious transient ischemic dilatation noted.  ECG also showed significant ST segment depression consistent with ischemia.     Recent Labs: 01/08/2022: TSH 3.860 06/08/2022: ALT 25; BUN 23; Creatinine, Ser 1.22; Hemoglobin 14.2; Platelets 212; Potassium 4.5; Sodium 140  Recent Lipid Panel    Component Value Date/Time   CHOL 122 06/08/2022 1138   TRIG 129 06/08/2022 1138   TRIG 344 (H) 05/28/2014 1343   HDL 43 06/08/2022 1138   HDL 42 05/28/2014 1343   CHOLHDL 2.8 06/08/2022 1138   LDLCALC 56 06/08/2022 1138    Physical Exam:    VS:  BP 122/76 (BP Location: Left Arm, Patient Position: Sitting, Cuff Size: Large)   Pulse (!) 59   Ht 5' 2.5" (1.588 m)   Wt 209 lb 12.8 oz (95.2 kg)   SpO2 98%   BMI 37.76 kg/m     Wt Readings from Last 3 Encounters:  08/18/22 209 lb 12.8 oz (95.2 kg)  07/20/22 211 lb 3.2 oz (95.8 kg)  06/08/22 204 lb (92.5 kg)     GEN: Obese, 67 y.o. Caucasian female in no acute distress NECK: No JVD; No carotid bruits CARDIAC: S1/S2, RRR, no murmurs, rubs, gallops; 2+ peripheral pulses throughout RESPIRATORY:  Clear and diminished to auscultation without rales, wheezing or rhonchi  MUSCULOSKELETAL:  No edema; No deformity  SKIN: Warm and dry NEUROLOGIC:  Alert and oriented x 3 PSYCHIATRIC:  Pleasant, nervous affect   ASSESSMENT:    1. Coronary artery disease involving native heart without angina pectoris, unspecified vessel or lesion type   2. Hyperlipidemia, unspecified  hyperlipidemia type   3. Essential hypertension, benign   4. Stress   5. Obesity (BMI 30-39.9)     PLAN:    In order of problems listed above:  CAD, s/p PTCA with DES to subtotal proximal LAD Stable with no anginal symptoms. No indication for ischemic evaluation. Tolerating medications well. Continue ASA, Zetia, Fenofibrate, Imdur, Losartan, Lopressor, Rosuvastatin, and Brilinta.  Heart healthy diet and regular cardiovascular exercise encouraged.   2. Hyperlipidemia Recent labs WNL.  Continue current medication regimen. Heart healthy diet and regular cardiovascular exercise encouraged.   3. Hypertension Blood pressure stable. Discussed to monitor BP at home at least 2 hours after medications and sitting for 5-10 minutes.  Continue current medication regimen.   4.  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  Previously offered Education officer, museum referral including resources, she politely declined. Continue to follow with PCP.  5. Obesity BMI 37.76. Weight loss via diet and exercise encouraged. Discussed the impact being overweight would have on cardiovascular risk. Heart healthy diet and regular cardiovascular exercise encouraged.   6. Disposition: Follow-up with Dr. Domenic Polite or APP in 6 months or sooner if anything changes.    Medication Adjustments/Labs and Tests Ordered: Current medicines are reviewed at length with the patient today.  Concerns regarding medicines are outlined above.  No orders of the defined types were placed in this encounter.  No orders of the defined types were placed in this encounter.   Patient Instructions  Medication Instructions:  Your physician recommends that you continue on your current medications as directed. Please refer to the Current Medication list given to you today.   Labwork: none  Testing/Procedures: none  Follow-Up:  Your  physician recommends that you schedule a follow-up appointment in: 6 months  Any Other  Special Instructions Will Be Listed Below (If Applicable).  If you need a refill on your cardiac medications before your next appointment, please call your pharmacy.    SignedFinis Bud, NP  08/18/2022 9:54 AM    [place]

## 2022-08-19 DIAGNOSIS — D225 Melanocytic nevi of trunk: Secondary | ICD-10-CM | POA: Diagnosis not present

## 2022-08-19 DIAGNOSIS — X32XXXA Exposure to sunlight, initial encounter: Secondary | ICD-10-CM | POA: Diagnosis not present

## 2022-08-19 DIAGNOSIS — Z1283 Encounter for screening for malignant neoplasm of skin: Secondary | ICD-10-CM | POA: Diagnosis not present

## 2022-08-19 DIAGNOSIS — L719 Rosacea, unspecified: Secondary | ICD-10-CM | POA: Diagnosis not present

## 2022-08-19 DIAGNOSIS — L570 Actinic keratosis: Secondary | ICD-10-CM | POA: Diagnosis not present

## 2022-09-10 ENCOUNTER — Telehealth (INDEPENDENT_AMBULATORY_CARE_PROVIDER_SITE_OTHER): Payer: Medicare HMO | Admitting: Nurse Practitioner

## 2022-09-10 ENCOUNTER — Encounter: Payer: Self-pay | Admitting: Nurse Practitioner

## 2022-09-10 DIAGNOSIS — L989 Disorder of the skin and subcutaneous tissue, unspecified: Secondary | ICD-10-CM | POA: Diagnosis not present

## 2022-09-10 NOTE — Progress Notes (Signed)
Virtual Visit Consent   [name], you are scheduled for a virtual visit with Shari Daphine Deutscher, FNP, a [facility] provider, [name].     Just as with appointments in the office, your consent must be obtained to participate.  Your consent will be active for this visit and any virtual visit you may have with one of our providers in the next 365 days.     If you have a MyChart account, a copy of this consent can be sent to you electronically.  All virtual visits are billed to your insurance company just like a traditional visit in the office.    As this is a virtual visit, video technology does not allow for your provider to perform a traditional examination.  This may limit your provider's ability to fully assess your condition.  If your provider identifies any concerns that need to be evaluated in person or the need to arrange testing (such as labs, EKG, etc.), we will make arrangements to do so.     Although advances in technology are sophisticated, we cannot ensure that it will always work on either your end or our end.  If the connection with a video visit is poor, the visit may have to be switched to a telephone visit.  With either a video or telephone visit, we are not always able to ensure that we have a secure connection.     I need to obtain your verbal consent now.   Are you willing to proceed with your visit [name]? YES   [name] has provided verbal consent on 09/10/2022 for a virtual visit (video or telephone).   Shari Daphine Deutscher, FNP   Date: 09/10/2022 9:56 AM   Virtual Visit via Video Note   I, [name], connected with [name] ([id number], 1955/10/30) on 09/10/22 at  4:45 PM EDT by a video-enabled telemedicine application and verified that I am speaking with the correct person using two identifiers.  Location: Patient: Virtual Visit Location Patient: Home Provider: Virtual Visit Location Provider: Mobile   I discussed the limitations of  evaluation and management by telemedicine and the availability of in person appointments. The patient expressed understanding and agreed to proceed.    History of Present Illness: [name] is a 67 y.o. who identifies as a female who was assigned female at birth, and is being seen [name] for facial lesion .  HPI: Patient wants referral to dermatology due  to lesion on her nose. Saw dermatology on 08/19/22, he was not sure if had skin cancer on nose or not. Now she has a place on eyebrow that she wants looked at.     Review of Systems  Constitutional:  Negative for diaphoresis and weight loss.  Eyes:  Negative for blurred vision, double vision and pain.  Respiratory:  Negative for shortness of breath.   Cardiovascular:  Negative for chest pain, palpitations, orthopnea and leg swelling.  Gastrointestinal:  Negative for abdominal pain.  Skin:  Negative for rash.  Neurological:  Negative for dizziness, sensory change, loss of consciousness, weakness and headaches.  Endo/Heme/Allergies:  Negative for polydipsia. Does not bruise/bleed easily.  Psychiatric/Behavioral:  Negative for memory loss. The patient does not have insomnia.   All other systems reviewed and are negative.   Problems:  Patient Active Problem List   Diagnosis Date Noted   Angina pectoris 04/10/2022   Abnormal nuclear stress test 04/10/2022   CAD S/P percutaneous coronary angioplasty 04/10/2022   Positive cardiac  stress test 04/03/2022   Statin intolerance 04/03/2022   Refusal of statin medication by patient 12/07/2019   Myalgia due to statin 12/01/2019   Diabetes 08/18/2018   Acquired hypothyroidism 01/15/2017   BMI 33.0-33.9,adult 05/30/2015   Hyperlipidemia LDL goal <70 12/13/2014   Essential hypertension 05/28/2014   Depression 09/18/2013   Panic attacks 09/18/2013   Insomnia 09/18/2013   History of tobacco use 03/24/2013   Squamous cell carcinoma of supraglottis 03/31/2012   Anxiety 03/09/2012   Malignant  neoplasm 02/25/2012    Allergies:  Allergies  Allergen Reactions   Azithromycin Diarrhea   Lisinopril Cough   Sulfa Antibiotics Other (See Comments)    Unable to recall   Medications:  Current Outpatient Medications:    acetaminophen (TYLENOL) 500 MG tablet, Take 500 mg by mouth every 6 (six) hours as needed for moderate pain., Disp: , Rfl:    ALPRAZolam (XANAX) 0.5 MG tablet, Take 1 tablet (0.5 mg total) by mouth 2 (two) times daily., Disp: 180 tablet, Rfl: 1   amLODipine (NORVASC) 5 MG tablet, Take 1 tablet (5 mg total) by mouth daily., Disp: 90 tablet, Rfl: 3   amLODipine (NORVASC) 5 MG tablet, Take 1 tablet (5 mg total) by mouth daily., Disp: 30 tablet, Rfl: 0   aspirin EC 81 MG tablet, Take 1 tablet (81 mg total) by mouth daily. Swallow whole., Disp: 90 tablet, Rfl: 3   ezetimibe (ZETIA) 10 MG tablet, Take 1 tablet (10 mg total) by mouth daily., Disp: 90 tablet, Rfl: 3   fenofibrate 160 MG tablet, Take 1 tablet (160 mg total) by mouth daily., Disp: 90 tablet, Rfl: 1   hydrocortisone cream 1 %, Apply 1 Application topically 2 (two) times daily as needed for itching., Disp: , Rfl:    isosorbide mononitrate (IMDUR) 30 MG 24 hr tablet, Take 1 tablet (30 mg total) by mouth daily., Disp: 90 tablet, Rfl: 3   levothyroxine (SYNTHROID) 75 MCG tablet, TAKE 1 TABLET EVERY DAY BEFORE BREAKFAST, Disp: 90 tablet, Rfl: 1   losartan (COZAAR) 100 MG tablet, Take 1 tablet (100 mg total) by mouth daily., Disp: 90 tablet, Rfl: 1   metoprolol tartrate (LOPRESSOR) 25 MG tablet, TAKE 0.5 TABLET (12.5 MG TOTAL) BY MOUTH 2 TIMES DAILY., Disp: 90 tablet, Rfl: 1   nitroGLYCERIN (NITROSTAT) 0.4 MG SL tablet, Place 1 tablet (0.4 mg total) under the tongue every 5 (five) minutes as needed for chest pain., Disp: 20 tablet, Rfl: 2   rosuvastatin (CRESTOR) 10 MG tablet, TAKE 1 TABLET BY MOUTH EVERY DAY, Disp: 90 tablet, Rfl: 3   sodium fluoride (DENTAGEL) 1.1 % GEL dental gel, APPLY 1 APPLICATION ONTO TEETH AT  BEDTIME. (Patient taking differently: Place 1 Application onto teeth 4 (four) times a week.), Disp: 112 g, Rfl: 5   ticagrelor (BRILINTA) 90 MG TABS tablet, Take 1 tablet (90 mg total) by mouth 2 (two) times daily., Disp: 180 tablet, Rfl: 3  Observations/Objective: Patient is well-developed, well-nourished in no acute distress.  Resting comfortably  at home.  Head is normocephalic, atraumatic.  No labored breathing.  Speech is clear and coherent with logical content.  Patient is alert and oriented at baseline.  Lesion on right eyebrow.  Assessment and Plan:  [name] with chief complaint of facial lesion  1. Facial lesion Referral made to dermatology  Orders Placed This Encounter  Procedures   Ambulatory referral to Dermatology    Referral Priority:   Routine    Referral Type:  Consultation    Referral Reason:   Specialty Services Required    Requested Specialty:   Dermatology    Number of Visits Requested:   1    Follow Up Instructions: I discussed the assessment and treatment plan with the patient. The patient was provided an opportunity to ask questions and all were answered. The patient agreed with the plan and demonstrated an understanding of the instructions.  A copy of instructions were sent to the patient via MyChart.  The patient was advised to call back or seek an in-person evaluation if the symptoms worsen or if the condition fails to improve as anticipated.  Time:  I spent 7 minutes with the patient via telehealth technology discussing the above problems/concerns.    Shari Daphine DeutscherMartin, FNP

## 2022-09-10 NOTE — Patient Instructions (Signed)
[name], thank you for joining Bennie Pierini, FNP for today's virtual visit.  While this provider is not your primary care provider (PCP), if your PCP is located in our provider database this encounter information will be shared with them immediately following your visit.   A [facility] MyChart account gives you access to today's visit and all your visits, tests, and labs performed at [facility] " click here if you don't have a [facility] MyChart account or go to [email or web address]  Consent: (Patient) [name] provided verbal consent for this virtual visit at the beginning of the encounter.  Current Medications:  Current Outpatient Medications:    acetaminophen (TYLENOL) 500 MG tablet, Take 500 mg by mouth every 6 (six) hours as needed for moderate pain., Disp: , Rfl:    ALPRAZolam (XANAX) 0.5 MG tablet, Take 1 tablet (0.5 mg total) by mouth 2 (two) times daily., Disp: 180 tablet, Rfl: 1   amLODipine (NORVASC) 5 MG tablet, Take 1 tablet (5 mg total) by mouth daily., Disp: 90 tablet, Rfl: 3   amLODipine (NORVASC) 5 MG tablet, Take 1 tablet (5 mg total) by mouth daily., Disp: 30 tablet, Rfl: 0   aspirin EC 81 MG tablet, Take 1 tablet (81 mg total) by mouth daily. Swallow whole., Disp: 90 tablet, Rfl: 3   ezetimibe (ZETIA) 10 MG tablet, Take 1 tablet (10 mg total) by mouth daily., Disp: 90 tablet, Rfl: 3   fenofibrate 160 MG tablet, Take 1 tablet (160 mg total) by mouth daily., Disp: 90 tablet, Rfl: 1   hydrocortisone cream 1 %, Apply 1 Application topically 2 (two) times daily as needed for itching., Disp: , Rfl:    isosorbide mononitrate (IMDUR) 30 MG 24 hr tablet, Take 1 tablet (30 mg total) by mouth daily., Disp: 90 tablet, Rfl: 3   levothyroxine (SYNTHROID) 75 MCG tablet, TAKE 1 TABLET EVERY DAY BEFORE BREAKFAST, Disp: 90 tablet, Rfl: 1   losartan (COZAAR) 100 MG tablet, Take 1 tablet (100 mg total) by mouth daily., Disp: 90 tablet, Rfl: 1   metoprolol  tartrate (LOPRESSOR) 25 MG tablet, TAKE 0.5 TABLET (12.5 MG TOTAL) BY MOUTH 2 TIMES DAILY., Disp: 90 tablet, Rfl: 1   nitroGLYCERIN (NITROSTAT) 0.4 MG SL tablet, Place 1 tablet (0.4 mg total) under the tongue every 5 (five) minutes as needed for chest pain., Disp: 20 tablet, Rfl: 2   rosuvastatin (CRESTOR) 10 MG tablet, TAKE 1 TABLET BY MOUTH EVERY DAY, Disp: 90 tablet, Rfl: 3   sodium fluoride (DENTAGEL) 1.1 % GEL dental gel, APPLY 1 APPLICATION ONTO TEETH AT BEDTIME. (Patient taking differently: Place 1 Application onto teeth 4 (four) times a week.), Disp: 112 g, Rfl: 5   ticagrelor (BRILINTA) 90 MG TABS tablet, Take 1 tablet (90 mg total) by mouth 2 (two) times daily., Disp: 180 tablet, Rfl: 3   Medications ordered in this encounter:  No orders of the defined types were placed in this encounter.    *If you need refills on other medications prior to your next appointment, please contact your pharmacy*  Follow-Up: Call back or seek an in-person evaluation if the symptoms worsen or if the condition fails to improve as anticipated.  [facility] [phone]  Other Instructions    If you have been instructed to have an in-person evaluation today at a local Urgent Care facility, please use the link below. It will take you to a list of all of our available [facility] Urgent Cares,  including address, phone number and hours of operation. Please do not delay care.  [facility]  If you or a family member do not have a primary care provider, use the link below to schedule a visit and establish care. When you choose a [facility] primary care physician or advanced practice provider, you gain a long-term partner in health. Find a Primary Care Provider  Learn more about [facility]'s in-office and virtual care options: [facility]

## 2022-09-16 DIAGNOSIS — L02421 Furuncle of right axilla: Secondary | ICD-10-CM | POA: Diagnosis not present

## 2022-09-16 DIAGNOSIS — B078 Other viral warts: Secondary | ICD-10-CM | POA: Diagnosis not present

## 2022-09-16 DIAGNOSIS — D485 Neoplasm of uncertain behavior of skin: Secondary | ICD-10-CM | POA: Diagnosis not present

## 2022-09-16 DIAGNOSIS — L03111 Cellulitis of right axilla: Secondary | ICD-10-CM | POA: Diagnosis not present

## 2022-10-27 ENCOUNTER — Encounter: Payer: Self-pay | Admitting: Family Medicine

## 2022-10-27 ENCOUNTER — Ambulatory Visit (INDEPENDENT_AMBULATORY_CARE_PROVIDER_SITE_OTHER): Payer: Medicare HMO | Admitting: Family Medicine

## 2022-10-27 VITALS — BP 158/93 | HR 69 | Temp 97.5°F | Ht 62.5 in | Wt 217.0 lb

## 2022-10-27 DIAGNOSIS — H5789 Other specified disorders of eye and adnexa: Secondary | ICD-10-CM | POA: Diagnosis not present

## 2022-10-27 DIAGNOSIS — I10 Essential (primary) hypertension: Secondary | ICD-10-CM | POA: Diagnosis not present

## 2022-10-27 MED ORDER — POLYMYXIN B-TRIMETHOPRIM 10000-0.1 UNIT/ML-% OP SOLN
2.0000 [drp] | Freq: Four times a day (QID) | OPHTHALMIC | 0 refills | Status: AC
Start: 2022-10-27 — End: 2022-11-01

## 2022-10-27 NOTE — Progress Notes (Signed)
Acute Office Visit  Subjective:  Patient ID: [name], female    DOB: 1956-02-25, 67 y.o.   MRN: [id number]  Chief Complaint  Patient presents with   Conjunctivitis    Both eyes, red, itching, draining    HPI Patient is in today for eye irritation. States that it started 9 days ago. She is having to wipe away "gunk" in the morning. States that they are running throughout the day. She has tried washing them out in the shower. She reports that she has a new cool mist humidifier and it was blowing in her face for multiple days. She stopped using this last week. She has not tried any eye drops. States that it itches rarely, "not enough to make her scratch". Denies fever, denies pain. Endorses cough at night when she lies down. Started in her right eye and then progressed to her left eye after a few days. Denies other symptoms of runny nose, sneezing.   ROS As per HPI Objective:  BP (!) 158/93   Pulse 69   Temp (!) 97.5 F (36.4 C)   Ht 5' 2.5" (1.588 m)   Wt 217 lb (98.4 kg)   SpO2 97%   BMI 39.06 kg/m   Physical Exam Constitutional:      General: She is awake. She is not in acute distress.    Appearance: Normal appearance. She is well-developed and well-groomed. She is not ill-appearing, toxic-appearing or diaphoretic.  HENT:     Right Ear: A middle ear effusion is present. There is no impacted cerumen. No foreign body. No mastoid tenderness. Tympanic membrane is not injected, scarred, perforated, erythematous or bulging.     Left Ear: A middle ear effusion is present. There is no impacted cerumen. No foreign body. No mastoid tenderness. Tympanic membrane is not injected, scarred, perforated, erythematous or bulging.     Nose:     Right Sinus: No maxillary sinus tenderness or frontal sinus tenderness.     Left Sinus: No maxillary sinus tenderness or frontal sinus tenderness.     Mouth/Throat:     Lips: Pink.     Mouth: Mucous membranes are moist. No injury, lacerations,  oral lesions or angioedema.     Tongue: No lesions. Tongue does not deviate from midline.  Eyes:     General:        Right eye: Discharge present. No foreign body or hordeolum.        Left eye: Discharge present.No foreign body or hordeolum.     Extraocular Movements:     Right eye: No nystagmus.     Left eye: No nystagmus.     Conjunctiva/sclera:     Right eye: Right conjunctiva is injected. Chemosis and exudate present. No hemorrhage.    Left eye: Left conjunctiva is injected. Chemosis and exudate present. No hemorrhage.    Comments: Crusting present at bilateral inner canthus   Cardiovascular:     Rate and Rhythm: Normal rate.     Pulses: Normal pulses.          Radial pulses are 2+ on the right side and 2+ on the left side.       Posterior tibial pulses are 2+ on the right side and 2+ on the left side.     Heart sounds: Normal heart sounds. No murmur heard.    No gallop.  Pulmonary:     Effort: Pulmonary effort is normal. No respiratory distress.     Breath sounds: Normal breath  sounds. No stridor. No wheezing, rhonchi or rales.  Musculoskeletal:     Cervical back: Full passive range of motion without pain and neck supple.     Right lower leg: No edema.     Left lower leg: No edema.  Skin:    General: Skin is warm.     Capillary Refill: Capillary refill takes less than 2 seconds.  Neurological:     General: No focal deficit present.     Mental Status: She is alert, oriented to person, place, and time and easily aroused. Mental status is at baseline.     GCS: GCS eye subscore is 4. GCS verbal subscore is 5. GCS motor subscore is 6.     Motor: No weakness.  Psychiatric:        Attention and Perception: Attention and perception normal.        Mood and Affect: Mood and affect normal.        Speech: Speech normal.        Behavior: Behavior normal. Behavior is cooperative.        Thought Content: Thought content normal. Thought content does not include homicidal or suicidal  ideation. Thought content does not include homicidal or suicidal plan.        Cognition and Memory: Cognition and memory normal.        Judgment: Judgment normal.     Assessment & Plan:  1. Eye irritation Will treat as below for suspected bacterial conjunctivitis. Discussed with patient etiology and risks and benefits of treatment.  - trimethoprim-polymyxin b (POLYTRIM) ophthalmic solution; Place 2 drops into both eyes every 6 (six) hours for 5 days.  Dispense: 10 mL; Refill: 0  2. Essential hypertension Elevated BP in office. Instructed patient to monitor BP at home. Patient has monitor. She is currently being treated by PCP and cardiology. Instructed patient to follow up with both with her BP measurements.   The above assessment and management plan was discussed with the patient. The patient verbalized understanding of and has agreed to the management plan using shared-decision making. Patient is aware to call the clinic if they develop any new symptoms or if symptoms fail to improve or worsen. Patient is aware when to return to the clinic for a follow-up visit. Patient educated on when it is appropriate to go to the emergency department.   Return if symptoms worsen or fail to improve.  Neale Burly, DNP-FNP [facility] [address] [place], [place] [address] [phone]

## 2022-11-04 ENCOUNTER — Encounter: Payer: Self-pay | Admitting: Cardiology

## 2022-11-11 ENCOUNTER — Encounter: Payer: Self-pay | Admitting: Family Medicine

## 2022-11-11 ENCOUNTER — Ambulatory Visit (INDEPENDENT_AMBULATORY_CARE_PROVIDER_SITE_OTHER): Payer: Medicare HMO | Admitting: Family Medicine

## 2022-11-11 VITALS — BP 159/87 | HR 85 | Temp 97.7°F | Ht 62.5 in | Wt 217.0 lb

## 2022-11-11 DIAGNOSIS — J441 Chronic obstructive pulmonary disease with (acute) exacerbation: Secondary | ICD-10-CM | POA: Diagnosis not present

## 2022-11-11 MED ORDER — BETAMETHASONE SOD PHOS & ACET 6 (3-3) MG/ML IJ SUSP
6.0000 mg | Freq: Once | INTRAMUSCULAR | Status: AC
Start: 2022-11-11 — End: 2022-11-11
  Administered 2022-11-11: 6 mg via INTRAMUSCULAR

## 2022-11-11 MED ORDER — MOXIFLOXACIN HCL 400 MG PO TABS: 400.0000 mg | ORAL_TABLET | Freq: Every day | ORAL | 0 refills | Status: DC

## 2022-11-11 MED ORDER — BUDESONIDE-FORMOTEROL FUMARATE 160-4.5 MCG/ACT IN AERO: 2.0000 | INHALATION_SPRAY | Freq: Two times a day (BID) | RESPIRATORY_TRACT | 5 refills | Status: DC

## 2022-11-11 NOTE — Progress Notes (Signed)
Chief Complaint  Patient presents with   Cough    Been going weeks gets sob with it     HPI  Patient presents today for Patient presents with upper respiratory congestion. Rhinorrhea that is frequently purulent. There is no sore throat. Patient reports coughing frequently as well.  Thick, white sputum. A littl green sputum noted. There is no fever, chills or sweats. The patient reports being short of breath if she tries to do anything. Onset was 2 weeks  ago. Gradually worsening. Tried OTCs without improvement.  PMH: Smoking status noted ROS: Per HPI  Objective: BP (!) 159/87   Pulse 85   Temp 97.7 F (36.5 C) (Temporal)   Ht 5' 2.5" (1.588 m)   Wt 217 lb (98.4 kg)   SpO2 94%   BMI 39.06 kg/m  Gen: NAD, alert, cooperative with exam HEENT: NCAT, Nasal passages swollen, red TMS RED CV: RRR, good S1/S2, no murmur Resp: Bronchitis changes with scattered wheezes, non-labored Ext: No edema, warm Neuro: Alert and oriented, No gross deficits  Assessment and plan:  1. Acute exacerbation of chronic obstructive pulmonary disease (COPD) (HCC)     Meds ordered this encounter  Medications   budesonide-formoterol (SYMBICORT) 160-4.5 MCG/ACT inhaler    Sig: Inhale 2 puffs into the lungs 2 (two) times daily.    Dispense:  1 each    Refill:  5   DISCONTD: moxifloxacin (AVELOX) 400 MG tablet    Sig: Take 1 tablet (400 mg total) by mouth daily.    Dispense:  10 tablet    Refill:  0   betamethasone acetate-betamethasone sodium phosphate (CELESTONE) injection 6 mg    No orders of the defined types were placed in this encounter.   Follow up as needed.  Mechele Claude, MD

## 2022-11-12 ENCOUNTER — Other Ambulatory Visit: Payer: Self-pay | Admitting: Family Medicine

## 2022-11-12 MED ORDER — AMOXICILLIN 875 MG PO TABS: 875.0000 mg | ORAL_TABLET | Freq: Two times a day (BID) | ORAL | 0 refills | Status: AC

## 2022-11-14 ENCOUNTER — Encounter: Payer: Self-pay | Admitting: Family Medicine

## 2022-12-07 ENCOUNTER — Ambulatory Visit (INDEPENDENT_AMBULATORY_CARE_PROVIDER_SITE_OTHER): Payer: Medicare HMO | Admitting: Nurse Practitioner

## 2022-12-07 ENCOUNTER — Encounter: Payer: Self-pay | Admitting: Nurse Practitioner

## 2022-12-07 VITALS — BP 104/71 | HR 70 | Temp 96.1°F | Resp 20 | Ht 62.0 in | Wt 213.0 lb

## 2022-12-07 DIAGNOSIS — I10 Essential (primary) hypertension: Secondary | ICD-10-CM | POA: Diagnosis not present

## 2022-12-07 DIAGNOSIS — E039 Hypothyroidism, unspecified: Secondary | ICD-10-CM

## 2022-12-07 DIAGNOSIS — I209 Angina pectoris, unspecified: Secondary | ICD-10-CM

## 2022-12-07 DIAGNOSIS — I2510 Atherosclerotic heart disease of native coronary artery without angina pectoris: Secondary | ICD-10-CM

## 2022-12-07 DIAGNOSIS — E119 Type 2 diabetes mellitus without complications: Secondary | ICD-10-CM | POA: Diagnosis not present

## 2022-12-07 DIAGNOSIS — F419 Anxiety disorder, unspecified: Secondary | ICD-10-CM

## 2022-12-07 DIAGNOSIS — F410 Panic disorder [episodic paroxysmal anxiety] without agoraphobia: Secondary | ICD-10-CM

## 2022-12-07 DIAGNOSIS — Z9861 Coronary angioplasty status: Secondary | ICD-10-CM

## 2022-12-07 DIAGNOSIS — F3341 Major depressive disorder, recurrent, in partial remission: Secondary | ICD-10-CM | POA: Diagnosis not present

## 2022-12-07 DIAGNOSIS — E785 Hyperlipidemia, unspecified: Secondary | ICD-10-CM

## 2022-12-07 DIAGNOSIS — F5101 Primary insomnia: Secondary | ICD-10-CM | POA: Diagnosis not present

## 2022-12-07 DIAGNOSIS — C321 Malignant neoplasm of supraglottis: Secondary | ICD-10-CM | POA: Diagnosis not present

## 2022-12-07 DIAGNOSIS — Z6833 Body mass index (BMI) 33.0-33.9, adult: Secondary | ICD-10-CM

## 2022-12-07 MED ORDER — LOSARTAN POTASSIUM 100 MG PO TABS: 100.0000 mg | ORAL_TABLET | Freq: Every day | ORAL | 1 refills | Status: DC

## 2022-12-07 MED ORDER — LEVOTHYROXINE SODIUM 75 MCG PO TABS
ORAL_TABLET | ORAL | 1 refills | Status: DC
Start: 2022-12-07 — End: 2023-06-11

## 2022-12-07 MED ORDER — FENOFIBRATE 160 MG PO TABS
160.0000 mg | ORAL_TABLET | Freq: Every day | ORAL | 1 refills | Status: DC
Start: 2022-12-07 — End: 2023-06-11

## 2022-12-07 MED ORDER — ALPRAZOLAM 0.5 MG PO TABS
0.5000 mg | ORAL_TABLET | Freq: Two times a day (BID) | ORAL | 1 refills | Status: DC
Start: 2022-12-07 — End: 2023-06-11

## 2022-12-07 NOTE — Progress Notes (Signed)
Subjective:    Patient ID: [name], female    DOB: [birth year], 67 y.o.   MRN: [id number]   Chief Complaint: medical management of chronic issues     HPI:  [name] is a 67 y.o. who identifies as a female who was assigned female at birth.   Social history: Lives with: husband Work history: retired   Water engineer in today for follow up of the following chronic medical issues:  1. Essential hypertension No c/o chest pain, sob or headache. Does not check blood pressure at home. BP Readings from Last 3 Encounters:  12/07/22 104/71  11/11/22 (!) 159/87  10/27/22 (!) 158/93      2. Angina pectoris (HCC) No recent chest pain  3. CAD S/P percutaneous coronary angioplasty Last saw cardiology in March 2024. Review of office note showed no change in plan of care. Has follow up in September.  4. Hyperlipidemia LDL goal <70 Does not watch diet and does no exercise. Refused statin therapy for years but did start on crestor earlier this year. Lab Results  Component Value Date   CHOL 122 06/08/2022   HDL 43 06/08/2022   LDLCALC 56 06/08/2022   TRIG 129 06/08/2022   CHOLHDL 2.8 06/08/2022     5. Acquired hypothyroidism No issues that she is aware of. Lab Results  Component Value Date   TSH 3.860 01/08/2022     6. Diet-controlled diabetes mellitus (HCC) Doe snot check blood sugars very often at home. Lab Results  Component Value Date   HGBA1C 6.1 (H) 06/08/2022  '  7. Squamous cell carcinoma of supraglottis (HCC) This was back in 2013 and has been doing well. No reoccurrence.  8. Anxiety Has a very anxious personality. Worries about everything. Takes xanax BID.    12/07/2022   11:41 AM 10/27/2022    2:41 PM 06/08/2022   11:47 AM 01/08/2022    9:03 AM  GAD 7 : Generalized Anxiety Score  Nervous, Anxious, on Edge 1 2 3 3   Control/stop worrying 2 2 3 3   Worry too much - different things 2 2 3 3   Trouble relaxing 0 0 1 0  Restless 0 0 0 0  Easily annoyed or  irritable 0 0 0 0  Afraid - awful might happen 3 3 3 3   Total GAD 7 Score 8 9 13 12   Anxiety Difficulty Not difficult at all Not difficult at all Not difficult at all Not difficult at all     9. Recurrent major depressive disorder, in partial remission (HCC) Currently on no anti depressant.    12/07/2022   11:41 AM 11/11/2022    9:22 AM 10/27/2022    2:42 PM  Depression screen PHQ 2/9  Decreased Interest 1 1   Down, Depressed, Hopeless 1 1   PHQ - 2 Score 2 2   Altered sleeping 3 3 3   Tired, decreased energy 2 2 3   Change in appetite 1 1 2   Feeling bad or failure about yourself  0 0 0  Trouble concentrating 0 0 0  Moving slowly or fidgety/restless 0 0 0  Suicidal thoughts 0 0 0  PHQ-9 Score 8 8   Difficult doing work/chores Not difficult at all Not difficult at all Not difficult at all    10. Primary insomnia Doe snot want to take anything because afraid she wont wake up if her husband needs her.  11. Panic attacks No recent panic attacks  12. BMI 33.0-33.9,adult No recent weight  change. Wt Readings from Last 3 Encounters:  12/07/22 213 lb (96.6 kg)  11/11/22 217 lb (98.4 kg)  10/27/22 217 lb (98.4 kg)   BMI Readings from Last 3 Encounters:  12/07/22 38.96 kg/m  11/11/22 39.06 kg/m  10/27/22 39.06 kg/m     New complaints: None today  Allergies  Allergen Reactions   Azithromycin Diarrhea   Lisinopril Cough   Sulfa Antibiotics Other (See Comments)    Unable to recall   Outpatient Encounter Medications as of 12/07/2022  Medication Sig   acetaminophen (TYLENOL) 500 MG tablet Take 500 mg by mouth every 6 (six) hours as needed for moderate pain.   ALPRAZolam (XANAX) 0.5 MG tablet Take 1 tablet (0.5 mg total) by mouth 2 (two) times daily.   amLODipine (NORVASC) 5 MG tablet Take 1 tablet (5 mg total) by mouth daily.   aspirin EC 81 MG tablet Take 1 tablet (81 mg total) by mouth daily. Swallow whole.   budesonide-formoterol (SYMBICORT) 160-4.5 MCG/ACT inhaler  Inhale 2 puffs into the lungs 2 (two) times daily.   ezetimibe (ZETIA) 10 MG tablet Take 1 tablet (10 mg total) by mouth daily.   fenofibrate 160 MG tablet Take 1 tablet (160 mg total) by mouth daily.   hydrocortisone cream 1 % Apply 1 Application topically 2 (two) times daily as needed for itching.   isosorbide mononitrate (IMDUR) 30 MG 24 hr tablet Take 1 tablet (30 mg total) by mouth daily.   levothyroxine (SYNTHROID) 75 MCG tablet TAKE 1 TABLET EVERY DAY BEFORE BREAKFAST   losartan (COZAAR) 100 MG tablet Take 1 tablet (100 mg total) by mouth daily.   metoprolol tartrate (LOPRESSOR) 25 MG tablet TAKE 0.5 TABLET (12.5 MG TOTAL) BY MOUTH 2 TIMES DAILY.   nitroGLYCERIN (NITROSTAT) 0.4 MG SL tablet Place 1 tablet (0.4 mg total) under the tongue every 5 (five) minutes as needed for chest pain.   rosuvastatin (CRESTOR) 10 MG tablet TAKE 1 TABLET BY MOUTH EVERY DAY   sodium fluoride (DENTAGEL) 1.1 % GEL dental gel APPLY 1 APPLICATION ONTO TEETH AT BEDTIME. (Patient taking differently: Place 1 Application onto teeth 4 (four) times a week.)   ticagrelor (BRILINTA) 90 MG TABS tablet Take 1 tablet (90 mg total) by mouth 2 (two) times daily.   [DISCONTINUED] amLODipine (NORVASC) 5 MG tablet Take 1 tablet (5 mg total) by mouth daily.   No facility-administered encounter medications on file as of 12/07/2022.    Past Surgical History:  Procedure Laterality Date   COLON RESECTION     COLONOSCOPY     04/2020   CORONARY STENT INTERVENTION N/A 04/10/2022   Procedure: CORONARY STENT INTERVENTION;  Surgeon: Lennette Bihari, MD;  Location: [facility]VASIVE [facility];  Service: Cardiovascular;  Laterality: N/A;   KNEE SURGERY     LEFT HEART CATH AND CORONARY ANGIOGRAPHY N/A 04/10/2022   Procedure: LEFT HEART CATH AND CORONARY ANGIOGRAPHY;  Surgeon: Lennette Bihari, MD;  Location: [facility] INVASIVE CV LAB;  Service: Cardiovascular;  Laterality: N/A;   oral surgery tooth extraction     TONSILLECTOMY     TOTAL ABDOMINAL  HYSTERECTOMY     VULVECTOMY PARTIAL Bilateral 03/30/2018   Procedure: LASER PARTIAL VULVECTOMY FOR VULVUL INTRAEPITHELIA NEOPLASIS III;  Surgeon: Lazaro Arms, MD;  Location: [facility];  Service: Gynecology;  Laterality: Bilateral;    Family History  Problem Relation Age of Onset   Cancer Maternal Grandfather        lung   Heart attack Father  Heart failure Father    Cancer Mother 30       breast    Cancer Maternal Aunt 40       pancreas   Mental illness Daughter       Controlled substance contract: 12/07/22      Review of Systems  Constitutional:  Negative for diaphoresis.  Eyes:  Negative for pain.  Respiratory:  Negative for shortness of breath.   Cardiovascular:  Negative for chest pain, palpitations and leg swelling.  Gastrointestinal:  Negative for abdominal pain.  Endocrine: Negative for polydipsia.  Skin:  Negative for rash.  Neurological:  Negative for dizziness, weakness and headaches.  Hematological:  Does not bruise/bleed easily.  All other systems reviewed and are negative.      Objective:   Physical Exam Vitals and nursing note reviewed.  Constitutional:      General: She is not in acute distress.    Appearance: Normal appearance. She is well-developed.  HENT:     Head: Normocephalic.     Right Ear: Tympanic membrane normal.     Left Ear: Tympanic membrane normal.     Nose: Nose normal.     Mouth/Throat:     Mouth: Mucous membranes are moist.  Eyes:     Pupils: Pupils are equal, round, and reactive to light.  Neck:     Vascular: No carotid bruit or JVD.  Cardiovascular:     Rate and Rhythm: Normal rate and regular rhythm.     Heart sounds: Normal heart sounds.  Pulmonary:     Effort: Pulmonary effort is normal. No respiratory distress.     Breath sounds: Normal breath sounds. No wheezing or rales.  Chest:     Chest wall: No tenderness.  Abdominal:     General: Bowel sounds are normal. There is no distension or abdominal bruit.      Palpations: Abdomen is soft. There is no hepatomegaly, splenomegaly, mass or pulsatile mass.     Tenderness: There is no abdominal tenderness.  Musculoskeletal:        General: Normal range of motion.     Cervical back: Normal range of motion and neck supple.  Lymphadenopathy:     Cervical: No cervical adenopathy.  Skin:    General: Skin is warm and dry.  Neurological:     Mental Status: She is alert and oriented to person, place, and time.     Deep Tendon Reflexes: Reflexes are normal and symmetric.  Psychiatric:        Behavior: Behavior normal.        Thought Content: Thought content normal.        Judgment: Judgment normal.     BP 104/71   Pulse 70   Temp (!) 96.1 F (35.6 C) (Temporal)   Resp 20   Ht 5\' 2"  (1.575 m)   Wt 213 lb (96.6 kg)   SpO2 95%   BMI 38.96 kg/m        Assessment & Plan:  [name] comes in today with chief complaint of Medical Management of Chronic Issues   Diagnosis and orders addressed:  1. Essential hypertension Low sodium - losartan (COZAAR) 100 MG tablet; Take 1 tablet (100 mg total) by mouth daily.  Dispense: 90 tablet; Refill: 1 - CBC with Differential/Platelet - CMP14+EGFR  2. Angina pectoris (HCC) Report any chest pain  3. CAD S/P percutaneous coronary angioplasty  4. Hyperlipidemia LDL goal <70 Low fat diet - Lipid panel - fenofibrate 160 MG  tablet; Take 1 tablet (160 mg total) by mouth daily.  Dispense: 90 tablet; Refill: 1    5. Acquired hypothyroidism Labs pending - levothyroxine (SYNTHROID) 75 MCG tablet; TAKE 1 TABLET EVERY DAY BEFORE BREAKFAST  Dispense: 90 tablet; Refill: 1 - Thyroid Panel With TSH  6. Diet-controlled diabetes mellitus (HCC) Continue to watch carbs in diet  7. Squamous cell carcinoma of supraglottis (HCC) No reoccurrence  8. Anxiety Stress management - ToxASSURE Select 13 (MW), Urine - ALPRAZolam (XANAX) 0.5 MG tablet; Take 1 tablet (0.5 mg total) by mouth 2 (two) times daily.   Dispense: 180 tablet; Refill: 1  9. Recurrent major depressive disorder, in partial remission (HCC)  10. Primary insomnia Bedtime routine  11. Panic attacks  12. BMI 33.0-33.9,adult Discussed diet and exercise for person with BMI >25 Will recheck weight in 3-6 months    Labs pending Health Maintenance reviewed Diet and exercise encouraged  Follow up plan: 6 months   Mary-Margaret Daphine Deutscher, FNP

## 2022-12-08 LAB — CMP14+EGFR
ALT: 20 IU/L (ref 0–32)
AST: 18 IU/L (ref 0–40)
Albumin: 4.8 g/dL (ref 3.9–4.9)
Alkaline Phosphatase: 46 IU/L (ref 44–121)
BUN/Creatinine Ratio: 19 (ref 12–28)
BUN: 23 mg/dL (ref 8–27)
Bilirubin Total: 0.5 mg/dL (ref 0.0–1.2)
CO2: 22 mmol/L (ref 20–29)
Calcium: 10.2 mg/dL (ref 8.7–10.3)
Chloride: 102 mmol/L (ref 96–106)
Creatinine, Ser: 1.21 mg/dL — ABNORMAL HIGH (ref 0.57–1.00)
Globulin, Total: 2.4 g/dL (ref 1.5–4.5)
Glucose: 107 mg/dL — ABNORMAL HIGH (ref 70–99)
Potassium: 4.5 mmol/L (ref 3.5–5.2)
Sodium: 141 mmol/L (ref 134–144)
Total Protein: 7.2 g/dL (ref 6.0–8.5)
eGFR: 49 mL/min/{1.73_m2} — ABNORMAL LOW (ref >59)

## 2022-12-08 LAB — THYROID PANEL WITH TSH
Free Thyroxine Index: 2.1 (ref 1.2–4.9)
T3 Uptake Ratio: 29 % (ref 24–39)
T4, Total: 7.4 ug/dL (ref 4.5–12.0)
TSH: 3.44 u[IU]/mL (ref 0.450–4.500)

## 2022-12-08 LAB — LIPID PANEL
Chol/HDL Ratio: 4.9 ratio — ABNORMAL HIGH (ref 0.0–4.4)
Cholesterol, Total: 200 mg/dL — ABNORMAL HIGH (ref 100–199)
HDL: 41 mg/dL (ref >39)
LDL Chol Calc (NIH): 122 mg/dL — ABNORMAL HIGH (ref 0–99)
Triglycerides: 208 mg/dL — ABNORMAL HIGH (ref 0–149)
VLDL Cholesterol Cal: 37 mg/dL (ref 5–40)

## 2022-12-08 LAB — CBC WITH DIFFERENTIAL/PLATELET
Basophils Absolute: 0 10*3/uL (ref 0.0–0.2)
Basos: 1 %
EOS (ABSOLUTE): 0.1 10*3/uL (ref 0.0–0.4)
Eos: 2 %
Hematocrit: 41.9 % (ref 34.0–46.6)
Hemoglobin: 14.1 g/dL (ref 11.1–15.9)
Immature Grans (Abs): 0 10*3/uL (ref 0.0–0.1)
Immature Granulocytes: 0 %
Lymphocytes Absolute: 1.1 10*3/uL (ref 0.7–3.1)
Lymphs: 18 %
MCH: 30.4 pg (ref 26.6–33.0)
MCHC: 33.7 g/dL (ref 31.5–35.7)
MCV: 90 fL (ref 79–97)
Monocytes Absolute: 0.4 10*3/uL (ref 0.1–0.9)
Monocytes: 7 %
Neutrophils Absolute: 4.2 10*3/uL (ref 1.4–7.0)
Neutrophils: 72 %
Platelets: 241 10*3/uL (ref 150–450)
RBC: 4.64 x10E6/uL (ref 3.77–5.28)
RDW: 13.2 % (ref 11.7–15.4)
WBC: 5.8 10*3/uL (ref 3.4–10.8)

## 2022-12-09 LAB — TOXASSURE SELECT 13 (MW), URINE

## 2023-01-09 DIAGNOSIS — S80812A Abrasion, left lower leg, initial encounter: Secondary | ICD-10-CM | POA: Diagnosis not present

## 2023-01-09 DIAGNOSIS — Z23 Encounter for immunization: Secondary | ICD-10-CM | POA: Diagnosis not present

## 2023-01-12 ENCOUNTER — Encounter: Payer: Self-pay | Admitting: Cardiology

## 2023-02-24 NOTE — Progress Notes (Unsigned)
Cardiology Office Note  Date: 02/25/2023   ID: Domanique, Quaderer March 25, 1956, MRN [id number]  History of Present Illness: [name] is a 67 y.o. female last seen in March by Ms. Philis Nettle NP, I reviewed the note.  She is here for a follow-up visit.  Reports no angina or nitroglycerin use, stable NYHA class II dyspnea, no palpitations or syncope.  We went over her medications.  She continues on aspirin, Brilinta, Norvasc, Imdur, Zetia, Cozaar, and Lopressor.  She did not tolerate Crestor complaining of leg pain similar to what she had experienced previously on Lipitor.  She had a follow-up LDL in July at 122.  Discussed stopping Brilinta at the end of November and continuing aspirin.  ECG today shows normal sinus rhythm.  Physical Exam: VS:  BP 118/80   Pulse 70   Ht 5\' 2"  (1.575 m)   Wt 220 lb 6.4 oz (100 kg)   SpO2 95%   BMI 40.31 kg/m , BMI Body mass index is 40.31 kg/m.  Wt Readings from Last 3 Encounters:  02/25/23 220 lb 6.4 oz (100 kg)  12/07/22 213 lb (96.6 kg)  11/11/22 217 lb (98.4 kg)    General: Patient appears comfortable at rest. HEENT: Conjunctiva and lids normal. Neck: Supple, no elevated JVP or carotid bruits, no thyromegaly. Lungs: Clear to auscultation, nonlabored breathing at rest. Cardiac: Regular rate and rhythm, no S3 or significant systolic murmur. Extremities: No pitting edema.  ECG:  An ECG dated 04/10/2022 was personally reviewed today and demonstrated:  Sinus bradycardia.  Labwork: 12/07/2022: ALT 20; AST 18; BUN 23; Creatinine, Ser 1.21; Hemoglobin 14.1; Platelets 241; Potassium 4.5; Sodium 141; TSH 3.440     Component Value Date/Time   CHOL 200 (H) 12/07/2022 1428   TRIG 208 (H) 12/07/2022 1428   TRIG 344 (H) 05/28/2014 1343   HDL 41 12/07/2022 1428   HDL 42 05/28/2014 1343   CHOLHDL 4.9 (H) 12/07/2022 1428   LDLCALC 122 (H) 12/07/2022 1428   Other Studies Reviewed Today:  No interval cardiac testing for review today.  Assessment and  Plan:  1.  CAD status post DES to the proximal LAD in November 2023 with residual RCA/PDA disease managed medically.  She does not describe any angina or nitroglycerin use at this time.  Continue aspirin with plan to stop Brilinta at the end of November.  She is also on Lopressor and Imdur along with Zetia.  2.  Essential hypertension.  Blood pressure well-controlled today.  Continue Cozaar and Norvasc.  3.  Mixed hyperlipidemia.  LDL was 122 in July.  She has a history of statin myalgias including Lipitor and most recently Crestor which she discontinued.  Currently on Zetia which is unlikely to get her LDL to goal.  Lipid clinic referral made for discussion and review of coverage for PCSK9 inhibitor.  Disposition:  Follow up  6 months.  Signed, Jonelle Sidle, M.D., F.A.C.C. [facility]

## 2023-02-25 ENCOUNTER — Ambulatory Visit: Payer: Medicare HMO | Attending: Cardiology | Admitting: Cardiology

## 2023-02-25 ENCOUNTER — Encounter: Payer: Self-pay | Admitting: Cardiology

## 2023-02-25 ENCOUNTER — Other Ambulatory Visit: Payer: Self-pay | Admitting: *Deleted

## 2023-02-25 VITALS — BP 118/80 | HR 70 | Ht 62.0 in | Wt 220.4 lb

## 2023-02-25 DIAGNOSIS — Z789 Other specified health status: Secondary | ICD-10-CM | POA: Diagnosis not present

## 2023-02-25 DIAGNOSIS — I25119 Atherosclerotic heart disease of native coronary artery with unspecified angina pectoris: Secondary | ICD-10-CM

## 2023-02-25 DIAGNOSIS — Z6841 Body Mass Index (BMI) 40.0 and over, adult: Secondary | ICD-10-CM | POA: Diagnosis not present

## 2023-02-25 DIAGNOSIS — I10 Essential (primary) hypertension: Secondary | ICD-10-CM

## 2023-02-25 DIAGNOSIS — E782 Mixed hyperlipidemia: Secondary | ICD-10-CM

## 2023-02-25 MED ORDER — ASPIRIN 81 MG PO TBEC: 81.0000 mg | DELAYED_RELEASE_TABLET | Freq: Every day | ORAL | 3 refills | Status: DC

## 2023-02-25 MED ORDER — TICAGRELOR 90 MG PO TABS: 90.0000 mg | ORAL_TABLET | Freq: Two times a day (BID) | ORAL | 0 refills | Status: DC

## 2023-02-25 NOTE — Patient Instructions (Addendum)
Medication Instructions:  Your physician has recommended you make the following change in your medication:  Stop brilinta at the end of November 2024. Continue all other medications as prescribed  Labwork: none  Testing/Procedures: none  Follow-Up: Your physician recommends that you schedule a follow-up appointment in: 6 months  Any Other Special Instructions Will Be Listed Below (If Applicable). You have been referred to Lipid Clinic  If you need a refill on your cardiac medications before your next appointment, please call your pharmacy.

## 2023-04-02 ENCOUNTER — Telehealth: Payer: Self-pay | Admitting: Nurse Practitioner

## 2023-04-08 ENCOUNTER — Encounter: Payer: Self-pay | Admitting: Nurse Practitioner

## 2023-04-19 ENCOUNTER — Telehealth: Payer: Self-pay

## 2023-04-19 NOTE — Patient Outreach (Signed)
Attempted to contact patient regarding A1c, dm eye, mcw. Left voicemail for patient to return my call at [phone].  Nicholes Rough, CMA Care Guide [employer]

## 2023-04-21 ENCOUNTER — Encounter: Payer: Self-pay | Admitting: Obstetrics & Gynecology

## 2023-04-21 ENCOUNTER — Ambulatory Visit: Payer: Medicare HMO | Admitting: Obstetrics & Gynecology

## 2023-04-21 ENCOUNTER — Other Ambulatory Visit (HOSPITAL_COMMUNITY)
Admission: RE | Admit: 2023-04-21 | Discharge: 2023-04-21 | Disposition: A | Discharge status: Home / Self Care | Payer: Medicare HMO | Source: Ambulatory Visit | Attending: Obstetrics & Gynecology | Admitting: Obstetrics & Gynecology

## 2023-04-21 VITALS — BP 139/86 | HR 72 | Ht 62.5 in | Wt 218.0 lb

## 2023-04-21 DIAGNOSIS — Z1151 Encounter for screening for human papillomavirus (HPV): Secondary | ICD-10-CM | POA: Diagnosis not present

## 2023-04-21 DIAGNOSIS — Z87412 Personal history of vulvar dysplasia: Secondary | ICD-10-CM | POA: Diagnosis not present

## 2023-04-21 DIAGNOSIS — Z01419 Encounter for gynecological examination (general) (routine) without abnormal findings: Secondary | ICD-10-CM | POA: Insufficient documentation

## 2023-04-23 ENCOUNTER — Ambulatory Visit
Payer: Medicare HMO | Attending: Cardiovascular Disease | Admitting: Pharmacist Clinician (PhC)/ Clinical Pharmacy Specialist

## 2023-04-23 DIAGNOSIS — E785 Hyperlipidemia, unspecified: Secondary | ICD-10-CM | POA: Diagnosis not present

## 2023-04-23 NOTE — Progress Notes (Signed)
Office Visit    Patient Name: [name] Date of Encounter: 04/25/2023  Primary Care Provider:  Bennie Pierini, FNP Primary Cardiologist:  Nona Dell, MD  Chief Complaint    Hyperlipidemia   Significant Past Medical History   CAD 11/23 DES to LAD  HTN Controlled on amlodipine and losartan  preDM 1/24 A1c 6.1           Allergies  Allergen Reactions   Azithromycin Diarrhea   Lisinopril Cough   Sulfa Antibiotics Other (See Comments)    Unable to recall    History of Present Illness    [name] is a 67 y.o. female patient of Dr Diona Browner, in the office today to discuss options for cholesterol management.  . She saw Dr. Diona Browner on 02/25/23. At that time, she was not having any chest pain or palpitations. She mentioned that she did not tolerate Crestor due to leg pain, which was similar to what she had experienced on Lipitor.    Today the patient is seen for hyperlipidemia follow-up. She mentions that although she is no longer on statin therapy, she continues to have some residual muscle pain in her knees. She does have complaints of breathlessness, thought to be caused by Brilinta. She plans on discontinuing the Brilinta at the end of November. Her physical activity is minimal. She does have a treadmill at home, but since experiencing breathlessness, she has not used it. She doesn't eat red meat, but she does enjoy chicken and tuna. She takes Coenzyme Q10 and Hawthorn supplements.    Insurance Carrier: [employer] Gold Plus [id number]  LDL Cholesterol goal:  LDL < 70  Current Medications:   ezetimibe 10 mg every day, fenofibrate 160 mg every day   Previously tried:  atorvastatin, rosuvastatin  Family Hx:  Mother (deceased)-cancer (breast) Father (deceased)- Heart attack, heart failure Sister died cervical cancer last month  Social Hx: No current alcohol use No drug use Cigarette smoker (smokes once a day)       Diet:  berries, nuts, oatmeal daily,  chicken and tuna   Exercise: limited by SOB from Brilinta    Accessory Clinical Findings   Lab Results  Component Value Date   CHOL 200 (H) 12/07/2022   HDL 41 12/07/2022   LDLCALC 122 (H) 12/07/2022   TRIG 208 (H) 12/07/2022   CHOLHDL 4.9 (H) 12/07/2022    Lipoprotein (a)  Date/Time Value Ref Range Status  04/11/2022 01:04 AM 52.6 (H) <75.0 nmol/L Final    Comment:    (NOTE) Note:  Values greater than or equal to 75.0 nmol/L may       indicate an independent risk factor for CHD,       but must be evaluated with caution when applied       to non-Caucasian populations due to the       influence of genetic factors on Lp(a) across       ethnicities. Performed At: [facility] [address] [place], [place] [id number] Jolene Schimke MD KV:[id number]     Lab Results  Component Value Date   ALT 20 12/07/2022   AST 18 12/07/2022   ALKPHOS 46 12/07/2022   BILITOT 0.5 12/07/2022   Lab Results  Component Value Date   CREATININE 1.21 (H) 12/07/2022   BUN 23 12/07/2022   NA 141 12/07/2022   K 4.5 12/07/2022   CL 102 12/07/2022   CO2 22 12/07/2022   Lab Results  Component Value Date  HGBA1C 6.1 (H) 06/08/2022    Home Medications    Current Outpatient Medications  Medication Sig Dispense Refill   acetaminophen (TYLENOL) 500 MG tablet Take 500 mg by mouth every 6 (six) hours as needed for moderate pain. (Patient not taking: Reported on 04/21/2023)     ALPRAZolam (XANAX) 0.5 MG tablet Take 1 tablet (0.5 mg total) by mouth 2 (two) times daily. 180 tablet 1   amLODipine (NORVASC) 5 MG tablet Take 1 tablet (5 mg total) by mouth daily. 90 tablet 3   aspirin EC 81 MG tablet Take 1 tablet (81 mg total) by mouth daily. Swallow whole. 90 tablet 3   ezetimibe (ZETIA) 10 MG tablet Take 1 tablet (10 mg total) by mouth daily. 90 tablet 3   fenofibrate 160 MG tablet Take 1 tablet (160 mg total) by mouth daily. 90 tablet 1   hydrocortisone cream 1 % Apply 1 Application  topically 2 (two) times daily as needed for itching.     isosorbide mononitrate (IMDUR) 30 MG 24 hr tablet Take 1 tablet (30 mg total) by mouth daily. 90 tablet 3   levothyroxine (SYNTHROID) 75 MCG tablet TAKE 1 TABLET EVERY DAY BEFORE BREAKFAST 90 tablet 1   losartan (COZAAR) 100 MG tablet Take 1 tablet (100 mg total) by mouth daily. 90 tablet 1   metoprolol tartrate (LOPRESSOR) 25 MG tablet TAKE 0.5 TABLET (12.5 MG TOTAL) BY MOUTH 2 TIMES DAILY. 90 tablet 1   nitroGLYCERIN (NITROSTAT) 0.4 MG SL tablet Place 1 tablet (0.4 mg total) under the tongue every 5 (five) minutes as needed for chest pain. (Patient not taking: Reported on 04/21/2023) 20 tablet 2   sodium fluoride (DENTAGEL) 1.1 % GEL dental gel APPLY 1 APPLICATION ONTO TEETH AT BEDTIME. (Patient taking differently: Place 1 Application onto teeth 4 (four) times a week.) 112 g 5   ticagrelor (BRILINTA) 90 MG TABS tablet Take 1 tablet (90 mg total) by mouth 2 (two) times daily. Stop 05/01/2023 60 tablet 0   No current facility-administered medications for this visit.     Assessment & Plan    Hyperlipidemia LDL goal <70 A: LDL goal <70 with prediabetes. We discussed with the patient that if she becomes diabetic, her LDL goal would be <55. We discussed Repatha as being a potential option for her. She is not allergic to latex. We discussed healthy diet and foods that can worsen cholesterol. Overall, we were able to answer questions and address any concerns she had about Repatha. She wishes to do some research about it at home before she makes her decision. We also explained that fenofibrate works better with food in the stomach. We instructed her to take it in the evening when she eats.   P: Continue with all medications. The patient will reach out to [place] if she has questions, or would like to consider Repatha as an option.  Buddy Duty, PharmD Candidate [facility] Class of 2025  I was with patient and student throughout entire  visit and agree with above assessment and plan.   Phillips Hay, PharmD CPP [facility] [address]  [place], [place] [address] [phone]  04/25/2023, 3:19 PM

## 2023-04-23 NOTE — Patient Instructions (Addendum)
Your Results:             Your most recent labs Goal  Total Cholesterol 200 < 200  Triglycerides 208 < 150  HDL (happy/good cholesterol) 41 > 40  LDL (lousy/bad cholesterol 122 < 70   If you decide you want to try the Repatha, you can reach out to [place] and we can get it covered by your insurance plan.   If you decide to use it, we can reach out to the [facility] to help with the costs.   You can call me with any further questions or concerns.  Belenda Cruise at [phone]    Thank you for choosing [facility]   High Triglycerides Eating Plan Triglycerides are a type of fat in the blood. High levels of triglycerides can increase your risk of heart disease and stroke. If your triglyceride levels are high, choosing the right foods can help lower your triglycerides and keep your heart healthy. Work with your health care provider or a dietitian to develop an eating plan that is right for you. What are tips for following this plan? General guidelines  Lose weight, if you are overweight. For most people, losing 5-10 lb (2-5 kg) helps lower triglyceride levels. A weight-loss plan may include: 30 minutes of exercise at least 5 days a week. Reducing the amount of calories, sugar, and fat you eat. Eat a wide variety of fresh fruits, vegetables, and whole grains. These foods are high in fiber. Eat foods that contain healthy fats, such as fatty fish, nuts, seeds, and olive oil. Avoid foods that are high in added sugar, added salt (sodium), and saturated fat. Avoid low-fiber, refined carbohydrates such as white bread, crackers, noodles, and white rice. Avoid foods with trans fats or partially hydrogenated oils, such as fried foods or stick margarine. If you drink alcohol: Limit how much you have to: 0-1 drink a day for women who are not pregnant. 0-2 drinks a day for men. Your health care provider may recommend that you drink less than these amounts depending on your overall health. Know  how much alcohol is in a drink. In the [place] one drink equals one 12 oz bottle of beer (355 mL), one 5 oz glass of wine (148 mL), or one 1 oz glass of hard liquor (44 mL). Reading food labels Check food labels for: The amount of saturated fat. Choose foods with no or very little saturated fat (less than 2 g). The amount of trans fat. Choose foods with no transfat. The amount of cholesterol. Choose foods that are low in cholesterol. The amount of sodium. Choose foods with less than 140 milligrams (mg) per serving. Shopping Buy dairy products labeled as nonfat (skim) or low-fat (1%). Avoid buying processed or prepackaged foods. These are often high in added sugar, sodium, and fat. Cooking Choose healthy fats when cooking, such as olive oil, avocado oil, or canola oil. Cook foods using lower fat methods, such as baking, broiling, boiling, or grilling. Make your own sauces, dressings, and marinades when possible, instead of buying them. Store-bought sauces, dressings, and marinades are often high in sodium and sugar. Meal planning Eat more home-cooked food and less restaurant, buffet, and fast food. Eat fatty fish at least 2 times each week. Examples of fatty fish include salmon, trout, sardines, mackerel, tuna, and herring. If you eat whole eggs, do not eat more than 4 egg yolks per week. What foods should I eat? Fruits All fresh, canned (in natural juice), or frozen  fruits. Vegetables Fresh or frozen vegetables. Low-sodium canned vegetables. Grains Whole wheat or whole grain breads, crackers, cereals, and pasta. Unsweetened oatmeal. Bulgur. Barley. Quinoa. Brown rice. Whole wheat flour tortillas. Meats and other proteins Skinless chicken or [place]. Ground chicken or [place]. Lean cuts of pork, trimmed of fat. Fish and seafood, especially salmon, trout, and herring. Egg whites. Dried beans, peas, or lentils. Unsalted nuts or seeds. Unsalted canned beans. Natural peanut or almond butter or  other nut butters. Dairy Low-fat dairy products. Skim or low-fat (1%) milk. Reduced fat (2%) and low-sodium cheese. Low-fat ricotta cheese. Low-fat cottage cheese. Plain, low-fat yogurt. Fats and oils Tub margarine without trans fats. Light or reduced-fat mayonnaise. Light or reduced-fat salad dressings. Avocado. Safflower, olive, sunflower, soybean, and canola oils. The items listed above may not be a complete list of recommended foods and beverages. Talk with your dietitian about what dietary choices are best for you. What foods should I avoid? Fruits Sweetened dried fruit. Canned fruit in syrup. Fruit juice. Vegetables Creamed or fried vegetables. Vegetables in a cheese sauce. Grains White bread. White (regular) pasta. White rice. Cornbread. Bagels. Pastries. Crackers that contain trans fat. Meats and other proteins Fatty cuts of meat. Ribs. Chicken wings. Tomasa Blase. Sausage. Bologna. Salami. Chitterlings. Fatback. Hot dogs. Bratwurst. Packaged lunch meats. Dairy Whole or reduced-fat (2%) milk. Half-and-half. Cream cheese. Full-fat or sweetened yogurt. Full-fat cheese. Nondairy creamers. Whipped toppings. Processed cheese or cheese spreads. Cheese curds. Fats and oils Butter. Stick margarine. Lard. Shortening. Ghee. Bacon fat. Tropical oils, such as coconut, palm kernel, or palm oils. Beverages Alcohol. Sweetened drinks, such as soda, lemonade, fruit drinks, or punches. Sweets and desserts Corn syrup. Sugars. Honey. Molasses. Candy. Jam and jelly. Syrup. Sweetened cereals. Cookies. Pies. Cakes. Donuts. Muffins. Ice cream. Condiments Store-bought sauces, dressings, and marinades that are high in sugar, such as ketchup and barbecue sauce. The items listed above may not be a complete list of foods and beverages you should avoid. Talk with your dietitian about what dietary choices are best for you. Summary High levels of triglycerides can increase the risk of heart disease and stroke. Choosing  the right foods can help lower your triglycerides. Eat plenty of fresh fruits, vegetables, and whole grains. Choose low-fat dairy and lean meats. Eat fatty fish at least twice a week. Avoid processed and prepackaged foods with added sugar, sodium, saturated fat, and trans fat. If you need suggestions or have questions about what types of food are good for you, talk with your health care provider or a dietitian. This information is not intended to replace advice given to you by your health care provider. Make sure you discuss any questions you have with your health care provider. Document Revised: 09/27/2020 Document Reviewed: 09/27/2020 Elsevier Patient Education  2024 [employer].

## 2023-04-25 ENCOUNTER — Encounter: Payer: Self-pay | Admitting: Pharmacist Clinician (PhC)/ Clinical Pharmacy Specialist

## 2023-04-25 NOTE — Assessment & Plan Note (Signed)
A: LDL goal <70 with prediabetes. We discussed with the patient that if she becomes diabetic, her LDL goal would be <55. We discussed Repatha as being a potential option for her. She is not allergic to latex. We discussed healthy diet and foods that can worsen cholesterol. Overall, we were able to answer questions and address any concerns she had about Repatha. She wishes to do some research about it at home before she makes her decision. We also explained that fenofibrate works better with food in the stomach. We instructed her to take it in the evening when she eats.   P: Continue with all medications. The patient will reach out to [place] if she has questions, or would like to consider Repatha as an option.

## 2023-04-27 ENCOUNTER — Ambulatory Visit: Payer: Medicare HMO | Admitting: Nurse Practitioner

## 2023-04-27 ENCOUNTER — Ambulatory Visit (INDEPENDENT_AMBULATORY_CARE_PROVIDER_SITE_OTHER): Payer: Medicare HMO

## 2023-04-27 DIAGNOSIS — Z23 Encounter for immunization: Secondary | ICD-10-CM

## 2023-04-27 LAB — CYTOLOGY - PAP
Comment: NEGATIVE
Diagnosis: NEGATIVE
High risk HPV: NEGATIVE

## 2023-05-24 ENCOUNTER — Other Ambulatory Visit: Payer: Self-pay | Admitting: Nurse Practitioner

## 2023-05-28 DIAGNOSIS — H524 Presbyopia: Secondary | ICD-10-CM | POA: Diagnosis not present

## 2023-05-28 DIAGNOSIS — H5213 Myopia, bilateral: Secondary | ICD-10-CM | POA: Diagnosis not present

## 2023-05-28 LAB — HM DIABETES EYE EXAM

## 2023-06-11 ENCOUNTER — Ambulatory Visit: Payer: Medicare HMO | Admitting: Nurse Practitioner

## 2023-06-11 ENCOUNTER — Encounter: Payer: Self-pay | Admitting: Nurse Practitioner

## 2023-06-11 VITALS — BP 95/67 | HR 63 | Temp 96.9°F | Ht 62.0 in | Wt 213.0 lb

## 2023-06-11 DIAGNOSIS — F5101 Primary insomnia: Secondary | ICD-10-CM

## 2023-06-11 DIAGNOSIS — F3341 Major depressive disorder, recurrent, in partial remission: Secondary | ICD-10-CM | POA: Diagnosis not present

## 2023-06-11 DIAGNOSIS — I2510 Atherosclerotic heart disease of native coronary artery without angina pectoris: Secondary | ICD-10-CM

## 2023-06-11 DIAGNOSIS — C321 Malignant neoplasm of supraglottis: Secondary | ICD-10-CM

## 2023-06-11 DIAGNOSIS — F419 Anxiety disorder, unspecified: Secondary | ICD-10-CM | POA: Diagnosis not present

## 2023-06-11 DIAGNOSIS — E1159 Type 2 diabetes mellitus with other circulatory complications: Secondary | ICD-10-CM | POA: Diagnosis not present

## 2023-06-11 DIAGNOSIS — E119 Type 2 diabetes mellitus without complications: Secondary | ICD-10-CM | POA: Diagnosis not present

## 2023-06-11 DIAGNOSIS — I209 Angina pectoris, unspecified: Secondary | ICD-10-CM

## 2023-06-11 DIAGNOSIS — F410 Panic disorder [episodic paroxysmal anxiety] without agoraphobia: Secondary | ICD-10-CM

## 2023-06-11 DIAGNOSIS — I10 Essential (primary) hypertension: Secondary | ICD-10-CM

## 2023-06-11 DIAGNOSIS — E785 Hyperlipidemia, unspecified: Secondary | ICD-10-CM

## 2023-06-11 DIAGNOSIS — Z9861 Coronary angioplasty status: Secondary | ICD-10-CM

## 2023-06-11 DIAGNOSIS — Z6833 Body mass index (BMI) 33.0-33.9, adult: Secondary | ICD-10-CM | POA: Diagnosis not present

## 2023-06-11 DIAGNOSIS — E039 Hypothyroidism, unspecified: Secondary | ICD-10-CM | POA: Diagnosis not present

## 2023-06-11 LAB — BAYER DCA HB A1C WAIVED: HB A1C (BAYER DCA - WAIVED): 6.5 % — ABNORMAL HIGH (ref 4.8–5.6)

## 2023-06-11 MED ORDER — SODIUM FLUORIDE 1.1 % DT GEL: DENTAL | 5 refills | Status: DC

## 2023-06-11 MED ORDER — ALPRAZOLAM 0.5 MG PO TABS: 0.5000 mg | ORAL_TABLET | Freq: Two times a day (BID) | ORAL | 1 refills | Status: DC

## 2023-06-11 MED ORDER — METOPROLOL TARTRATE 25 MG PO TABS: 12.5000 mg | ORAL_TABLET | Freq: Two times a day (BID) | ORAL | 1 refills | Status: DC

## 2023-06-11 MED ORDER — EZETIMIBE 10 MG PO TABS: 10.0000 mg | ORAL_TABLET | Freq: Every day | ORAL | 1 refills | Status: DC

## 2023-06-11 MED ORDER — FENOFIBRATE 160 MG PO TABS: 160.0000 mg | ORAL_TABLET | Freq: Every day | ORAL | 1 refills | Status: DC

## 2023-06-11 MED ORDER — AMLODIPINE BESYLATE 5 MG PO TABS: 5.0000 mg | ORAL_TABLET | Freq: Every day | ORAL | 1 refills | Status: DC

## 2023-06-11 MED ORDER — LOSARTAN POTASSIUM 100 MG PO TABS: 100.0000 mg | ORAL_TABLET | Freq: Every day | ORAL | 1 refills | Status: DC

## 2023-06-11 MED ORDER — LEVOTHYROXINE SODIUM 75 MCG PO TABS: ORAL_TABLET | ORAL | 1 refills | Status: DC

## 2023-06-11 NOTE — Progress Notes (Signed)
 Subjective:    Patient ID: [name], female    DOB: 07-Feb-1956, 69 y.o.   MRN: [id number]   Chief Complaint: medical management of chronic issues     HPI:  [name] is a 68 y.o. who identifies as a female who was assigned female at birth.   Social history: Lives with: husband Work history: retired   Water Engineer in today for follow up of the following chronic medical issues:  1. Essential hypertension No c/o chest pain, sob or headache. Does not check blood pressure at home. BP Readings from Last 3 Encounters:  04/21/23 139/86  02/25/23 118/80  12/07/22 104/71      2. Angina pectoris (HCC) No recent chest pain  3. CAD S/P percutaneous coronary angioplasty Last saw cardiology in March 2024. Review of office note showed no change in plan of care. Has follow up in September.  4. Hyperlipidemia LDL goal <70 Does not watch diet and does no exercise. Refused statin therapy for years but did start on crestor  earlier this year.  But has already stopped taking due to caused left knee pain. Refuses repatha. Lab Results  Component Value Date   CHOL 200 (H) 12/07/2022   HDL 41 12/07/2022   LDLCALC 122 (H) 12/07/2022   TRIG 208 (H) 12/07/2022   CHOLHDL 4.9 (H) 12/07/2022     5. Acquired hypothyroidism No issues that she is aware of. Lab Results  Component Value Date   TSH 3.440 12/07/2022     6. Diet-controlled diabetes mellitus (HCC) Doe snot check blood sugars very often at home. Lab Results  Component Value Date   HGBA1C 6.1 (H) 06/08/2022  '  7. Squamous cell carcinoma of supraglottis (HCC) This was back in 2013 and has been doing well. No reoccurrence.  8. Anxiety Has a very anxious personality. Worries about everything. Takes xanax  BID.    04/21/2023    1:52 PM 12/07/2022   11:41 AM 10/27/2022    2:41 PM 06/08/2022   11:47 AM  GAD 7 : Generalized Anxiety Score  Nervous, Anxious, on Edge 1 1 2 3   Control/stop worrying 1 2 2 3   Worry too much -  different things 1 2 2 3   Trouble relaxing 0 0 0 1  Restless 0 0 0 0  Easily annoyed or irritable 0 0 0 0  Afraid - awful might happen 1 3 3 3   Total GAD 7 Score 4 8 9 13   Anxiety Difficulty  Not difficult at all Not difficult at all Not difficult at all     9. Recurrent major depressive disorder, in partial remission (HCC) Currently on no anti depressant.    04/21/2023    1:50 PM 12/07/2022   11:41 AM 11/11/2022    9:22 AM  Depression screen PHQ 2/9  Decreased Interest 0 1 1  Down, Depressed, Hopeless 2 1 1   PHQ - 2 Score 2 2 2   Altered sleeping 3 3 3   Tired, decreased energy 1 2 2   Change in appetite 0 1 1  Feeling bad or failure about yourself  0 0 0  Trouble concentrating 0 0 0  Moving slowly or fidgety/restless 0 0 0  Suicidal thoughts 0 0 0  PHQ-9 Score 6 8 8   Difficult doing work/chores Not difficult at all Not difficult at all Not difficult at all    10. Primary insomnia Doe snot want to take anything because afraid she wont wake up if her husband needs her.  11.  Panic attacks No recent panic attacks  12. BMI 33.0-33.9,adult Weight is down 5lbs  Wt Readings from Last 3 Encounters:  06/11/23 213 lb (96.6 kg)  04/21/23 218 lb (98.9 kg)  02/25/23 220 lb 6.4 oz (100 kg)   BMI Readings from Last 3 Encounters:  06/11/23 38.96 kg/m  04/21/23 39.24 kg/m  02/25/23 40.31 kg/m      New complaints: None today  Allergies  Allergen Reactions   Azithromycin  Diarrhea   Lisinopril  Cough   Sulfa Antibiotics Other (See Comments)    Unable to recall   Outpatient Encounter Medications as of 06/11/2023  Medication Sig   acetaminophen  (TYLENOL ) 500 MG tablet Take 500 mg by mouth every 6 (six) hours as needed for moderate pain. (Patient not taking: Reported on 04/21/2023)   ALPRAZolam  (XANAX ) 0.5 MG tablet Take 1 tablet (0.5 mg total) by mouth 2 (two) times daily.   amLODipine  (NORVASC ) 5 MG tablet Take 1 tablet (5 mg total) by mouth daily.   aspirin  EC 81 MG  tablet Take 1 tablet (81 mg total) by mouth daily. Swallow whole.   ezetimibe  (ZETIA ) 10 MG tablet TAKE 1 TABLET EVERY DAY   fenofibrate  160 MG tablet Take 1 tablet (160 mg total) by mouth daily.   hydrocortisone cream 1 % Apply 1 Application topically 2 (two) times daily as needed for itching.   isosorbide  mononitrate (IMDUR ) 30 MG 24 hr tablet TAKE 1 TABLET EVERY DAY   levothyroxine  (SYNTHROID ) 75 MCG tablet TAKE 1 TABLET EVERY DAY BEFORE BREAKFAST   losartan  (COZAAR ) 100 MG tablet Take 1 tablet (100 mg total) by mouth daily.   metoprolol  tartrate (LOPRESSOR ) 25 MG tablet TAKE 0.5 TABLET (12.5 MG TOTAL) BY MOUTH 2 TIMES DAILY.   nitroGLYCERIN  (NITROSTAT ) 0.4 MG SL tablet Place 1 tablet (0.4 mg total) under the tongue every 5 (five) minutes as needed for chest pain. (Patient not taking: Reported on 04/21/2023)   sodium fluoride  (DENTAGEL) 1.1 % GEL dental gel APPLY 1 APPLICATION ONTO TEETH AT BEDTIME. (Patient taking differently: Place 1 Application onto teeth 4 (four) times a week.)   ticagrelor  (BRILINTA ) 90 MG TABS tablet Take 1 tablet (90 mg total) by mouth 2 (two) times daily. Stop 05/01/2023   No facility-administered encounter medications on file as of 06/11/2023.    Past Surgical History:  Procedure Laterality Date   COLON RESECTION     COLONOSCOPY     04/2020   CORONARY STENT INTERVENTION N/A 04/10/2022   Procedure: CORONARY STENT INTERVENTION;  Surgeon: Burnard Debby LABOR, MD;  Location: [facility];  Service: Cardiovascular;  Laterality: N/A;   KNEE SURGERY     LEFT HEART CATH AND CORONARY ANGIOGRAPHY N/A 04/10/2022   Procedure: LEFT HEART CATH AND CORONARY ANGIOGRAPHY;  Surgeon: Burnard Debby LABOR, MD;  Location: [facility];  Service: Cardiovascular;  Laterality: N/A;   oral surgery tooth extraction     TONSILLECTOMY     TOTAL ABDOMINAL HYSTERECTOMY     VULVECTOMY PARTIAL Bilateral 03/30/2018   Procedure: LASER PARTIAL VULVECTOMY FOR VULVUL INTRAEPITHELIA NEOPLASIS  III;  Surgeon: Jayne Vonn DEL, MD;  Location: [facility];  Service: Gynecology;  Laterality: Bilateral;    Family History  Problem Relation Age of Onset   Cancer Maternal Grandfather        lung   Heart attack Father    Heart failure Father    Cancer Mother 16       breast    Cancer Maternal Aunt 29  pancreas   Mental illness Daughter       Controlled substance contract: 12/07/22      Review of Systems  Constitutional:  Negative for diaphoresis.  Eyes:  Negative for pain.  Respiratory:  Negative for shortness of breath.   Cardiovascular:  Negative for chest pain, palpitations and leg swelling.  Gastrointestinal:  Negative for abdominal pain.  Endocrine: Negative for polydipsia.  Skin:  Negative for rash.  Neurological:  Negative for dizziness, weakness and headaches.  Hematological:  Does not bruise/bleed easily.  All other systems reviewed and are negative.      Objective:   Physical Exam Vitals and nursing note reviewed.  Constitutional:      General: She is not in acute distress.    Appearance: Normal appearance. She is well-developed.  HENT:     Head: Normocephalic.     Right Ear: Tympanic membrane normal.     Left Ear: Tympanic membrane normal.     Nose: Nose normal.     Mouth/Throat:     Mouth: Mucous membranes are moist.  Eyes:     Pupils: Pupils are equal, round, and reactive to light.  Neck:     Vascular: No carotid bruit or JVD.  Cardiovascular:     Rate and Rhythm: Normal rate and regular rhythm.     Heart sounds: Normal heart sounds.  Pulmonary:     Effort: Pulmonary effort is normal. No respiratory distress.     Breath sounds: Normal breath sounds. No wheezing or rales.  Chest:     Chest wall: No tenderness.  Abdominal:     General: Bowel sounds are normal. There is no distension or abdominal bruit.     Palpations: Abdomen is soft. There is no hepatomegaly, splenomegaly, mass or pulsatile mass.     Tenderness: There is no abdominal  tenderness.  Musculoskeletal:        General: Normal range of motion.     Cervical back: Normal range of motion and neck supple.  Lymphadenopathy:     Cervical: No cervical adenopathy.  Skin:    General: Skin is warm and dry.  Neurological:     Mental Status: She is alert and oriented to person, place, and time.     Deep Tendon Reflexes: Reflexes are normal and symmetric.  Psychiatric:        Behavior: Behavior normal.        Thought Content: Thought content normal.        Judgment: Judgment normal.     There were no vitals taken for this visit.       Assessment & Plan:  [name] comes in today with chief complaint of No chief complaint on file.   Diagnosis and orders addressed:  1. Essential hypertension Low sodium - losartan  (COZAAR ) 100 MG tablet; Take 1 tablet (100 mg total) by mouth daily.  Dispense: 90 tablet; Refill: 1 - CBC with Differential/Platelet - CMP14+EGFR  2. Angina pectoris (HCC) Report any chest pain  3. CAD S/P percutaneous coronary angioplasty  4. Hyperlipidemia LDL goal <70 Low fat diet - Lipid panel - fenofibrate  160 MG tablet; Take 1 tablet (160 mg total) by mouth daily.  Dispense: 90 tablet; Refill: 1    5. Acquired hypothyroidism Labs pending - levothyroxine  (SYNTHROID ) 75 MCG tablet; TAKE 1 TABLET EVERY DAY BEFORE BREAKFAST  Dispense: 90 tablet; Refill: 1 - Thyroid  Panel With TSH  6. Diet-controlled diabetes mellitus (HCC) Continue to watch carbs in diet  7. Squamous cell  carcinoma of supraglottis (HCC) No reoccurrence  8. Anxiety Stress management - ToxASSURE Select 13 (MW), Urine - ALPRAZolam  (XANAX ) 0.5 MG tablet; Take 1 tablet (0.5 mg total) by mouth 2 (two) times daily.  Dispense: 180 tablet; Refill: 1  9. Recurrent major depressive disorder, in partial remission (HCC)  10. Primary insomnia Bedtime routine  11. Panic attacks  12. BMI 33.0-33.9,adult Discussed diet and exercise for person with BMI >25 Will  recheck weight in 3-6 months    Labs pending Health Maintenance reviewed Diet and exercise encouraged  Follow up plan: 6 months   Mary-Margaret Gladis, FNP

## 2023-06-11 NOTE — Patient Instructions (Signed)

## 2023-06-12 LAB — CBC WITH DIFFERENTIAL/PLATELET
Basophils Absolute: 0 10*3/uL (ref 0.0–0.2)
Basos: 0 %
EOS (ABSOLUTE): 0.2 10*3/uL (ref 0.0–0.4)
Eos: 3 %
Hematocrit: 43.6 % (ref 34.0–46.6)
Hemoglobin: 14.5 g/dL (ref 11.1–15.9)
Immature Grans (Abs): 0 10*3/uL (ref 0.0–0.1)
Immature Granulocytes: 0 %
Lymphocytes Absolute: 1.6 10*3/uL (ref 0.7–3.1)
Lymphs: 21 %
MCH: 30.1 pg (ref 26.6–33.0)
MCHC: 33.3 g/dL (ref 31.5–35.7)
MCV: 91 fL (ref 79–97)
Monocytes Absolute: 0.5 10*3/uL (ref 0.1–0.9)
Monocytes: 7 %
Neutrophils Absolute: 5 10*3/uL (ref 1.4–7.0)
Neutrophils: 69 %
Platelets: 311 10*3/uL (ref 150–450)
RBC: 4.81 x10E6/uL (ref 3.77–5.28)
RDW: 13.3 % (ref 11.7–15.4)
WBC: 7.4 10*3/uL (ref 3.4–10.8)

## 2023-06-12 LAB — CMP14+EGFR
ALT: 27 [IU]/L (ref 0–32)
AST: 21 [IU]/L (ref 0–40)
Albumin: 5.1 g/dL — ABNORMAL HIGH (ref 3.9–4.9)
Alkaline Phosphatase: 64 [IU]/L (ref 44–121)
BUN/Creatinine Ratio: 19 (ref 12–28)
BUN: 22 mg/dL (ref 8–27)
Bilirubin Total: 0.5 mg/dL (ref 0.0–1.2)
CO2: 23 mmol/L (ref 20–29)
Calcium: 10 mg/dL (ref 8.7–10.3)
Chloride: 102 mmol/L (ref 96–106)
Creatinine, Ser: 1.17 mg/dL — ABNORMAL HIGH (ref 0.57–1.00)
Globulin, Total: 2.3 g/dL (ref 1.5–4.5)
Glucose: 103 mg/dL — ABNORMAL HIGH (ref 70–99)
Potassium: 4.5 mmol/L (ref 3.5–5.2)
Sodium: 140 mmol/L (ref 134–144)
Total Protein: 7.4 g/dL (ref 6.0–8.5)
eGFR: 51 mL/min/{1.73_m2} — ABNORMAL LOW (ref >59)

## 2023-06-12 LAB — THYROID PANEL WITH TSH
Free Thyroxine Index: 2 (ref 1.2–4.9)
T3 Uptake Ratio: 28 % (ref 24–39)
T4, Total: 7 ug/dL (ref 4.5–12.0)
TSH: 4.39 u[IU]/mL (ref 0.450–4.500)

## 2023-06-12 LAB — LIPID PANEL
Chol/HDL Ratio: 5.2 {ratio} — ABNORMAL HIGH (ref 0.0–4.4)
Cholesterol, Total: 197 mg/dL (ref 100–199)
HDL: 38 mg/dL — ABNORMAL LOW (ref >39)
LDL Chol Calc (NIH): 126 mg/dL — ABNORMAL HIGH (ref 0–99)
Triglycerides: 185 mg/dL — ABNORMAL HIGH (ref 0–149)
VLDL Cholesterol Cal: 33 mg/dL (ref 5–40)

## 2023-06-13 LAB — MICROALBUMIN / CREATININE URINE RATIO
Creatinine, Urine: 261.2 mg/dL
Microalb/Creat Ratio: 70 mg/g{creat} — ABNORMAL HIGH (ref 0–29)
Microalbumin, Urine: 182.8 ug/mL

## 2023-08-30 ENCOUNTER — Encounter: Payer: Self-pay | Admitting: Cardiology

## 2023-08-30 ENCOUNTER — Other Ambulatory Visit: Payer: Self-pay | Admitting: Cardiology

## 2023-08-30 ENCOUNTER — Ambulatory Visit: Payer: Medicare HMO | Attending: Cardiology | Admitting: Cardiology

## 2023-08-30 VITALS — BP 132/82 | HR 75 | Ht 62.5 in | Wt 221.2 lb

## 2023-08-30 DIAGNOSIS — E782 Mixed hyperlipidemia: Secondary | ICD-10-CM | POA: Diagnosis not present

## 2023-08-30 DIAGNOSIS — I10 Essential (primary) hypertension: Secondary | ICD-10-CM

## 2023-08-30 DIAGNOSIS — Z789 Other specified health status: Secondary | ICD-10-CM

## 2023-08-30 DIAGNOSIS — I25119 Atherosclerotic heart disease of native coronary artery with unspecified angina pectoris: Secondary | ICD-10-CM | POA: Diagnosis not present

## 2023-08-30 MED ORDER — ISOSORBIDE MONONITRATE ER 30 MG PO TB24: 30.0000 mg | ORAL_TABLET | Freq: Every day | ORAL | 2 refills | Status: DC

## 2023-08-30 MED ORDER — NEXLIZET 180-10 MG PO TABS: 1.0000 | ORAL_TABLET | Freq: Every day | ORAL | 6 refills | Status: DC

## 2023-08-30 NOTE — Patient Instructions (Addendum)
 Medication Instructions:  Your physician recommends that you continue on your current medications as directed. Please refer to the Current Medication list given to you today. We have sent a prescription for nexlizet 180/10 mg daily to [facility] to check the cost. If your insurance covers this, we will stop zetia and have you start nexlizet.   Labwork: none  Testing/Procedures: none  Follow-Up: Your physician recommends that you schedule a follow-up appointment in: 6 months  Any Other Special Instructions Will Be Listed Below (If Applicable).  If you need a refill on your cardiac medications before your next appointment, please call your pharmacy.

## 2023-08-30 NOTE — Progress Notes (Signed)
    Cardiology Office Note  Date: 08/30/2023   ID: Wadie, Mattie March 28, 1956, MRN [id number]  History of Present Illness: [name] is a 68 y.o. female last seen in September 2024.  She is here today with her husband for a follow-up visit.  She does not report any angina or interval nitroglycerin use.  Stable NYHA class II dyspnea.  No palpitations or syncope.  She was seen in the lipid clinic in November 2024, note reviewed.  She does not want to consider PCSK9 inhibitors at this time.  I did talk with her today about adding bempedoic acid to Zetia potentially.  For follow-up LDL was 126 in January.  She is also working on diet and weight loss.  We reviewed the remainder of her medications, stable from a cardiac perspective.  I rechecked her blood pressure today at 132/82.  Physical Exam: VS:  BP 132/82 (BP Location: Right Arm)   Pulse 75   Ht 5' 2.5" (1.588 m)   Wt 221 lb 3.2 oz (100.3 kg)   SpO2 95%   BMI 39.81 kg/m , BMI Body mass index is 39.81 kg/m.  Wt Readings from Last 3 Encounters:  08/30/23 221 lb 3.2 oz (100.3 kg)  06/11/23 213 lb (96.6 kg)  04/21/23 218 lb (98.9 kg)    General: Patient appears comfortable at rest. HEENT: Conjunctiva and lids normal. Neck: Supple, no elevated JVP or carotid bruits. Lungs: Clear to auscultation, nonlabored breathing at rest. Cardiac: Regular rate and rhythm, no S3 or significant systolic murmur, no pericardial rub.  ECG:  An ECG dated 02/25/2023 was personally reviewed today and demonstrated:  Sinus bradycardia.  Labwork: 06/11/2023: ALT 27; AST 21; BUN 22; Creatinine, Ser 1.17; Hemoglobin 14.5; Platelets 311; Potassium 4.5; Sodium 140; TSH 4.390     Component Value Date/Time   CHOL 197 06/11/2023 1253   TRIG 185 (H) 06/11/2023 1253   TRIG 344 (H) 05/28/2014 1343   HDL 38 (L) 06/11/2023 1253   HDL 42 05/28/2014 1343   CHOLHDL 5.2 (H) 06/11/2023 1253   LDLCALC 126 (H) 06/11/2023 1253   Other Studies Reviewed  Today:  No interval cardiac testing for review today.  Assessment and Plan:  1.  CAD status post DES to the proximal LAD in November 2023 with residual RCA/PDA disease managed medically.  No angina or interval nitroglycerin use. Continue aspirin 81 mg daily, Zetia 10 mg daily, Imdur 30 mg daily, and as needed nitroglycerin.   2.  Primary hypertension.  Continue current regimen including Norvasc 5 mg daily, Lopressor 12.5 mg twice daily and Cozaar 100 mg daily.   3.  Mixed hyperlipidemia.  LDL 126 in January.  She has a history of statin myalgias including Lipitor and most recently Crestor which she discontinued.  She was seen in the lipid clinic in January 2024, not interested in PCSK9 inhibitors at this time.  We did talk about potential addition of bempedoic acid and will look into her coverage.  Disposition:  Follow up  6 months.  Signed, Jonelle Sidle, M.D., F.A.C.C. [facility]

## 2023-08-31 ENCOUNTER — Other Ambulatory Visit (HOSPITAL_COMMUNITY): Payer: Self-pay

## 2023-08-31 ENCOUNTER — Telehealth: Payer: Self-pay | Admitting: Pharmacy Technician

## 2023-08-31 NOTE — Telephone Encounter (Signed)
 Pharmacy Patient Advocate Encounter   Received notification from Physician's Office that prior authorization for Nexlizet is required/requested.   Insurance verification completed.   The patient is insured through [place] .   Per test claim: PA required; PA submitted to above mentioned insurance via CoverMyMeds Key/confirmation #/EOC [id number] Status is pending

## 2023-08-31 NOTE — Telephone Encounter (Signed)
 Pharmacy Patient Advocate Encounter  Received notification from [facility] that Prior Authorization for Nexlizet has been APPROVED from 06/02/23 to 05/31/24. Ran test claim, Copay is $315.33- one month(DEDUCTIBLE). This test claim was processed through [facility]- copay amounts may vary at other pharmacies due to pharmacy/plan contracts, or as the patient moves through the different stages of their insurance plan.   PA #/Case ID/Reference #: [id number]

## 2023-08-31 NOTE — Telephone Encounter (Signed)
-----   Message from Cheree Ditto sent at 08/31/2023 10:48 AM EDT ----- Regarding: FW: Nexlizet Coverage Please run PA for Nexlizet ----- Message ----- From: Eustace Moore, RN Sent: 08/30/2023  12:13 PM EDT To: [facility] Subject: Nexlizet Coverage                              Hello. Can you tell me what coverage this patient has for nexlizet? What her cost would be?

## 2023-08-31 NOTE — Addendum Note (Signed)
 Addended by: Eustace Moore on: 08/31/2023 02:46 PM   Modules accepted: Orders

## 2023-09-01 MED ORDER — NEXLIZET 180-10 MG PO TABS: 1.0000 | ORAL_TABLET | Freq: Every day | ORAL | 1 refills | Status: DC

## 2023-09-01 NOTE — Addendum Note (Signed)
 Addended by: Eustace Moore on: 09/01/2023 03:51 PM   Modules accepted: Orders

## 2023-09-01 NOTE — Telephone Encounter (Signed)
 Patient returned RN's call.

## 2023-09-01 NOTE — Addendum Note (Signed)
 Addended by: Eustace Moore on: 09/01/2023 02:51 PM   Modules accepted: Orders

## 2023-09-01 NOTE — Telephone Encounter (Signed)
 Patient informed and verbalized understanding of plan. Nexlizet prescription sent to [facility].

## 2023-09-01 NOTE — Telephone Encounter (Signed)
 Called to clarify instructions for stopping zetia and starting nexlizet. Advised that she will stop zetia when she starts nexlizet. Verbalized understanding.

## 2023-09-01 NOTE — Telephone Encounter (Addendum)
 Per patient, she was advised by [facility] that a 90 day supply would cost her $545.98 for a 90 day supply, and $328 for a month supply. Says she has already met her deductible for the year 2025. Says she will not be starting nexlizet due to the cost and will stay on zetia 10 mg daily.

## 2023-12-09 ENCOUNTER — Encounter: Payer: Self-pay | Admitting: Nurse Practitioner

## 2023-12-09 ENCOUNTER — Ambulatory Visit: Payer: Medicare HMO | Admitting: Nurse Practitioner

## 2023-12-09 ENCOUNTER — Other Ambulatory Visit: Payer: Self-pay

## 2023-12-09 VITALS — BP 104/70 | HR 68 | Temp 98.0°F | Ht 62.0 in | Wt 213.0 lb

## 2023-12-09 DIAGNOSIS — I10 Essential (primary) hypertension: Secondary | ICD-10-CM

## 2023-12-09 DIAGNOSIS — E039 Hypothyroidism, unspecified: Secondary | ICD-10-CM | POA: Diagnosis not present

## 2023-12-09 DIAGNOSIS — F5101 Primary insomnia: Secondary | ICD-10-CM

## 2023-12-09 DIAGNOSIS — F419 Anxiety disorder, unspecified: Secondary | ICD-10-CM | POA: Diagnosis not present

## 2023-12-09 DIAGNOSIS — E119 Type 2 diabetes mellitus without complications: Secondary | ICD-10-CM | POA: Diagnosis not present

## 2023-12-09 DIAGNOSIS — F3341 Major depressive disorder, recurrent, in partial remission: Secondary | ICD-10-CM

## 2023-12-09 DIAGNOSIS — E785 Hyperlipidemia, unspecified: Secondary | ICD-10-CM

## 2023-12-09 DIAGNOSIS — C321 Malignant neoplasm of supraglottis: Secondary | ICD-10-CM | POA: Diagnosis not present

## 2023-12-09 DIAGNOSIS — L989 Disorder of the skin and subcutaneous tissue, unspecified: Secondary | ICD-10-CM

## 2023-12-09 DIAGNOSIS — F410 Panic disorder [episodic paroxysmal anxiety] without agoraphobia: Secondary | ICD-10-CM

## 2023-12-09 DIAGNOSIS — Z6833 Body mass index (BMI) 33.0-33.9, adult: Secondary | ICD-10-CM

## 2023-12-09 DIAGNOSIS — I2510 Atherosclerotic heart disease of native coronary artery without angina pectoris: Secondary | ICD-10-CM | POA: Diagnosis not present

## 2023-12-09 DIAGNOSIS — I209 Angina pectoris, unspecified: Secondary | ICD-10-CM

## 2023-12-09 LAB — LIPID PANEL

## 2023-12-09 LAB — BAYER DCA HB A1C WAIVED: HB A1C (BAYER DCA - WAIVED): 6.5 % — ABNORMAL HIGH (ref 4.8–5.6)

## 2023-12-09 MED ORDER — FENOFIBRATE 160 MG PO TABS: 160.0000 mg | ORAL_TABLET | Freq: Every day | ORAL | 1 refills | Status: DC

## 2023-12-09 MED ORDER — LOSARTAN POTASSIUM 100 MG PO TABS: 100.0000 mg | ORAL_TABLET | Freq: Every day | ORAL | 1 refills | Status: DC

## 2023-12-09 MED ORDER — ALPRAZOLAM 0.5 MG PO TABS: 0.5000 mg | ORAL_TABLET | Freq: Two times a day (BID) | ORAL | 1 refills | Status: DC

## 2023-12-09 MED ORDER — ASPIRIN 81 MG PO TBEC: 81.0000 mg | DELAYED_RELEASE_TABLET | Freq: Every day | ORAL | 3 refills | Status: DC

## 2023-12-09 MED ORDER — SODIUM FLUORIDE 1.1 % DT GEL: DENTAL | 5 refills | Status: AC

## 2023-12-09 MED ORDER — ISOSORBIDE MONONITRATE ER 30 MG PO TB24: 30.0000 mg | ORAL_TABLET | Freq: Every day | ORAL | 1 refills | Status: DC

## 2023-12-09 MED ORDER — AMLODIPINE BESYLATE 5 MG PO TABS: 5.0000 mg | ORAL_TABLET | Freq: Every day | ORAL | 1 refills | Status: DC

## 2023-12-09 MED ORDER — LEVOTHYROXINE SODIUM 75 MCG PO TABS: ORAL_TABLET | ORAL | 1 refills | Status: DC

## 2023-12-09 MED ORDER — METOPROLOL TARTRATE 25 MG PO TABS: 12.5000 mg | ORAL_TABLET | Freq: Two times a day (BID) | ORAL | 1 refills | Status: DC

## 2023-12-09 MED ORDER — EZETIMIBE 10 MG PO TABS: 10.0000 mg | ORAL_TABLET | Freq: Every day | ORAL | 1 refills | Status: DC

## 2023-12-09 NOTE — Progress Notes (Signed)
 Subjective:    Patient ID: [name], female    DOB: [birth year], 68 y.o.   MRN: [id number]   Chief Complaint: medical management of chronic issues     HPI:  [name] is a 68 y.o. who identifies as a female who was assigned female at birth.   Social history: Lives with: husband Work history: retired   Water engineer in today for follow up of the following chronic medical issues:  1. Essential hypertension No c/o chest pain, sob or headache. Does not check blood pressure at home. BP Readings from Last 3 Encounters:  08/30/23 132/82  06/11/23 95/67  04/21/23 139/86      2. Angina pectoris (HCC) No recent chest pain  3. CAD S/P percutaneous coronary angioplasty Last saw cardiology in March 2024. Review of office note showed no change in plan of care. Has follow up in September.  4. Hyperlipidemia LDL goal <70 Does not watch diet and does no exercise. Refused statin therapy for years but did start on crestor  earlier this year.  But has already stopped taking due to caused left knee pain. Refuses repatha. Lab Results  Component Value Date   CHOL 197 06/11/2023   HDL 38 (L) 06/11/2023   LDLCALC 126 (H) 06/11/2023   TRIG 185 (H) 06/11/2023   CHOLHDL 5.2 (H) 06/11/2023   The 10-year ASCVD risk score (Arnett DK, et al., 2019) is: 23.1%   5. Acquired hypothyroidism No issues that she is aware of. Lab Results  Component Value Date   TSH 4.390 06/11/2023     6. Diet-controlled diabetes mellitus (HCC) Doe snot check blood sugars very often at home. Lab Results  Component Value Date   HGBA1C 6.5 (H) 06/11/2023  '  7. Squamous cell carcinoma of supraglottis (HCC) This was back in 2013 and has been doing well. No reoccurrence.  8. Anxiety Has a very anxious personality. Worries about everything. Takes xanax  BID.    12/09/2023   10:03 AM 06/11/2023   11:20 AM 04/21/2023    1:52 PM 12/07/2022   11:41 AM  GAD 7 : Generalized Anxiety Score  Nervous, Anxious, on  Edge 3 3 1 1   Control/stop worrying 3 3 1 2   Worry too much - different things 3 3 1 2   Trouble relaxing 2 0 0 0  Restless 0 0 0 0  Easily annoyed or irritable 0 1 0 0  Afraid - awful might happen 3 3 1 3   Total GAD 7 Score 14 13 4 8   Anxiety Difficulty Not difficult at all Not difficult at all  Not difficult at all        9. Recurrent major depressive disorder, in partial remission (HCC) Currently on no anti depressant.    12/09/2023   10:03 AM 06/11/2023   11:19 AM 04/21/2023    1:50 PM  Depression screen PHQ 2/9  Decreased Interest 0 2 0  Down, Depressed, Hopeless 1 2 2   PHQ - 2 Score 1 4 2   Altered sleeping 3 3 3   Tired, decreased energy 3 2 1   Change in appetite 2 3 0  Feeling bad or failure about yourself  0 0 0  Trouble concentrating 1 0 0  Moving slowly or fidgety/restless 0 0 0  Suicidal thoughts 0 0 0  PHQ-9 Score 10 12 6   Difficult doing work/chores Not difficult at all Not difficult at all Not difficult at all     10. Primary insomnia Doe snot want to take  anything because afraid she wont wake up if her husband needs her.  11. Panic attacks No recent panic attacks  12. BMI 33.0-33.9,adult Weight is down 8lbs   Wt Readings from Last 3 Encounters:  12/09/23 213 lb (96.6 kg)  08/30/23 221 lb 3.2 oz (100.3 kg)  06/11/23 213 lb (96.6 kg)   BMI Readings from Last 3 Encounters:  12/09/23 38.96 kg/m  08/30/23 39.81 kg/m  06/11/23 38.96 kg/m       New complaints: None today  Allergies  Allergen Reactions   Azithromycin  Diarrhea   Lisinopril  Cough   Sulfa Antibiotics Other (See Comments)    Unable to recall   Outpatient Encounter Medications as of 12/09/2023  Medication Sig   acetaminophen  (TYLENOL ) 500 MG tablet Take 500 mg by mouth every 6 (six) hours as needed for moderate pain (pain score 4-6).   ALPRAZolam  (XANAX ) 0.5 MG tablet Take 1 tablet (0.5 mg total) by mouth 2 (two) times daily.   amLODipine  (NORVASC ) 5 MG tablet Take 1 tablet  (5 mg total) by mouth daily.   aspirin  EC 81 MG tablet Take 1 tablet (81 mg total) by mouth daily. Swallow whole.   ezetimibe  (ZETIA ) 10 MG tablet Take 10 mg by mouth daily.   fenofibrate  160 MG tablet Take 1 tablet (160 mg total) by mouth daily.   hydrocortisone cream 1 % Apply 1 Application topically 2 (two) times daily as needed for itching.   isosorbide  mononitrate (IMDUR ) 30 MG 24 hr tablet Take 1 tablet (30 mg total) by mouth daily.   levothyroxine  (SYNTHROID ) 75 MCG tablet TAKE 1 TABLET EVERY DAY BEFORE BREAKFAST   losartan  (COZAAR ) 100 MG tablet Take 1 tablet (100 mg total) by mouth daily.   metoprolol  tartrate (LOPRESSOR ) 25 MG tablet Take 0.5 tablets (12.5 mg total) by mouth 2 (two) times daily.   mupirocin  ointment (BACTROBAN ) 2 % Apply 1 Application topically 2 (two) times daily as needed.   nitroGLYCERIN  (NITROSTAT ) 0.4 MG SL tablet Place 1 tablet (0.4 mg total) under the tongue every 5 (five) minutes as needed for chest pain.   sodium fluoride  (DENTAGEL) 1.1 % GEL dental gel APPLY 1 APPLICATION ONTO TEETH AT BEDTIME.   No facility-administered encounter medications on file as of 12/09/2023.    Past Surgical History:  Procedure Laterality Date   COLON RESECTION     COLONOSCOPY     04/2020   CORONARY STENT INTERVENTION N/A 04/10/2022   Procedure: CORONARY STENT INTERVENTION;  Surgeon: Burnard Debby LABOR, MD;  Location: [facility];  Service: Cardiovascular;  Laterality: N/A;   KNEE SURGERY     LEFT HEART CATH AND CORONARY ANGIOGRAPHY N/A 04/10/2022   Procedure: LEFT HEART CATH AND CORONARY ANGIOGRAPHY;  Surgeon: Burnard Debby LABOR, MD;  Location: [facility];  Service: Cardiovascular;  Laterality: N/A;   oral surgery tooth extraction     TONSILLECTOMY     TOTAL ABDOMINAL HYSTERECTOMY     VULVECTOMY PARTIAL Bilateral 03/30/2018   Procedure: LASER PARTIAL VULVECTOMY FOR VULVUL INTRAEPITHELIA NEOPLASIS III;  Surgeon: Jayne Vonn DEL, MD;  Location: [facility];  Service:  Gynecology;  Laterality: Bilateral;    Family History  Problem Relation Age of Onset   Cancer Maternal Grandfather        lung   Heart attack Father    Heart failure Father    Cancer Mother 25       breast    Cancer Maternal Aunt 34  pancreas   Mental illness Daughter       Controlled substance contract: 12/07/22      Review of Systems  Constitutional:  Negative for diaphoresis.  Eyes:  Negative for pain.  Respiratory:  Negative for shortness of breath.   Cardiovascular:  Negative for chest pain, palpitations and leg swelling.  Gastrointestinal:  Negative for abdominal pain.  Endocrine: Negative for polydipsia.  Skin:  Negative for rash.  Neurological:  Negative for dizziness, weakness and headaches.  Hematological:  Does not bruise/bleed easily.  All other systems reviewed and are negative.      Objective:   Physical Exam Vitals and nursing note reviewed.  Constitutional:      General: She is not in acute distress.    Appearance: Normal appearance. She is well-developed.  HENT:     Head: Normocephalic.     Right Ear: Tympanic membrane normal.     Left Ear: Tympanic membrane normal.     Nose: Nose normal.     Mouth/Throat:     Mouth: Mucous membranes are moist.  Eyes:     Pupils: Pupils are equal, round, and reactive to light.  Neck:     Vascular: No carotid bruit or JVD.  Cardiovascular:     Rate and Rhythm: Normal rate and regular rhythm.     Heart sounds: Normal heart sounds.  Pulmonary:     Effort: Pulmonary effort is normal. No respiratory distress.     Breath sounds: Normal breath sounds. No wheezing or rales.  Chest:     Chest wall: No tenderness.  Abdominal:     General: Bowel sounds are normal. There is no distension or abdominal bruit.     Palpations: Abdomen is soft. There is no hepatomegaly, splenomegaly, mass or pulsatile mass.     Tenderness: There is no abdominal tenderness.  Musculoskeletal:        General: Normal range of motion.      Cervical back: Normal range of motion and neck supple.  Lymphadenopathy:     Cervical: No cervical adenopathy.  Skin:    General: Skin is warm and dry.  Neurological:     Mental Status: She is alert and oriented to person, place, and time.     Deep Tendon Reflexes: Reflexes are normal and symmetric.  Psychiatric:        Behavior: Behavior normal.        Thought Content: Thought content normal.        Judgment: Judgment normal.     BP 104/70   Pulse 68   Temp 98 F (36.7 C) (Temporal)   Ht 5' 2 (1.575 m)   Wt 213 lb (96.6 kg)   SpO2 96%   BMI 38.96 kg/m         Assessment & Plan:  [name] comes in today with chief complaint of No chief complaint on file.   Diagnosis and orders addressed:  1. Essential hypertension Low sodium - losartan  (COZAAR ) 100 MG tablet; Take 1 tablet (100 mg total) by mouth daily.  Dispense: 90 tablet; Refill: 1 - CBC with Differential/Platelet - CMP14+EGFR  2. Angina pectoris (HCC) Report any chest pain  3. CAD S/P percutaneous coronary angioplasty  4. Hyperlipidemia LDL goal <70 Low fat diet - Lipid panel - fenofibrate  160 MG tablet; Take 1 tablet (160 mg total) by mouth daily.  Dispense: 90 tablet; Refill: 1    5. Acquired hypothyroidism Labs pending - levothyroxine  (SYNTHROID ) 75 MCG tablet; TAKE 1 TABLET  EVERY DAY BEFORE BREAKFAST  Dispense: 90 tablet; Refill: 1 - Thyroid  Panel With TSH  6. Diet-controlled diabetes mellitus (HCC) Continue to watch carbs in diet  7. Squamous cell carcinoma of supraglottis (HCC) No reoccurrence  8. Anxiety Stress management - ToxASSURE Select 13 (MW), Urine - ALPRAZolam  (XANAX ) 0.5 MG tablet; Take 1 tablet (0.5 mg total) by mouth 2 (two) times daily.  Dispense: 180 tablet; Refill: 1  9. Recurrent major depressive disorder, in partial remission (HCC)  10. Primary insomnia Bedtime routine  11. Panic attacks  12. BMI 33.0-33.9,adult Discussed diet and exercise for person  with BMI >25 Will recheck weight in 3-6 months  13. External nasal lesion Referral to derm  Labs pending Health Maintenance reviewed Diet and exercise encouraged  Follow up plan: 6 months   Mary-Margaret Gladis, FNP

## 2023-12-09 NOTE — Patient Instructions (Signed)

## 2023-12-10 ENCOUNTER — Ambulatory Visit: Payer: Self-pay | Admitting: Nurse Practitioner

## 2023-12-10 LAB — CMP14+EGFR
ALT: 22 IU/L (ref 0–32)
AST: 17 IU/L (ref 0–40)
Albumin: 4.9 g/dL (ref 3.9–4.9)
Alkaline Phosphatase: 51 IU/L (ref 44–121)
BUN/Creatinine Ratio: 17 (ref 12–28)
BUN: 23 mg/dL (ref 8–27)
Bilirubin Total: 0.4 mg/dL (ref 0.0–1.2)
CO2: 19 mmol/L — ABNORMAL LOW (ref 20–29)
Calcium: 9.7 mg/dL (ref 8.7–10.3)
Chloride: 102 mmol/L (ref 96–106)
Creatinine, Ser: 1.34 mg/dL — ABNORMAL HIGH (ref 0.57–1.00)
Globulin, Total: 2.1 g/dL (ref 1.5–4.5)
Glucose: 121 mg/dL — ABNORMAL HIGH (ref 70–99)
Potassium: 4.9 mmol/L (ref 3.5–5.2)
Sodium: 139 mmol/L (ref 134–144)
Total Protein: 7 g/dL (ref 6.0–8.5)
eGFR: 43 mL/min/1.73 — ABNORMAL LOW (ref >59)

## 2023-12-10 LAB — CBC WITH DIFFERENTIAL/PLATELET
Basophils Absolute: 0.1 x10E3/uL (ref 0.0–0.2)
Basos: 1 %
EOS (ABSOLUTE): 0.3 x10E3/uL (ref 0.0–0.4)
Eos: 5 %
Hematocrit: 44 % (ref 34.0–46.6)
Hemoglobin: 14.5 g/dL (ref 11.1–15.9)
Immature Grans (Abs): 0 x10E3/uL (ref 0.0–0.1)
Immature Granulocytes: 0 %
Lymphocytes Absolute: 1.5 x10E3/uL (ref 0.7–3.1)
Lymphs: 22 %
MCH: 30.1 pg (ref 26.6–33.0)
MCHC: 33 g/dL (ref 31.5–35.7)
MCV: 91 fL (ref 79–97)
Monocytes Absolute: 0.5 x10E3/uL (ref 0.1–0.9)
Monocytes: 7 %
Neutrophils Absolute: 4.6 x10E3/uL (ref 1.4–7.0)
Neutrophils: 65 %
Platelets: 265 x10E3/uL (ref 150–450)
RBC: 4.82 x10E6/uL (ref 3.77–5.28)
RDW: 13.1 % (ref 11.7–15.4)
WBC: 6.9 x10E3/uL (ref 3.4–10.8)

## 2023-12-10 LAB — LIPID PANEL
Chol/HDL Ratio: 5.2 ratio — ABNORMAL HIGH (ref 0.0–4.4)
Cholesterol, Total: 188 mg/dL (ref 100–199)
HDL: 36 mg/dL — ABNORMAL LOW (ref >39)
LDL Chol Calc (NIH): 117 mg/dL — ABNORMAL HIGH (ref 0–99)
Triglycerides: 201 mg/dL — ABNORMAL HIGH (ref 0–149)
VLDL Cholesterol Cal: 35 mg/dL (ref 5–40)

## 2023-12-11 LAB — MED LIST ATTACHED SEPARATELY

## 2023-12-13 LAB — TOXASSURE SELECT 13 (MW), URINE

## 2024-01-19 NOTE — Telephone Encounter (Signed)
 Pt is checking on referral. Pt wants to go to dr Shona in [place]. She was referred to [facility]

## 2024-02-15 ENCOUNTER — Encounter: Payer: Self-pay | Admitting: Cardiology

## 2024-02-15 ENCOUNTER — Ambulatory Visit: Attending: Cardiology | Admitting: Cardiology

## 2024-02-15 VITALS — BP 132/80 | HR 69 | Ht 62.0 in | Wt 222.2 lb

## 2024-02-15 DIAGNOSIS — I10 Essential (primary) hypertension: Secondary | ICD-10-CM

## 2024-02-15 DIAGNOSIS — I25119 Atherosclerotic heart disease of native coronary artery with unspecified angina pectoris: Secondary | ICD-10-CM | POA: Diagnosis not present

## 2024-02-15 DIAGNOSIS — E782 Mixed hyperlipidemia: Secondary | ICD-10-CM | POA: Diagnosis not present

## 2024-02-15 DIAGNOSIS — Z789 Other specified health status: Secondary | ICD-10-CM

## 2024-02-15 NOTE — Patient Instructions (Addendum)

## 2024-02-15 NOTE — Progress Notes (Signed)
    Cardiology Office Note  Date: 02/15/2024   ID: JAGGER BEAHM, DOB 30-Jul-1955, MRN [id number]  History of Present Illness: [name] is a 68 y.o. female last seen in March.  She is here today with her husband for a follow-up visit.  She does not report any angina or interval nitroglycerin  use.  Remains functional with ADLs, NYHA class II dyspnea, no palpitations or syncope.  Medications reviewed.  She recently had refills per PCP.  As far as her lipid regimen is concerned, she remains comfortable staying on Zetia  10 mg daily and fenofibrate  160 mg daily, does not want to try any other preparations.  We did discuss her diet.  I reviewed her ECG today which shows normal sinus rhythm.  Physical Exam: VS:  BP 132/80   Pulse 69   Ht 5' 2 (1.575 m)   Wt 222 lb 3.2 oz (100.8 kg)   SpO2 95%   BMI 40.64 kg/m , BMI Body mass index is 40.64 kg/m.  Wt Readings from Last 3 Encounters:  02/15/24 222 lb 3.2 oz (100.8 kg)  12/09/23 213 lb (96.6 kg)  08/30/23 221 lb 3.2 oz (100.3 kg)    General: Patient appears comfortable at rest. HEENT: Conjunctiva and lids normal. Neck: Supple, no elevated JVP or carotid bruits. Lungs: Clear to auscultation, nonlabored breathing at rest. Cardiac: Regular rate and rhythm, no S3 or significant systolic murmur. Extremities: No pitting edema.  ECG:  An ECG dated 02/25/2023 was personally reviewed today and demonstrated:  Sinus bradycardia.  Labwork: 06/11/2023: TSH 4.390 12/09/2023: ALT 22; AST 17; BUN 23; Creatinine, Ser 1.34; Hemoglobin 14.5; Platelets 265; Potassium 4.9; Sodium 139     Component Value Date/Time   CHOL 188 12/09/2023 1017   TRIG 201 (H) 12/09/2023 1017   TRIG 344 (H) 05/28/2014 1343   HDL 36 (L) 12/09/2023 1017   HDL 42 05/28/2014 1343   CHOLHDL 5.2 (H) 12/09/2023 1017   LDLCALC 117 (H) 12/09/2023 1017   Other Studies Reviewed Today:  No interval cardiac testing for review today.  Assessment and Plan:  1.  CAD status post  DES to the proximal LAD in November 2023 with residual RCA/PDA disease managed medically.  She does not report any angina or interval nitroglycerin  use.  ECG is normal today.  Continue aspirin  81 mg daily, Zetia  10 mg daily, Imdur  30 mg daily and as needed nitroglycerin .   2.  Primary hypertension.  No change in current regimen which includes Cozaar  100 mg daily, Lopressor  12.5 mg twice daily, and Norvasc  5 mg daily.   3.  Mixed hyperlipidemia.  LDL 117 in July.  She has a history of statin myalgias.  We have discussed other options, she does not want to try bempedoic acid  or PCSK9 inhibitors.  Continue Zetia  10 mg daily, also discussed diet.  Disposition:  Follow up 6 months.  Signed, Jayson JUDITHANN Sierras, M.D., F.A.C.C. [facility]

## 2024-02-22 DIAGNOSIS — S20462A Insect bite (nonvenomous) of left back wall of thorax, initial encounter: Secondary | ICD-10-CM | POA: Diagnosis not present

## 2024-02-22 DIAGNOSIS — Z1283 Encounter for screening for malignant neoplasm of skin: Secondary | ICD-10-CM | POA: Diagnosis not present

## 2024-02-22 DIAGNOSIS — D225 Melanocytic nevi of trunk: Secondary | ICD-10-CM | POA: Diagnosis not present

## 2024-02-22 DIAGNOSIS — B351 Tinea unguium: Secondary | ICD-10-CM | POA: Diagnosis not present

## 2024-04-18 ENCOUNTER — Encounter: Payer: Self-pay | Admitting: Pharmacist

## 2024-04-21 ENCOUNTER — Ambulatory Visit: Admitting: *Deleted

## 2024-04-21 DIAGNOSIS — Z23 Encounter for immunization: Secondary | ICD-10-CM | POA: Diagnosis not present

## 2024-05-15 ENCOUNTER — Encounter: Payer: Self-pay | Admitting: Nurse Practitioner

## 2024-05-15 ENCOUNTER — Ambulatory Visit: Admitting: Nurse Practitioner

## 2024-05-15 VITALS — BP 116/73 | HR 59 | Temp 96.6°F | Ht 62.0 in | Wt 221.0 lb

## 2024-05-15 DIAGNOSIS — I10 Essential (primary) hypertension: Secondary | ICD-10-CM

## 2024-05-15 DIAGNOSIS — Z6833 Body mass index (BMI) 33.0-33.9, adult: Secondary | ICD-10-CM

## 2024-05-15 DIAGNOSIS — E039 Hypothyroidism, unspecified: Secondary | ICD-10-CM

## 2024-05-15 DIAGNOSIS — E785 Hyperlipidemia, unspecified: Secondary | ICD-10-CM

## 2024-05-15 DIAGNOSIS — F419 Anxiety disorder, unspecified: Secondary | ICD-10-CM

## 2024-05-15 DIAGNOSIS — F5101 Primary insomnia: Secondary | ICD-10-CM

## 2024-05-15 DIAGNOSIS — E119 Type 2 diabetes mellitus without complications: Secondary | ICD-10-CM

## 2024-05-15 DIAGNOSIS — C321 Malignant neoplasm of supraglottis: Secondary | ICD-10-CM

## 2024-05-15 DIAGNOSIS — I2510 Atherosclerotic heart disease of native coronary artery without angina pectoris: Secondary | ICD-10-CM

## 2024-05-15 DIAGNOSIS — I209 Angina pectoris, unspecified: Secondary | ICD-10-CM

## 2024-05-15 NOTE — Progress Notes (Signed)
 Subjective:    Patient ID: [name], female    DOB: 09/22/1955, 68 y.o.   MRN: [id number]   Chief Complaint: medical management of chronic issues     HPI:  [name] is a 68 y.o. who identifies as a female who was assigned female at birth.   Social history: Lives with: husband Work history: retired   Water Engineer in today for follow up of the following chronic medical issues:  1. Essential hypertension No c/o chest pain, sob or headache. Does not check blood pressure at home. BP Readings from Last 3 Encounters:  02/15/24 132/80  12/09/23 104/70  08/30/23 132/82    2. Angina pectoris (HCC) No recent chest pain  3. CAD S/P percutaneous coronary angioplasty Last saw cardiology in 02/15/24. Review of office note showed no change in plan of care. Has follow up in September.  4. Hyperlipidemia LDL goal <70 Does not watch diet and does no exercise. Refused statin therapy for years but did start on crestor  earlier this year.  But has already stopped taking due to caused left knee pain. Refuses repatha. Lab Results  Component Value Date   CHOL 188 12/09/2023   HDL 36 (L) 12/09/2023   LDLCALC 117 (H) 12/09/2023   TRIG 201 (H) 12/09/2023   CHOLHDL 5.2 (H) 12/09/2023   The 10-year ASCVD risk score (Arnett DK, et al., 2019) is: 21.7%   5. Acquired hypothyroidism No issues that she is aware of. Lab Results  Component Value Date   TSH 4.390 06/11/2023     6. Diet-controlled diabetes mellitus (HCC) Doe snot check blood sugars very often at home. Lab Results  Component Value Date   HGBA1C 6.5 (H) 12/09/2023  '  7. Squamous cell carcinoma of supraglottis (HCC) This was back in 2013 and has been doing well. No reoccurrence.  8. Anxiety Has a very anxious personality. Worries about everything. Takes xanax  BID.    05/15/2024   10:25 AM 12/09/2023   10:03 AM 06/11/2023   11:20 AM 04/21/2023    1:52 PM  GAD 7 : Generalized Anxiety Score  Nervous, Anxious, on Edge 1  3 3 1   Control/stop worrying 1 3 3 1   Worry too much - different things 1 3 3 1   Trouble relaxing 0 2 0 0  Restless 0 0 0 0  Easily annoyed or irritable 0 0 1 0  Afraid - awful might happen 3 3 3 1   Total GAD 7 Score 6 14 13 4   Anxiety Difficulty Not difficult at all Not difficult at all Not difficult at all           9. Recurrent major depressive disorder, in partial remission (HCC) Currently on no anti depressant.    05/15/2024   10:25 AM 12/09/2023   10:03 AM 06/11/2023   11:19 AM  Depression screen PHQ 2/9  Decreased Interest 1 0 2  Down, Depressed, Hopeless 1 1 2   PHQ - 2 Score 2 1 4   Altered sleeping 1 3 3   Tired, decreased energy 1 3 2   Change in appetite 0 2 3  Feeling bad or failure about yourself  0 0 0  Trouble concentrating 0 1 0  Moving slowly or fidgety/restless 0 0 0  Suicidal thoughts 0 0 0  PHQ-9 Score 4 10  12    Difficult doing work/chores Not difficult at all Not difficult at all Not difficult at all     Data saved with a previous flowsheet row definition  10. Primary insomnia Doe snot want to take anything because afraid she wont wake up if her husband needs her.  11. Panic attacks No recent panic attacks  12. BMI 33.0-33.9,adult Weight is unchanged  Wt Readings from Last 3 Encounters:  05/15/24 221 lb (100.2 kg)  02/15/24 222 lb 3.2 oz (100.8 kg)  12/09/23 213 lb (96.6 kg)   BMI Readings from Last 3 Encounters:  05/15/24 40.42 kg/m  02/15/24 40.64 kg/m  12/09/23 38.96 kg/m           New complaints: None today  Allergies  Allergen Reactions   Azithromycin  Diarrhea   Lisinopril  Cough   Statins Other (See Comments)    myopathy   Sulfa Antibiotics Other (See Comments)    Unable to recall   Outpatient Encounter Medications as of 05/15/2024  Medication Sig   acetaminophen  (TYLENOL ) 500 MG tablet Take 500 mg by mouth every 6 (six) hours as needed for moderate pain (pain score 4-6).   ALPRAZolam  (XANAX ) 0.5 MG tablet  Take 1 tablet (0.5 mg total) by mouth 2 (two) times daily.   amLODipine  (NORVASC ) 5 MG tablet Take 1 tablet (5 mg total) by mouth daily.   aspirin  EC 81 MG tablet Take 1 tablet (81 mg total) by mouth daily. Swallow whole.   ezetimibe  (ZETIA ) 10 MG tablet Take 1 tablet (10 mg total) by mouth daily.   fenofibrate  160 MG tablet Take 1 tablet (160 mg total) by mouth daily.   hydrocortisone cream 1 % Apply 1 Application topically 2 (two) times daily as needed for itching.   isosorbide  mononitrate (IMDUR ) 30 MG 24 hr tablet Take 1 tablet (30 mg total) by mouth daily.   levothyroxine  (SYNTHROID ) 75 MCG tablet TAKE 1 TABLET EVERY DAY BEFORE BREAKFAST   losartan  (COZAAR ) 100 MG tablet Take 1 tablet (100 mg total) by mouth daily.   metoprolol  tartrate (LOPRESSOR ) 25 MG tablet Take 0.5 tablets (12.5 mg total) by mouth 2 (two) times daily.   mupirocin  ointment (BACTROBAN ) 2 % Apply 1 Application topically 2 (two) times daily as needed.   nitroGLYCERIN  (NITROSTAT ) 0.4 MG SL tablet Place 1 tablet (0.4 mg total) under the tongue every 5 (five) minutes as needed for chest pain.   sodium fluoride  (DENTAGEL) 1.1 % GEL dental gel APPLY 1 APPLICATION ONTO TEETH AT BEDTIME.   No facility-administered encounter medications on file as of 05/15/2024.    Past Surgical History:  Procedure Laterality Date   COLON RESECTION     COLONOSCOPY     04/2020   CORONARY STENT INTERVENTION N/A 04/10/2022   Procedure: CORONARY STENT INTERVENTION;  Surgeon: Burnard Debby LABOR, MD;  Location: [facility];  Service: Cardiovascular;  Laterality: N/A;   KNEE SURGERY     LEFT HEART CATH AND CORONARY ANGIOGRAPHY N/A 04/10/2022   Procedure: LEFT HEART CATH AND CORONARY ANGIOGRAPHY;  Surgeon: Burnard Debby LABOR, MD;  Location: [facility];  Service: Cardiovascular;  Laterality: N/A;   oral surgery tooth extraction     TONSILLECTOMY     TOTAL ABDOMINAL HYSTERECTOMY     VULVECTOMY PARTIAL Bilateral 03/30/2018   Procedure:  LASER PARTIAL VULVECTOMY FOR VULVUL INTRAEPITHELIA NEOPLASIS III;  Surgeon: Jayne Vonn DEL, MD;  Location: [facility];  Service: Gynecology;  Laterality: Bilateral;    Family History  Problem Relation Age of Onset   Cancer Maternal Grandfather        lung   Heart attack Father    Heart failure Father  Cancer Mother 26       breast    Cancer Maternal Aunt 40       pancreas   Mental illness Daughter       Controlled substance contract: 12/07/22      Review of Systems  Constitutional:  Negative for diaphoresis.  Eyes:  Negative for pain.  Respiratory:  Negative for shortness of breath.   Cardiovascular:  Negative for chest pain, palpitations and leg swelling.  Gastrointestinal:  Negative for abdominal pain.  Endocrine: Negative for polydipsia.  Skin:  Negative for rash.  Neurological:  Negative for dizziness, weakness and headaches.  Hematological:  Does not bruise/bleed easily.  All other systems reviewed and are negative.      Objective:   Physical Exam Vitals and nursing note reviewed.  Constitutional:      General: She is not in acute distress.    Appearance: Normal appearance. She is well-developed.  HENT:     Head: Normocephalic.     Right Ear: Tympanic membrane normal.     Left Ear: Tympanic membrane normal.     Nose: Nose normal.     Mouth/Throat:     Mouth: Mucous membranes are moist.  Eyes:     Pupils: Pupils are equal, round, and reactive to light.  Neck:     Vascular: No carotid bruit or JVD.  Cardiovascular:     Rate and Rhythm: Normal rate and regular rhythm.     Heart sounds: Normal heart sounds.  Pulmonary:     Effort: Pulmonary effort is normal. No respiratory distress.     Breath sounds: Normal breath sounds. No wheezing or rales.  Chest:     Chest wall: No tenderness.  Abdominal:     General: Bowel sounds are normal. There is no distension or abdominal bruit.     Palpations: Abdomen is soft. There is no hepatomegaly, splenomegaly, mass or  pulsatile mass.     Tenderness: There is no abdominal tenderness.  Musculoskeletal:        General: Normal range of motion.     Cervical back: Normal range of motion and neck supple.  Lymphadenopathy:     Cervical: No cervical adenopathy.  Skin:    General: Skin is warm and dry.  Neurological:     Mental Status: She is alert and oriented to person, place, and time.     Deep Tendon Reflexes: Reflexes are normal and symmetric.  Psychiatric:        Behavior: Behavior normal.        Thought Content: Thought content normal.        Judgment: Judgment normal.     BP 116/73   Pulse (!) 59   Temp (!) 96.6 F (35.9 C) (Temporal)   Ht 5' 2 (1.575 m)   Wt 221 lb (100.2 kg)   SpO2 95%   BMI 40.42 kg/m        Assessment & Plan:  [name] comes in today with chief complaint of No chief complaint on file.   Diagnosis and orders addressed:  1. Essential hypertension Low sodium - losartan  (COZAAR ) 100 MG tablet; Take 1 tablet (100 mg total) by mouth daily.  Dispense: 90 tablet; Refill: 1  2. Angina pectoris (HCC) Report any chest pain  3. CAD S/P percutaneous coronary angioplasty  4. Hyperlipidemia LDL goal <70 Low fat diet  - fenofibrate  160 MG tablet; Take 1 tablet (160 mg total) by mouth daily.  Dispense: 90 tablet; Refill: 1  5. Acquired hypothyroidism Labs pending - levothyroxine  (SYNTHROID ) 75 MCG tablet; TAKE 1 TABLET EVERY DAY BEFORE BREAKFAST  Dispense: 90 tablet; Refill: 1  6. Diet-controlled diabetes mellitus (HCC) Continue to watch carbs in diet  7. Squamous cell carcinoma of supraglottis (HCC) No reoccurrence  8. Anxiety Stress management - ALPRAZolam  (XANAX ) 0.5 MG tablet; Take 1 tablet (0.5 mg total) by mouth 2 (two) times daily.  Dispense: 180 tablet; Refill: 1  9. Recurrent major depressive disorder, in partial remission (HCC)  10. Primary insomnia Bedtime routine  11. Panic attacks  12. BMI 33.0-33.9,adult Discussed diet and exercise  for person with BMI >25 Will recheck weight in 3-6 months  Labs pending Health Maintenance reviewed Diet and exercise encouraged  Follow up plan: Keep follow up in january   Mary-Margaret Yale, [place]

## 2024-05-15 NOTE — Patient Instructions (Signed)
 Fall Prevention in the Home, Adult Falls can cause injuries and can happen to people of all ages. There are many things you can do to make your home safer and to help prevent falls. What actions can I take to prevent falls? General information Use good lighting in all rooms. Make sure to: Replace any light bulbs that burn out. Turn on the lights in dark areas and use night-lights. Keep items that you use often in easy-to-reach places. Lower the shelves around your home if needed. Move furniture so that there are clear paths around it. Do not use throw rugs or other things on the floor that can make you trip. If any of your floors are uneven, fix them. Add color or contrast paint or tape to clearly mark and help you see: Grab bars or handrails. First and last steps of staircases. Where the edge of each step is. If you use a ladder or stepladder: Make sure that it is fully opened. Do not climb a closed ladder. Make sure the sides of the ladder are locked in place. Have someone hold the ladder while you use it. Know where your pets are as you move through your home. What can I do in the bathroom?     Keep the floor dry. Clean up any water on the floor right away. Remove soap buildup in the bathtub or shower. Buildup makes bathtubs and showers slippery. Use non-skid mats or decals on the floor of the bathtub or shower. Attach bath mats securely with double-sided, non-slip rug tape. If you need to sit down in the shower, use a non-slip stool. Install grab bars by the toilet and in the bathtub and shower. Do not use towel bars as grab bars. What can I do in the bedroom? Make sure that you have a light by your bed that is easy to reach. Do not use any sheets or blankets on your bed that hang to the floor. Have a firm chair or bench with side arms that you can use for support when you get dressed. What can I do in the kitchen? Clean up any spills right away. If you need to reach something  above you, use a step stool with a grab bar. Keep electrical cords out of the way. Do not use floor polish or wax that makes floors slippery. What can I do with my stairs? Do not leave anything on the stairs. Make sure that you have a light switch at the top and the bottom of the stairs. Make sure that there are handrails on both sides of the stairs. Fix handrails that are broken or loose. Install non-slip stair treads on all your stairs if they do not have carpet. Avoid having throw rugs at the top or bottom of the stairs. Choose a carpet that does not hide the edge of the steps on the stairs. Make sure that the carpet is firmly attached to the stairs. Fix carpet that is loose or worn. What can I do on the outside of my home? Use bright outdoor lighting. Fix the edges of walkways and driveways and fix any cracks. Clear paths of anything that can make you trip, such as tools or rocks. Add color or contrast paint or tape to clearly mark and help you see anything that might make you trip as you walk through a door, such as a raised step or threshold. Trim any bushes or trees on paths to your home. Check to see if handrails are loose  or broken and that both sides of all steps have handrails. Install guardrails along the edges of any raised decks and porches. Have leaves, snow, or ice cleared regularly. Use sand, salt, or ice melter on paths if you live where there is ice and snow during the winter. Clean up any spills in your garage right away. This includes grease or oil spills. What other actions can I take? Review your medicines with your doctor. Some medicines can cause dizziness or changes in blood pressure, which increase your risk of falling. Wear shoes that: Have a low heel. Do not wear high heels. Have rubber bottoms and are closed at the toe. Feel good on your feet and fit well. Use tools that help you move around if needed. These include: Canes. Walkers. Scooters. Crutches. Ask  your doctor what else you can do to help prevent falls. This may include seeing a physical therapist to learn to do exercises to move better and get stronger. Where to find more information Centers for Disease Control and Prevention, [employer]: TonerPromos.no [employer] on Aging: BaseRingTones.pl National Institute on Aging: BaseRingTones.pl Contact a doctor if: You are afraid of falling at home. You feel weak, drowsy, or dizzy at home. You fall at home. Get help right away if you: Lose consciousness or have trouble moving after a fall. Have a fall that causes a head injury. These symptoms may be an emergency. Get help right away. Call 911. Do not wait to see if the symptoms will go away. Do not drive yourself to the hospital. This information is not intended to replace advice given to you by your health care provider. Make sure you discuss any questions you have with your health care provider. Document Revised: 01/19/2022 Document Reviewed: 01/19/2022 Elsevier Patient Education  2024 [employer].

## 2024-05-22 ENCOUNTER — Ambulatory Visit: Admitting: Obstetrics & Gynecology

## 2024-05-22 ENCOUNTER — Encounter: Payer: Self-pay | Admitting: Obstetrics & Gynecology

## 2024-05-22 VITALS — BP 132/81 | HR 65 | Ht 62.0 in | Wt 221.0 lb

## 2024-05-22 DIAGNOSIS — Z01419 Encounter for gynecological examination (general) (routine) without abnormal findings: Secondary | ICD-10-CM

## 2024-05-22 DIAGNOSIS — Z87412 Personal history of vulvar dysplasia: Secondary | ICD-10-CM

## 2024-05-22 NOTE — Progress Notes (Signed)
 Subjective:     [name] is a 68 y.o. female here for a routine exam.  No LMP recorded. Patient has had a hysterectomy. G1P0 Birth Control Method:  hysterectomy supercervical 04/2002 Menstrual Calendar(currently): amenorrhea  Current complaints: none.   Current acute medical issues:  ASCVD   Recent Gynecologic History No LMP recorded. Patient has had a hysterectomy. 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   Chronic hoarseness    Colon cancer (HCC) 2014   Sigmoidectomy   Dysphagia    Hyperlipidemia    Hypertension    Hypertriglyceridemia     Past Surgical History:  Procedure Laterality Date   COLON RESECTION     COLONOSCOPY     04/2020   CORONARY STENT INTERVENTION N/A 04/10/2022   Procedure: CORONARY STENT INTERVENTION;  Surgeon: Burnard Debby LABOR, MD;  Location: [facility];  Service: Cardiovascular;  Laterality: N/A;   KNEE SURGERY     LEFT HEART CATH AND CORONARY ANGIOGRAPHY N/A 04/10/2022   Procedure: LEFT HEART CATH AND CORONARY ANGIOGRAPHY;  Surgeon: Burnard Debby LABOR, MD;  Location: [facility];  Service: Cardiovascular;  Laterality: N/A;   oral surgery tooth extraction     TONSILLECTOMY     TOTAL ABDOMINAL HYSTERECTOMY     VULVECTOMY PARTIAL Bilateral 03/30/2018   Procedure: LASER PARTIAL VULVECTOMY FOR VULVUL INTRAEPITHELIA NEOPLASIS III;  Surgeon: Jayne Vonn DEL, MD;  Location: [facility];  Service: Gynecology;  Laterality: Bilateral;    OB History     Gravida  1   Para      Term      Preterm      AB      Living  1      SAB      IAB      Ectopic      Multiple      Live Births  1           Social History   Socioeconomic History   Marital status: Married    Spouse name: Not on file   Number of children: 1   Years of  education: Not on file   Highest education level: Not on file  Occupational History    Employer: [employer]  Tobacco Use   Smoking status: Some Days    Current packs/day: 0.25    Average packs/day: 0.3 packs/day for 40.0 years (10.0 ttl pk-yrs)    Types: Cigarettes    Passive exposure: Never   Smokeless tobacco: Never  Vaping Use   Vaping status: Never Used  Substance and Sexual Activity   Alcohol use: Not Currently   Drug use: No   Sexual activity: Not Currently    Birth control/protection: Surgical    Comment: hyst  Other Topics Concern   Not on file  Social History Narrative   Not on file   Social Drivers of Health   Tobacco Use: High Risk (05/22/2024)   Patient History    Smoking Tobacco Use: Some Days    Smokeless Tobacco Use: Never    Passive Exposure: Never  Financial Resource Strain: Low Risk (05/11/2024)   Overall Financial Resource Strain (CARDIA)    Difficulty of Paying Living Expenses: Not very hard  Food Insecurity: No Food Insecurity (05/11/2024)   Epic    Worried About Programme Researcher, Broadcasting/film/video in the Last Year: Never true    Ran Out of Food in the Last Year: Never true  Transportation Needs: No Transportation Needs (05/11/2024)   Epic    Lack of Transportation (Medical): No    Lack of Transportation (Non-Medical): No  Physical Activity: Inactive (05/11/2024)   Exercise Vital Sign    Days of Exercise per Week: 0 days    Minutes of Exercise per Session: Not on file  Stress: Patient Declined (05/11/2024)   [employer] of Occupational Health - Occupational Stress Questionnaire    Feeling of Stress: Patient declined  Social Connections: Moderately Isolated (05/11/2024)   Social Connection and Isolation Panel    Frequency of Communication with Friends and Family: More than three times a week    Frequency of Social Gatherings with Friends and Family: Three times a week    Attends Religious Services: Patient declined    Active Member of Clubs or  Organizations: No    Attends Banker Meetings: Not on file    Marital Status: Married  Depression (PHQ2-9): Low Risk (05/15/2024)   Depression (PHQ2-9)    PHQ-2 Score: 4  Alcohol Screen: Not on file  Housing: Low Risk (05/11/2024)   Epic    Unable to Pay for Housing in the Last Year: No    Number of Times Moved in the Last Year: 0    Homeless in the Last Year: No  Utilities: Not At Risk (04/10/2022)   [facility] Utilities    Threatened with loss of utilities: No  Health Literacy: Not on file    Family History  Problem Relation Age of Onset   Cancer Maternal Grandfather        lung   Heart attack Father    Heart failure Father    Cancer Mother 97       breast    Cancer Maternal Aunt 40       pancreas   Mental illness Daughter     Current Medications[1]  Review of Systems  Review of Systems  Constitutional: Negative for fever, chills, weight loss, malaise/fatigue and diaphoresis.  HENT: Negative for hearing loss, ear pain, nosebleeds, congestion, sore throat, neck pain, tinnitus and ear discharge.   Eyes: Negative for blurred vision, double vision, photophobia, pain, discharge and redness.  Respiratory: Negative for cough, hemoptysis, sputum production, shortness of breath, wheezing and stridor.   Cardiovascular: Negative for chest pain, palpitations, orthopnea, claudication, leg swelling and PND.  Gastrointestinal: negative for abdominal pain. Negative for heartburn, nausea, vomiting, diarrhea, constipation, blood in stool and melena.  Genitourinary: Negative for dysuria, urgency, frequency, hematuria and flank pain.  Musculoskeletal: Negative for myalgias, back pain, joint pain and falls.  Skin: Negative for itching and rash.  Neurological: Negative for dizziness, tingling, tremors, sensory change, speech change, focal weakness, seizures, loss of consciousness, weakness and headaches.  Endo/Heme/Allergies: Negative for environmental allergies and polydipsia. Does  not bruise/bleed easily.  Psychiatric/Behavioral: Negative for depression, suicidal ideas, hallucinations, memory loss and substance abuse. The patient is not nervous/anxious and does not have insomnia.        Objective:  Blood pressure 132/81, pulse 65, height 5' 2 (1.575 m), weight 221 lb (100.2 kg).   Physical Exam  Vitals reviewed. Constitutional: She is oriented to person, place, and time. She appears well-developed and well-nourished.  HENT:  Head: Normocephalic and atraumatic.  Right Ear: External ear normal.  Left Ear: External ear normal.  Nose: Nose normal.  Mouth/Throat: Oropharynx is clear and moist.  Eyes: Conjunctivae and EOM are normal. Pupils are equal, round, and reactive to light. Right eye exhibits no discharge. Left eye exhibits no discharge. No scleral icterus.  Neck: Normal range of motion. Neck supple. No tracheal deviation present. No thyromegaly present.  Cardiovascular: Normal rate, regular rhythm, normal heart sounds and intact distal pulses.  Exam reveals no gallop and no friction rub.   No murmur heard. Respiratory: Effort normal and breath sounds normal. No respiratory distress. She has no wheezes. She has no rales. She exhibits no tenderness.  GI: Soft. Bowel sounds are normal. She exhibits no distension and no mass. There is no tenderness. There is no rebound and no guarding.  Genitourinary:  Breasts no masses skin changes or nipple changes bilaterally      Vulva is normal without lesions NED Vagina is pink moist without discharge Cervix normal in appearance and pap is not done Uterus is absent Adnexa is negative with normal sized ovaries   Musculoskeletal: Normal range of motion. She exhibits no edema and no tenderness.  Neurological: She is alert and oriented to person, place, and time. She has normal reflexes. She displays normal reflexes. No cranial nerve deficit. She exhibits normal muscle tone. Coordination normal.  Skin: Skin is warm and  dry. No rash noted. No erythema. No pallor.  Psychiatric: She has a normal mood and affect. Her behavior is normal. Judgment and thought content normal.       Medications Ordered at today's visit: No orders of the defined types were placed in this encounter.   Other orders placed at today's visit: No orders of the defined types were placed in this encounter.    ASSESSMENT + PLAN:    ICD-10-CM   1. Well woman exam with routine gynecological exam  Z01.419     2. History of vulvar dysplasia: CIS s/p laser vulvectomy by me 03/2018  Z87.412           Return in about 3 years (around 05/23/2027) for Follow up, with Dr Jayne.     [1]  Current Outpatient Medications:    acetaminophen  (TYLENOL ) 500 MG tablet, Take 500 mg by mouth every 6 (six) hours as needed for moderate pain (pain score 4-6)., Disp: , Rfl:    ALPRAZolam  (XANAX ) 0.5 MG tablet, Take 1 tablet (0.5 mg total) by mouth 2 (two) times daily., Disp: 180 tablet, Rfl: 1   amLODipine  (NORVASC ) 5 MG tablet, Take 1 tablet (5 mg total) by mouth daily., Disp: 90 tablet, Rfl: 1   aspirin  EC 81 MG tablet, Take 1 tablet (81 mg total) by mouth daily. Swallow whole., Disp: 90 tablet, Rfl: 3   ezetimibe  (ZETIA ) 10 MG tablet, Take 1 tablet (10 mg total) by mouth daily., Disp: 90 tablet, Rfl: 1   fenofibrate  160 MG tablet, Take 1 tablet (160 mg total) by mouth daily., Disp: 90 tablet, Rfl: 1   hydrocortisone cream 1 %, Apply 1 Application topically 2 (two) times daily as needed for itching., Disp: , Rfl:    isosorbide  mononitrate (IMDUR ) 30 MG 24 hr tablet, Take 1 tablet (30 mg total) by mouth daily., Disp: 90 tablet, Rfl: 1   levothyroxine  (SYNTHROID ) 75 MCG tablet, TAKE 1 TABLET EVERY DAY BEFORE BREAKFAST, Disp: 90 tablet, Rfl: 1   losartan  (COZAAR ) 100 MG tablet, Take 1 tablet (100 mg total) by mouth daily., Disp: 90 tablet,  Rfl: 1   metoprolol  tartrate (LOPRESSOR ) 25 MG tablet, Take 0.5 tablets (12.5 mg total) by mouth 2 (two) times  daily., Disp: 90 tablet, Rfl: 1   mupirocin  ointment (BACTROBAN ) 2 %, Apply 1 Application topically 2 (two) times daily as needed., Disp: , Rfl:    nitroGLYCERIN  (NITROSTAT ) 0.4 MG SL tablet, Place 1 tablet (0.4 mg total) under the tongue every 5 (five) minutes as needed for chest pain., Disp: 20 tablet, Rfl: 2   sodium fluoride  (DENTAGEL) 1.1 % GEL dental gel, APPLY 1 APPLICATION ONTO TEETH AT BEDTIME., Disp: 112 g, Rfl: 5

## 2024-06-09 ENCOUNTER — Encounter: Payer: Self-pay | Admitting: Nurse Practitioner

## 2024-06-09 ENCOUNTER — Ambulatory Visit: Payer: Self-pay | Admitting: Nurse Practitioner

## 2024-06-09 VITALS — BP 129/78 | HR 62 | Temp 96.8°F | Ht 62.0 in | Wt 216.0 lb

## 2024-06-09 DIAGNOSIS — F5101 Primary insomnia: Secondary | ICD-10-CM

## 2024-06-09 DIAGNOSIS — F3341 Major depressive disorder, recurrent, in partial remission: Secondary | ICD-10-CM

## 2024-06-09 DIAGNOSIS — I2510 Atherosclerotic heart disease of native coronary artery without angina pectoris: Secondary | ICD-10-CM

## 2024-06-09 DIAGNOSIS — E1169 Type 2 diabetes mellitus with other specified complication: Secondary | ICD-10-CM

## 2024-06-09 DIAGNOSIS — I10 Essential (primary) hypertension: Secondary | ICD-10-CM

## 2024-06-09 DIAGNOSIS — Z9861 Coronary angioplasty status: Secondary | ICD-10-CM

## 2024-06-09 DIAGNOSIS — E039 Hypothyroidism, unspecified: Secondary | ICD-10-CM

## 2024-06-09 DIAGNOSIS — E785 Hyperlipidemia, unspecified: Secondary | ICD-10-CM

## 2024-06-09 DIAGNOSIS — I209 Angina pectoris, unspecified: Secondary | ICD-10-CM

## 2024-06-09 DIAGNOSIS — Z6833 Body mass index (BMI) 33.0-33.9, adult: Secondary | ICD-10-CM

## 2024-06-09 DIAGNOSIS — F419 Anxiety disorder, unspecified: Secondary | ICD-10-CM

## 2024-06-09 DIAGNOSIS — F410 Panic disorder [episodic paroxysmal anxiety] without agoraphobia: Secondary | ICD-10-CM

## 2024-06-09 LAB — BAYER DCA HB A1C WAIVED: HB A1C (BAYER DCA - WAIVED): 6.5 % — ABNORMAL HIGH (ref 4.8–5.6)

## 2024-06-09 LAB — LIPID PANEL

## 2024-06-09 MED ORDER — EZETIMIBE 10 MG PO TABS: 10.0000 mg | ORAL_TABLET | Freq: Every day | ORAL | 1 refills | Status: AC

## 2024-06-09 MED ORDER — FENOFIBRATE 160 MG PO TABS: 160.0000 mg | ORAL_TABLET | Freq: Every day | ORAL | 1 refills | Status: DC

## 2024-06-09 MED ORDER — ASPIRIN 81 MG PO TBEC: 81.0000 mg | DELAYED_RELEASE_TABLET | Freq: Every day | ORAL | 1 refills | Status: AC

## 2024-06-09 MED ORDER — ALPRAZOLAM 0.5 MG PO TABS: 0.5000 mg | ORAL_TABLET | Freq: Two times a day (BID) | ORAL | 1 refills | Status: AC

## 2024-06-09 MED ORDER — LOSARTAN POTASSIUM 100 MG PO TABS: 100.0000 mg | ORAL_TABLET | Freq: Every day | ORAL | 1 refills | Status: AC

## 2024-06-09 MED ORDER — LEVOTHYROXINE SODIUM 75 MCG PO TABS: ORAL_TABLET | ORAL | 1 refills | Status: DC

## 2024-06-09 MED ORDER — METOPROLOL TARTRATE 25 MG PO TABS: 12.5000 mg | ORAL_TABLET | Freq: Two times a day (BID) | ORAL | 1 refills | Status: AC

## 2024-06-09 MED ORDER — NITROGLYCERIN 0.4 MG SL SUBL: 0.4000 mg | SUBLINGUAL_TABLET | SUBLINGUAL | 2 refills | Status: AC | PRN

## 2024-06-09 MED ORDER — ISOSORBIDE MONONITRATE ER 30 MG PO TB24: 30.0000 mg | ORAL_TABLET | Freq: Every day | ORAL | 1 refills | Status: DC

## 2024-06-09 MED ORDER — AMLODIPINE BESYLATE 5 MG PO TABS: 5.0000 mg | ORAL_TABLET | Freq: Every day | ORAL | 1 refills | Status: AC

## 2024-06-09 NOTE — Progress Notes (Signed)
 "  Subjective:    Patient ID: [name], female    DOB: 1955-08-15, 69 y.o.   MRN: [id number]   Chief Complaint: medical management of chronic issues     HPI:  [name] is a 69 y.o. who identifies as a female who was assigned female at birth.   Social history: Lives with: husband Work history: retired   Water Engineer in today for follow up of the following chronic medical issues:  1. Essential hypertension No c/o chest pain, sob or headache. Does not check blood pressure at home. BP Readings from Last 3 Encounters:  05/22/24 132/81  05/15/24 116/73  02/15/24 132/80    2. Angina pectoris (HCC) No recent chest pain  3. CAD S/P percutaneous coronary angioplasty Last saw cardiology in 02/15/24. Review of office note showed no change in plan of care. Has follow up in September.  4. Hyperlipidemia LDL goal <70 Does not watch diet and does no exercise. Refused statin therapy for years but did start on crestor  earlier this year.  But has already stopped taking due to caused left knee pain. Refuses repatha. Lab Results  Component Value Date   CHOL 188 12/09/2023   HDL 36 (L) 12/09/2023   LDLCALC 117 (H) 12/09/2023   TRIG 201 (H) 12/09/2023   CHOLHDL 5.2 (H) 12/09/2023   The 10-year ASCVD risk score (Arnett DK, et al., 2019) is: 21.7%   5. Acquired hypothyroidism No issues that she is aware of. Lab Results  Component Value Date   TSH 4.390 06/11/2023     6. Diet-controlled diabetes mellitus (HCC) Doe snot check blood sugars very often at home. Lab Results  Component Value Date   HGBA1C 6.5 (H) 12/09/2023  '  7. Squamous cell carcinoma of supraglottis (HCC) This was back in 2013 and has been doing well. No reoccurrence.  8. Anxiety Has a very anxious personality. Worries about everything. Takes xanax  BID.    06/09/2024   11:10 AM 05/15/2024   10:25 AM 12/09/2023   10:03 AM 06/11/2023   11:20 AM  GAD 7 : Generalized Anxiety Score  Nervous, Anxious, on Edge 1 1  3 3   Control/stop worrying 3 1 3 3   Worry too much - different things 3 1 3 3   Trouble relaxing 0 0 2 0  Restless 0 0 0 0  Easily annoyed or irritable 0 0 0 1  Afraid - awful might happen 3 3 3 3   Total GAD 7 Score 10 6 14 13   Anxiety Difficulty Not difficult at all Not difficult at all Not difficult at all Not difficult at all    9. Recurrent major depressive disorder, in partial remission (HCC) Currently on no anti depressant.    06/09/2024   11:10 AM 05/15/2024   10:25 AM 12/09/2023   10:03 AM  Depression screen PHQ 2/9  Decreased Interest 1 1 0  Down, Depressed, Hopeless 1 1 1   PHQ - 2 Score 2 2 1   Altered sleeping 3 1 3   Tired, decreased energy 1 1 3   Change in appetite 1 0 2  Feeling bad or failure about yourself  0 0 0  Trouble concentrating 0 0 1  Moving slowly or fidgety/restless 0 0 0  Suicidal thoughts 0 0 0  PHQ-9 Score 7 4 10    Difficult doing work/chores Not difficult at all Not difficult at all Not difficult at all     Data saved with a previous flowsheet row definition  10. Primary insomnia Doe snot want to take anything because afraid she wont wake up if her husband needs her.  11. Panic attacks No recent panic attacks  12. BMI 40.0-40.9,adult Weight is down 5lbs  Wt Readings from Last 3 Encounters:  06/09/24 216 lb (98 kg)  05/22/24 221 lb (100.2 kg)  05/15/24 221 lb (100.2 kg)   BMI Readings from Last 3 Encounters:  06/09/24 39.51 kg/m  05/22/24 40.42 kg/m  05/15/24 40.42 kg/m      New complaints: None today  Allergies  Allergen Reactions   Azithromycin  Diarrhea   Lisinopril  Cough   Statins Other (See Comments)    myopathy   Sulfa Antibiotics Other (See Comments)    Unable to recall   Outpatient Encounter Medications as of 06/09/2024  Medication Sig   acetaminophen  (TYLENOL ) 500 MG tablet Take 500 mg by mouth every 6 (six) hours as needed for moderate pain (pain score 4-6).   ALPRAZolam  (XANAX ) 0.5 MG tablet Take 1  tablet (0.5 mg total) by mouth 2 (two) times daily.   amLODipine  (NORVASC ) 5 MG tablet Take 1 tablet (5 mg total) by mouth daily.   aspirin  EC 81 MG tablet Take 1 tablet (81 mg total) by mouth daily. Swallow whole.   ezetimibe  (ZETIA ) 10 MG tablet Take 1 tablet (10 mg total) by mouth daily.   fenofibrate  160 MG tablet Take 1 tablet (160 mg total) by mouth daily.   hydrocortisone cream 1 % Apply 1 Application topically 2 (two) times daily as needed for itching.   isosorbide  mononitrate (IMDUR ) 30 MG 24 hr tablet Take 1 tablet (30 mg total) by mouth daily.   levothyroxine  (SYNTHROID ) 75 MCG tablet TAKE 1 TABLET EVERY DAY BEFORE BREAKFAST   losartan  (COZAAR ) 100 MG tablet Take 1 tablet (100 mg total) by mouth daily.   metoprolol  tartrate (LOPRESSOR ) 25 MG tablet Take 0.5 tablets (12.5 mg total) by mouth 2 (two) times daily.   mupirocin  ointment (BACTROBAN ) 2 % Apply 1 Application topically 2 (two) times daily as needed.   nitroGLYCERIN  (NITROSTAT ) 0.4 MG SL tablet Place 1 tablet (0.4 mg total) under the tongue every 5 (five) minutes as needed for chest pain.   sodium fluoride  (DENTAGEL) 1.1 % GEL dental gel APPLY 1 APPLICATION ONTO TEETH AT BEDTIME.   No facility-administered encounter medications on file as of 06/09/2024.    Past Surgical History:  Procedure Laterality Date   COLON RESECTION     COLONOSCOPY     04/2020   CORONARY STENT INTERVENTION N/A 04/10/2022   Procedure: CORONARY STENT INTERVENTION;  Surgeon: Burnard Debby LABOR, MD;  Location: [facility];  Service: Cardiovascular;  Laterality: N/A;   KNEE SURGERY     LEFT HEART CATH AND CORONARY ANGIOGRAPHY N/A 04/10/2022   Procedure: LEFT HEART CATH AND CORONARY ANGIOGRAPHY;  Surgeon: Burnard Debby LABOR, MD;  Location: [facility];  Service: Cardiovascular;  Laterality: N/A;   oral surgery tooth extraction     TONSILLECTOMY     TOTAL ABDOMINAL HYSTERECTOMY     VULVECTOMY PARTIAL Bilateral 03/30/2018   Procedure: LASER  PARTIAL VULVECTOMY FOR VULVUL INTRAEPITHELIA NEOPLASIS III;  Surgeon: Jayne Vonn DEL, MD;  Location: [facility];  Service: Gynecology;  Laterality: Bilateral;    Family History  Problem Relation Age of Onset   Cancer Maternal Grandfather        lung   Heart attack Father    Heart failure Father    Cancer Mother 93  breast    Cancer Maternal Aunt 40       pancreas   Mental illness Daughter       Controlled substance contract: 12/07/22      Review of Systems  Constitutional:  Negative for diaphoresis.  Eyes:  Negative for pain.  Respiratory:  Negative for shortness of breath.   Cardiovascular:  Negative for chest pain, palpitations and leg swelling.  Gastrointestinal:  Negative for abdominal pain.  Endocrine: Negative for polydipsia.  Skin:  Negative for rash.  Neurological:  Negative for dizziness, weakness and headaches.  Hematological:  Does not bruise/bleed easily.  All other systems reviewed and are negative.      Objective:   Physical Exam Vitals and nursing note reviewed.  Constitutional:      General: She is not in acute distress.    Appearance: Normal appearance. She is well-developed.  HENT:     Head: Normocephalic.     Right Ear: Tympanic membrane normal.     Left Ear: Tympanic membrane normal.     Nose: Nose normal.     Mouth/Throat:     Mouth: Mucous membranes are moist.  Eyes:     Pupils: Pupils are equal, round, and reactive to light.  Neck:     Vascular: No carotid bruit or JVD.  Cardiovascular:     Rate and Rhythm: Normal rate and regular rhythm.     Heart sounds: Normal heart sounds.  Pulmonary:     Effort: Pulmonary effort is normal. No respiratory distress.     Breath sounds: Normal breath sounds. No wheezing or rales.  Chest:     Chest wall: No tenderness.  Abdominal:     General: Bowel sounds are normal. There is no distension or abdominal bruit.     Palpations: Abdomen is soft. There is no hepatomegaly, splenomegaly, mass or  pulsatile mass.     Tenderness: There is no abdominal tenderness.  Musculoskeletal:        General: Normal range of motion.     Cervical back: Normal range of motion and neck supple.  Lymphadenopathy:     Cervical: No cervical adenopathy.  Skin:    General: Skin is warm and dry.  Neurological:     Mental Status: She is alert and oriented to person, place, and time.     Deep Tendon Reflexes: Reflexes are normal and symmetric.  Psychiatric:        Behavior: Behavior normal.        Thought Content: Thought content normal.        Judgment: Judgment normal.     BP 129/78   Pulse 62   Temp (!) 96.8 F (36 C) (Temporal)   Ht 5' 2 (1.575 m)   Wt 216 lb (98 kg)   SpO2 96%   BMI 39.51 kg/m   Hgba1c 6.5%       Assessment & Plan:  CYANNA NEACE comes in today with chief complaint of No chief complaint on file.   Diagnosis and orders addressed:  1. Essential hypertension Low sodium - losartan  (COZAAR ) 100 MG tablet; Take 1 tablet (100 mg total) by mouth daily.  Dispense: 90 tablet; Refill: 1  2. Angina pectoris (HCC) Report any chest pain  3. CAD S/P percutaneous coronary angioplasty  4. Hyperlipidemia LDL goal <70 Low fat diet  - fenofibrate  160 MG tablet; Take 1 tablet (160 mg total) by mouth daily.  Dispense: 90 tablet; Refill: 1    5. Acquired hypothyroidism Labs pending -  levothyroxine  (SYNTHROID ) 75 MCG tablet; TAKE 1 TABLET EVERY DAY BEFORE BREAKFAST  Dispense: 90 tablet; Refill: 1  6. Diet-controlled diabetes mellitus (HCC) Continue to watch carbs in diet  7. Squamous cell carcinoma of supraglottis (HCC) No reoccurrence  8. Anxiety Stress management - ALPRAZolam  (XANAX ) 0.5 MG tablet; Take 1 tablet (0.5 mg total) by mouth 2 (two) times daily.  Dispense: 180 tablet; Refill: 1  9. Recurrent major depressive disorder, in partial remission (HCC)  10. Primary insomnia Bedtime routine  11. Panic attacks  12. BMI 33.0-33.9,adult Discussed diet and  exercise for person with BMI >25 Will recheck weight in 3-6 months  Labs pending Health Maintenance reviewed Diet and exercise encouraged  Follow up plan: 6 months   Mary-Margaret Gladis, FNP   "

## 2024-06-09 NOTE — Patient Instructions (Signed)

## 2024-06-10 LAB — CBC WITH DIFFERENTIAL/PLATELET
Basophils Absolute: 0 x10E3/uL (ref 0.0–0.2)
Basos: 1 %
EOS (ABSOLUTE): 0.1 x10E3/uL (ref 0.0–0.4)
Eos: 2 %
Hematocrit: 42.5 % (ref 34.0–46.6)
Hemoglobin: 13.6 g/dL (ref 11.1–15.9)
Immature Grans (Abs): 0 x10E3/uL (ref 0.0–0.1)
Immature Granulocytes: 0 %
Lymphocytes Absolute: 1.2 x10E3/uL (ref 0.7–3.1)
Lymphs: 22 %
MCH: 29.4 pg (ref 26.6–33.0)
MCHC: 32 g/dL (ref 31.5–35.7)
MCV: 92 fL (ref 79–97)
Monocytes Absolute: 0.4 x10E3/uL (ref 0.1–0.9)
Monocytes: 8 %
Neutrophils Absolute: 3.7 x10E3/uL (ref 1.4–7.0)
Neutrophils: 67 %
Platelets: 242 x10E3/uL (ref 150–450)
RBC: 4.62 x10E6/uL (ref 3.77–5.28)
RDW: 13.5 % (ref 11.7–15.4)
WBC: 5.4 x10E3/uL (ref 3.4–10.8)

## 2024-06-10 LAB — MICROALBUMIN / CREATININE URINE RATIO
Creatinine, Urine: 132 mg/dL
Microalb/Creat Ratio: 111 mg/g{creat} — ABNORMAL HIGH (ref 0–29)
Microalbumin, Urine: 146.6 ug/mL

## 2024-06-10 LAB — CMP14+EGFR
ALT: 26 IU/L (ref 0–32)
AST: 18 IU/L (ref 0–40)
Albumin: 4.6 g/dL (ref 3.9–4.9)
Alkaline Phosphatase: 48 IU/L — AB (ref 49–135)
BUN/Creatinine Ratio: 16 (ref 12–28)
BUN: 18 mg/dL (ref 8–27)
Bilirubin Total: 0.2 mg/dL (ref 0.0–1.2)
CO2: 21 mmol/L (ref 20–29)
Calcium: 10.1 mg/dL (ref 8.7–10.3)
Chloride: 104 mmol/L (ref 96–106)
Creatinine, Ser: 1.16 mg/dL — AB (ref 0.57–1.00)
Globulin, Total: 2 g/dL (ref 1.5–4.5)
Glucose: 115 mg/dL — AB (ref 70–99)
Potassium: 4.6 mmol/L (ref 3.5–5.2)
Sodium: 140 mmol/L (ref 134–144)
Total Protein: 6.6 g/dL (ref 6.0–8.5)
eGFR: 51 mL/min/1.73 — AB

## 2024-06-10 LAB — LIPID PANEL
Cholesterol, Total: 168 mg/dL (ref 100–199)
HDL: 35 mg/dL — AB
LDL CALC COMMENT:: 4.8 ratio — AB (ref 0.0–4.4)
LDL Chol Calc (NIH): 102 mg/dL — AB (ref 0–99)
Triglycerides: 179 mg/dL — AB (ref 0–149)
VLDL Cholesterol Cal: 31 mg/dL (ref 5–40)

## 2024-06-12 ENCOUNTER — Ambulatory Visit: Payer: Self-pay | Admitting: Nurse Practitioner

## 2024-06-16 ENCOUNTER — Ambulatory Visit: Admitting: Family Medicine

## 2024-06-16 ENCOUNTER — Encounter: Payer: Self-pay | Admitting: Family Medicine

## 2024-06-16 ENCOUNTER — Ambulatory Visit: Payer: Self-pay

## 2024-06-16 VITALS — BP 163/67 | HR 80 | Temp 97.2°F | Ht 62.0 in | Wt 217.0 lb

## 2024-06-16 DIAGNOSIS — R350 Frequency of micturition: Secondary | ICD-10-CM

## 2024-06-16 LAB — URINALYSIS, COMPLETE
Bilirubin, UA: NEGATIVE
Glucose, UA: NEGATIVE
Ketones, UA: NEGATIVE
Nitrite, UA: NEGATIVE
RBC, UA: NEGATIVE
Specific Gravity, UA: <1.005 — ABNORMAL LOW (ref 1.005–1.030)
Urobilinogen, Ur: 0.2 mg/dL (ref 0.2–1.0)
pH, UA: 6.5 (ref 5.0–7.5)

## 2024-06-16 LAB — MICROSCOPIC EXAMINATION: Bacteria, UA: NONE SEEN

## 2024-06-16 MED ORDER — CEPHALEXIN 500 MG PO CAPS: 500.0000 mg | ORAL_CAPSULE | Freq: Four times a day (QID) | ORAL | 0 refills | Status: AC

## 2024-06-16 NOTE — Telephone Encounter (Signed)
 FYI Only or Action Required?: Action required by provider: No available appointments, Urgent Care recommended and patient refused that at this time--patient wanted an appointment next week but was advised Urgent Care today.  Patient was last seen in primary care on 06/09/2024 by Gladis Mustard, FNP.  Called Nurse Triage reporting Urinary Frequency.  Symptoms began about two weeks ago.  Interventions attempted: Rest, hydration, or home remedies.  Symptoms are: gradually worsening.  Triage Disposition: See HCP Within 4 Hours (Or PCP Triage)  Patient/caregiver understands and will follow disposition?: No, wishes to speak with PCP              Message from Graeme ORN sent at 06/16/2024 11:37 AM EST  Summary: frequent urination   Reason for Triage: Smell/odor like ammonia, frequent urination, feels like not able to finish.      Reason for Disposition  Side (flank) or lower back pain present  Answer Assessment - Initial Assessment Questions Urinary symptoms for two weeks---patient noticed odor in urine today Back pain mild and off and on---patient states this is very mild Patient states urinary frequency off and on for a long time Urgency, frequency, urgency---she is able to eliminate urine but states it just doesn't feel like her bladder empties completely and she has to go back to urinate again. Strong odor in urine--patient noticed this today--patient states she thought it smelled like ammonia  Patient has been drinking parsley tea & green tea, cranberry juice, water   Patient denies any known fevers, retaining urine and feeling like bladder is very full, or any other symptoms other than those she reported.  There are no available appointments at the patient's PCP office or within the surrounding offices within the region.  Patient is advised Urgent Care at this time.  Patient did not want to go to Urgent Care--she states she didn't think it was that bad. She was  encouraged to be seen at Urgent Care but still didn't want to at this time. She wanted an appointment with her PCP office sometime next week.   Patient is advised to call back if anything changes or if they have any further questions/concerns.  Patient is also advised that if symptoms worse, to go to the Emergency Room or call 911.  Patient verbalized understanding.  Protocols used: Urinary Symptoms-A-AH

## 2024-06-16 NOTE — Progress Notes (Signed)
 "  BP (!) 163/67   Pulse 80   Temp (!) 97.2 F (36.2 C)   Ht 5' 2 (1.575 m)   Wt 217 lb (98.4 kg)   SpO2 96%   BMI 39.69 kg/m    Subjective:   Patient ID: [name], female    DOB: 1955/09/12, 69 y.o.   MRN: [id number]  HPI: [name] is a 69 y.o. female presenting on 06/16/2024 for Urinary Tract Infection (Increase urgency), Back Pain, and Pelvic Pain   Discussed the use of AI scribe software for clinical note transcription with the patient, who gave verbal consent to proceed.  History of Present Illness   [name] is a 69 year old female who presents with urinary frequency and urgency.  Urinary frequency and urgency - Very frequent urination, waking every hour and a half at night to urinate - Urgency to urinate shortly after voiding - No dysuria - No hematuria - Urine is clear - No fevers or chills - Wears pads due to occasional gas from previous colonoscopy - Strong ammonia smell noted in urine  Perineal and abdominal pressure - Pressure sensation in the perineal region, similar to post-catheter removal - History of hernia repair in 2014 - Occasional soreness and pressure at hernia site, especially with bowel movements - Pain located on her side and sometimes in the lower back area  Headache - Small headaches attributed to isosorbide  medication for heart condition - Headaches typically resolve with a cup of coffee          Relevant past medical, surgical, family and social history reviewed and updated as indicated. Interim medical history since our last visit reviewed. Allergies and medications reviewed and updated.  Review of Systems  Constitutional:  Negative for chills and fever.  Eyes:  Negative for visual disturbance.  Respiratory:  Negative for chest tightness and shortness of breath.   Cardiovascular:  Negative for chest pain and leg swelling.  Gastrointestinal:  Positive for abdominal pain.  Genitourinary:  Positive for frequency and  urgency. Negative for difficulty urinating, dysuria, hematuria, vaginal bleeding, vaginal discharge and vaginal pain.  Musculoskeletal:  Negative for back pain and gait problem.  Skin:  Negative for rash.  Neurological:  Negative for light-headedness and headaches.  Psychiatric/Behavioral:  Negative for agitation and behavioral problems.   All other systems reviewed and are negative.   Per HPI unless specifically indicated above   Allergies as of 06/16/2024       Reactions   Azithromycin  Diarrhea   Lisinopril  Cough   Statins Other (See Comments)   myopathy   Sulfa Antibiotics Other (See Comments)   Unable to recall        Medication List        Accurate as of June 16, 2024  2:38 PM. If you have any questions, ask your nurse or doctor.          acetaminophen  500 MG tablet Commonly known as: TYLENOL  Take 500 mg by mouth every 6 (six) hours as needed for moderate pain (pain score 4-6).   ALPRAZolam  0.5 MG tablet Commonly known as: XANAX  Take 1 tablet (0.5 mg total) by mouth 2 (two) times daily.   amLODipine  5 MG tablet Commonly known as: NORVASC  Take 1 tablet (5 mg total) by mouth daily.   aspirin  EC 81 MG tablet Take 1 tablet (81 mg total) by mouth daily. Swallow whole.   cephALEXin  500 MG capsule Commonly known as: KEFLEX  Take 1 capsule (500 mg  total) by mouth 4 (four) times daily for 7 days. Started by: Fonda Levins, MD   ezetimibe  10 MG tablet Commonly known as: ZETIA  Take 1 tablet (10 mg total) by mouth daily.   fenofibrate  160 MG tablet Take 1 tablet (160 mg total) by mouth daily.   hydrocortisone cream 1 % Apply 1 Application topically 2 (two) times daily as needed for itching.   isosorbide  mononitrate 30 MG 24 hr tablet Commonly known as: IMDUR  Take 1 tablet (30 mg total) by mouth daily.   levothyroxine  75 MCG tablet Commonly known as: SYNTHROID  TAKE 1 TABLET EVERY DAY BEFORE BREAKFAST   losartan  100 MG tablet Commonly known as:  COZAAR  Take 1 tablet (100 mg total) by mouth daily.   metoprolol  tartrate 25 MG tablet Commonly known as: LOPRESSOR  Take 0.5 tablets (12.5 mg total) by mouth 2 (two) times daily.   mupirocin  ointment 2 % Commonly known as: BACTROBAN  Apply 1 Application topically 2 (two) times daily as needed.   nitroGLYCERIN  0.4 MG SL tablet Commonly known as: NITROSTAT  Place 1 tablet (0.4 mg total) under the tongue every 5 (five) minutes as needed for chest pain.   sodium fluoride  1.1 % Gel dental gel Commonly known as: DentaGel APPLY 1 APPLICATION ONTO TEETH AT BEDTIME.         Objective:   BP (!) 163/67   Pulse 80   Temp (!) 97.2 F (36.2 C)   Ht 5' 2 (1.575 m)   Wt 217 lb (98.4 kg)   SpO2 96%   BMI 39.69 kg/m   Wt Readings from Last 3 Encounters:  06/16/24 217 lb (98.4 kg)  06/09/24 216 lb (98 kg)  05/22/24 221 lb (100.2 kg)    Physical Exam Vitals and nursing note reviewed.  Constitutional:      Appearance: Normal appearance. She is obese.  Abdominal:     General: Abdomen is flat. Bowel sounds are normal. There is no distension.     Palpations: Abdomen is soft.     Tenderness: There is abdominal tenderness in the suprapubic area. There is no right CVA tenderness, left CVA tenderness, guarding or rebound.  Neurological:     Mental Status: She is alert.                 Assessment & Plan:   Problem List Items Addressed This Visit   None Visit Diagnoses       Frequency of urination    -  Primary   Relevant Medications   cephALEXin  (KEFLEX ) 500 MG capsule   Other Relevant Orders   Urinalysis, Complete   Urine Culture           Urinary tract infection Frequent urination, urgency, and malodorous urine. Urinalysis shows zero to ten epithelial cells, leukocytes, and protein. Awaiting urine culture results. - Prescribed cephalexin  orally for 7 days. - Advised increased fluid intake. - Sent urine culture.          Follow up plan: Return if symptoms  worsen or fail to improve.  Counseling provided for all of the vaccine components Orders Placed This Encounter  Procedures   Urine Culture   Urinalysis, Complete    Fonda Levins, MD [facility] 06/16/2024, 2:38 PM     "

## 2024-06-16 NOTE — Telephone Encounter (Signed)
 Patient scheduled in after hours with permission to come into clinic in around 2 pm per Dr. Maryanne

## 2024-06-16 NOTE — Telephone Encounter (Signed)
 Called [facility] and spoke with Mitzy about patient refusing Urgent Care at this time.

## 2024-06-18 LAB — URINE CULTURE

## 2024-06-19 ENCOUNTER — Ambulatory Visit: Payer: Self-pay | Admitting: Family Medicine

## 2024-06-22 ENCOUNTER — Telehealth: Payer: Self-pay | Admitting: Nurse Practitioner

## 2024-06-22 DIAGNOSIS — E039 Hypothyroidism, unspecified: Secondary | ICD-10-CM

## 2024-06-22 DIAGNOSIS — E785 Hyperlipidemia, unspecified: Secondary | ICD-10-CM

## 2024-06-22 DIAGNOSIS — I2510 Atherosclerotic heart disease of native coronary artery without angina pectoris: Secondary | ICD-10-CM

## 2024-06-22 MED ORDER — LEVOTHYROXINE SODIUM 75 MCG PO TABS: ORAL_TABLET | ORAL | 0 refills | Status: AC

## 2024-06-22 MED ORDER — FENOFIBRATE 160 MG PO TABS: 160.0000 mg | ORAL_TABLET | Freq: Every day | ORAL | 0 refills | Status: AC

## 2024-06-22 MED ORDER — ISOSORBIDE MONONITRATE ER 30 MG PO TB24: 30.0000 mg | ORAL_TABLET | Freq: Every day | ORAL | 0 refills | Status: AC

## 2024-06-22 NOTE — Telephone Encounter (Signed)
Meds sent to [facility] per patient's request.

## 2024-06-22 NOTE — Telephone Encounter (Signed)
 [employer] has lost patient's medication and she needs a RX of Levothyroxine  75 mg, Fenofibrate  160 mgm Isosorbide  mononitrate 30 mg [facility] [place]. [facility] told [facility] would need a RX called in.

## 2024-06-22 NOTE — Telephone Encounter (Signed)
 Copied from CRM [id number]. Topic: Clinical - Medication Refill >> Jun 22, 2024  8:29 AM Diannia H wrote: Medication: levothyroxine  (SYNTHROID ) 75 MCG tablet,isosorbide  mononitrate (IMDUR ) 30 MG, fenofibrate  160 MG tablet  Has the patient contacted their pharmacy? Yes (Agent: If no, request that the patient contact the pharmacy for the refill. If patient does not wish to contact the pharmacy document the reason why and proceed with request.) (Agent: If yes, when and what did the pharmacy advise?)  This is the patient's preferred pharmacy:  [id number] - [place], [place] - [address] [place] [address] Phone: [phone] Fax: [phone]  Is this the correct pharmacy for this prescription? Yes If no, delete pharmacy and type the correct one.   Has the prescription been filled recently? Yes  Is the patient out of the medication? Yes  Has the patient been seen for an appointment in the last year OR does the patient have an upcoming appointment? Yes  Can we respond through MyChart? Yes  Agent: Please be advised that Rx refills may take up to 3 business days. We ask that you follow-up with your pharmacy. >> Jun 22, 2024  8:51 AM Susanna ORN wrote: Patient calling in again requesting that nurse [facility] give her a call back. States she needs to speak with her about the medications that was lost in shipment. CB #: [id number].

## 2024-08-15 ENCOUNTER — Ambulatory Visit: Admitting: Cardiology

## 2024-11-28 ENCOUNTER — Ambulatory Visit: Admitting: Nurse Practitioner
